# Patient Record
Sex: Female | Born: 2000 | Race: Black or African American | Hispanic: No | Marital: Single | Smoking: Former smoker
Health system: Southern US, Community
[De-identification: ages and names within clinical notes are randomized; demographics above are authoritative.]

## PROBLEM LIST (undated history)

## (undated) DIAGNOSIS — O139 Gestational [pregnancy-induced] hypertension without significant proteinuria, unspecified trimester: Secondary | ICD-10-CM

## (undated) DIAGNOSIS — N83209 Unspecified ovarian cyst, unspecified side: Secondary | ICD-10-CM

## (undated) DIAGNOSIS — T7840XA Allergy, unspecified, initial encounter: Secondary | ICD-10-CM

## (undated) DIAGNOSIS — I340 Nonrheumatic mitral (valve) insufficiency: Secondary | ICD-10-CM

## (undated) DIAGNOSIS — J45909 Unspecified asthma, uncomplicated: Secondary | ICD-10-CM

## (undated) DIAGNOSIS — E669 Obesity, unspecified: Secondary | ICD-10-CM

## (undated) DIAGNOSIS — G43909 Migraine, unspecified, not intractable, without status migrainosus: Secondary | ICD-10-CM

## (undated) HISTORY — DX: Allergy, unspecified, initial encounter: T78.40XA

## (undated) HISTORY — DX: Morbid (severe) obesity due to excess calories: E66.01

## (undated) HISTORY — PX: TYMPANOSTOMY TUBE PLACEMENT: SHX32

## (undated) HISTORY — DX: Migraine, unspecified, not intractable, without status migrainosus: G43.909

## (undated) HISTORY — DX: Obesity, unspecified: E66.9

## (undated) HISTORY — DX: Nonrheumatic mitral (valve) insufficiency: I34.0

---
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
## 2020-01-05 ENCOUNTER — Other Ambulatory Visit: Payer: Self-pay

## 2020-01-05 ENCOUNTER — Ambulatory Visit (INDEPENDENT_AMBULATORY_CARE_PROVIDER_SITE_OTHER): Payer: Medicaid Other | Admitting: Family Medicine

## 2020-01-05 ENCOUNTER — Encounter: Payer: Self-pay | Admitting: Family Medicine

## 2020-01-05 ENCOUNTER — Other Ambulatory Visit (HOSPITAL_COMMUNITY)
Admission: RE | Admit: 2020-01-05 | Discharge: 2020-01-05 | Disposition: A | Discharge status: Home / Self Care | Payer: Medicaid Other | Source: Ambulatory Visit | Attending: Family Medicine | Admitting: Family Medicine

## 2020-01-05 VITALS — BP 110/64 | HR 75 | Ht 72.0 in | Wt 260.4 lb

## 2020-01-05 DIAGNOSIS — Z113 Encounter for screening for infections with a predominantly sexual mode of transmission: Secondary | ICD-10-CM | POA: Insufficient documentation

## 2020-01-05 DIAGNOSIS — Z7689 Persons encountering health services in other specified circumstances: Secondary | ICD-10-CM

## 2020-01-05 DIAGNOSIS — K1379 Other lesions of oral mucosa: Secondary | ICD-10-CM

## 2020-01-05 DIAGNOSIS — R300 Dysuria: Secondary | ICD-10-CM | POA: Diagnosis not present

## 2020-01-05 LAB — POCT URINALYSIS DIP (MANUAL ENTRY)
Glucose, UA: NEGATIVE mg/dL
Ketones, POC UA: NEGATIVE mg/dL
Nitrite, UA: NEGATIVE
Protein Ur, POC: 100 mg/dL — AB
Spec Grav, UA: 1.025 (ref 1.010–1.025)
Urobilinogen, UA: 0.2 E.U./dL
pH, UA: 6 (ref 5.0–8.0)

## 2020-01-05 LAB — POCT UA - MICROSCOPIC ONLY: Epithelial cells, urine per micros: >20

## 2020-01-05 LAB — POCT URINE PREGNANCY: Preg Test, Ur: NEGATIVE

## 2020-01-05 NOTE — Patient Instructions (Signed)
I will follow up with you regarding the results of your testing today via telephone.  Your urine sample showed few white blood cells but given that your symptoms have resolved, I do not believe that you have a UTI at this time.

## 2020-01-05 NOTE — Progress Notes (Signed)
    SUBJECTIVE:   CHIEF COMPLAINT / HPI: desires to have STI testing  Patient states that she has not been having vaginal discharge but has had one episode of dysuria one week ago. She has also noticed bumps that appear and resolve around her lips and would like to be tested for HSV.Patient reports that she was diagnosed with chlamydia, gonorrhea and trichomoniasis, within the last two years. Has new partner, does not have concern for exposure. Has not had bumps around her lips before. She states that they begin as pimples and then she has pus that comes out. LMP 2 weeks ago and started having bleeding again yesterday. She normally has periods once per week. Last intercourse was 1 week ago, does not use condoms.   PERTINENT  PMH / PSH:  Hx of STI positive testing   OBJECTIVE:   BP 110/64   Pulse 75   Ht 6' (1.829 m)   Wt 260 lb 6.4 oz (118.1 kg)   LMP 01/05/2020   SpO2 95%   BMI 35.32 kg/m    General: female appearing stated age in no acute distress Genitalia:  Normal introitus for age, no external lesions, no vaginal discharge, mucosa pink and moist, no vaginal or cervical lesions, no vaginal atrophy, blood pooled in vaginal vault  ASSESSMENT/PLAN:   Encounter for assessment of sexually transmitted disease exposure HIV RPR  HSV 1,2  GC  Dysuria Given infrequent symptoms, do not have high suspicion for UTI but patient may have irritation to vulva or early symptoms of STI.  U/A  STI testing as listed above      Ronnald Ramp, MD [facility]

## 2020-01-07 DIAGNOSIS — Z113 Encounter for screening for infections with a predominantly sexual mode of transmission: Secondary | ICD-10-CM | POA: Insufficient documentation

## 2020-01-07 DIAGNOSIS — Z7689 Persons encountering health services in other specified circumstances: Secondary | ICD-10-CM | POA: Insufficient documentation

## 2020-01-07 LAB — HSV(HERPES SIMPLEX VRS) I + II AB-IGG
HSV 1 Glycoprotein G Ab, IgG: 52.2 index — ABNORMAL HIGH (ref 0.00–0.90)
HSV 2 IgG, Type Spec: 7.77 index — ABNORMAL HIGH (ref 0.00–0.90)

## 2020-01-07 LAB — RPR: RPR Ser Ql: NONREACTIVE

## 2020-01-07 LAB — HIV ANTIBODY (ROUTINE TESTING W REFLEX): HIV Screen 4th Generation wRfx: NONREACTIVE

## 2020-01-07 NOTE — Assessment & Plan Note (Signed)
Given infrequent symptoms, do not have high suspicion for UTI but patient may have irritation to vulva or early symptoms of STI.  U/A  STI testing as listed above

## 2020-01-07 NOTE — Assessment & Plan Note (Signed)
HIV RPR  HSV 1,2  GC

## 2020-01-08 LAB — CERVICOVAGINAL ANCILLARY ONLY
Bacterial Vaginitis (gardnerella): POSITIVE — AB
Candida Glabrata: NEGATIVE
Candida Vaginitis: NEGATIVE
Chlamydia: NEGATIVE
Comment: NEGATIVE
Comment: NEGATIVE
Comment: NEGATIVE
Comment: NEGATIVE
Comment: NEGATIVE
Comment: NORMAL
Neisseria Gonorrhea: NEGATIVE
Trichomonas: NEGATIVE

## 2020-01-09 ENCOUNTER — Other Ambulatory Visit: Payer: Self-pay | Admitting: Family Medicine

## 2020-01-09 MED ORDER — METRONIDAZOLE 500 MG PO TABS: 500.0000 mg | ORAL_TABLET | Freq: Three times a day (TID) | ORAL | 0 refills | Status: DC

## 2020-01-09 NOTE — Progress Notes (Signed)
Prescribed metronidazole for BV.

## 2020-01-15 ENCOUNTER — Telehealth: Payer: Self-pay

## 2020-01-15 NOTE — Telephone Encounter (Signed)
Patient calls nurse line requesting STD results. Patient advised of positive bacterial vaginosis and medication to pharmacy. Directions for use given. Patient advised of positive HSV type 1 and type 2. Patient reports no active lesions at this time and will call if she does. All questions answered.

## 2020-02-13 ENCOUNTER — Other Ambulatory Visit: Payer: Self-pay

## 2020-02-13 ENCOUNTER — Ambulatory Visit (INDEPENDENT_AMBULATORY_CARE_PROVIDER_SITE_OTHER): Payer: Medicaid Other | Admitting: Family Medicine

## 2020-02-13 ENCOUNTER — Encounter: Payer: Self-pay | Admitting: Family Medicine

## 2020-02-13 ENCOUNTER — Other Ambulatory Visit (HOSPITAL_COMMUNITY)
Admission: RE | Admit: 2020-02-13 | Discharge: 2020-02-13 | Disposition: A | Discharge status: Home / Self Care | Payer: Medicaid Other | Source: Ambulatory Visit | Attending: Family Medicine | Admitting: Family Medicine

## 2020-02-13 VITALS — BP 108/68 | Ht 72.0 in | Wt 267.4 lb

## 2020-02-13 DIAGNOSIS — Z113 Encounter for screening for infections with a predominantly sexual mode of transmission: Secondary | ICD-10-CM

## 2020-02-13 DIAGNOSIS — N898 Other specified noninflammatory disorders of vagina: Secondary | ICD-10-CM | POA: Diagnosis not present

## 2020-02-13 DIAGNOSIS — Z23 Encounter for immunization: Secondary | ICD-10-CM | POA: Diagnosis not present

## 2020-02-13 DIAGNOSIS — K219 Gastro-esophageal reflux disease without esophagitis: Secondary | ICD-10-CM

## 2020-02-13 DIAGNOSIS — B009 Herpesviral infection, unspecified: Secondary | ICD-10-CM

## 2020-02-13 HISTORY — DX: Herpesviral infection, unspecified: B00.9

## 2020-02-13 LAB — POCT WET PREP (WET MOUNT)
Clue Cells Wet Prep Whiff POC: NEGATIVE
Trichomonas Wet Prep HPF POC: ABSENT

## 2020-02-13 NOTE — Patient Instructions (Signed)
It was a pleasure to see you today!  Thank you for choosing [facility] for your primary care.   Our plans for today were:  For your stomach pain, use Pepcid AC. This is over the counter. You can take this 1-2 times a day or just as needed.   I will call you with the results and if anything needs to be treated.   Best Wishes,   Orpah Cobb, DO    Genital Herpes Genital herpes is a common sexually transmitted infection (STI) that is caused by a virus. The virus spreads from person to person through sexual contact. Infection can cause itching, blisters, and sores around the genitals or rectum. Symptoms may last several days and then go away This is called an outbreak. However, the virus remains in your body, so you may have more outbreaks in the future. The time between outbreaks varies and can be months or years. Genital herpes affects men and women. It is particularly concerning for pregnant women because the virus can be passed to the baby during delivery and can cause serious problems. Genital herpes is also a concern for people who have a weak disease-fighting (immune) system. What are the causes? This condition is caused by the herpes simplex virus (HSV) type 1 or type 2. The virus may spread through:  Sexual contact with an infected person, including vaginal, anal, and oral sex.  Contact with fluid from a herpes sore.  The skin. This means that you can get herpes from an infected partner even if he or she does not have a visible sore or does not know that he or she is infected. What increases the risk? You are more likely to develop this condition if:  You have sex with many partners.  You do not use latex condoms during sex. What are the signs or symptoms? Most people do not have symptoms (asymptomatic) or have mild symptoms that may be mistaken for other skin problems. Symptoms may include:  Small red bumps near the genitals, rectum, or mouth. These bumps turn  into blisters and then turn into sores.  Flu-like symptoms, including: ? Fever. ? Body aches. ? Swollen lymph nodes. ? Headache.  Painful urination.  Pain and itching in the genital area or rectal area.  Vaginal discharge.  Tingling or shooting pain in the legs and buttocks. Generally, symptoms are more severe and last longer during the first (primary) outbreak. Flu-like symptoms are also more common during the primary outbreak. How is this diagnosed? Genital herpes may be diagnosed based on:  A physical exam.  Your medical history.  Blood tests.  A test of a fluid sample (culture) from an open sore. How is this treated? There is no cure for this condition, but treatment with antiviral medicines that are taken by mouth (orally) can do the following:  Speed up healing and relieve symptoms.  Help to reduce the spread of the virus to sexual partners.  Limit the chance of future outbreaks, or make future outbreaks shorter.  Lessen symptoms of future outbreaks. Your health care provider may also recommend pain relief medicines, such as aspirin or ibuprofen. Follow these instructions at home: Sexual activity  Do not have sexual contact during active outbreaks.  Practice safe sex. Latex condoms and female condoms may help prevent the spread of the herpes virus. General instructions  Keep the affected areas dry and clean.  Take over-the-counter and prescription medicines only as told by your health care provider.  Avoid rubbing  or touching blisters and sores. If you do touch blisters or sores: ? Wash your hands thoroughly with soap and water. ? Do not touch your eyes afterward.  To help relieve pain or itching, you may take the following actions as directed by your health care provider: ? Apply a cold, wet cloth (cold compress) to affected areas 4-6 times a day. ? Apply a substance that protects your skin and reduces bleeding (astringent). ? Apply a gel that helps  relieve pain around sores (lidocaine gel). ? Take a warm, shallow bath that cleans the genital area (sitz bath).  Keep all follow-up visits as told by your health care provider. This is important. How is this prevented?  Use condoms. Although anyone can get genital herpes during sexual contact, even with the use of a condom, a condom can provide some protection.  Avoid having multiple sexual partners.  Talk with your sexual partner about any symptoms either of you may have. Also, talk with your partner about any history of STIs.  Get tested for STIs before you have sex. Ask your partner to do the same.  Do not have sexual contact if you have symptoms of genital herpes. Contact a health care provider if:  Your symptoms are not improving with medicine.  Your symptoms return.  You have new symptoms.  You have a fever.  You have abdominal pain.  You have redness, swelling, or pain in your eye.  You notice new sores on other parts of your body.  You are a woman and experience bleeding between menstrual periods.  You have had herpes and you become pregnant or plan to become pregnant. Summary  Genital herpes is a common sexually transmitted infection (STI) that is caused by the herpes simplex virus (HSV) type 1 or type 2.  These viruses are most often spread through sexual contact with an infected person.  You are more likely to develop this condition if you have sex with many partners or you have unprotected sex.  Most people do not have symptoms (asymptomatic) or have mild symptoms that may be mistaken for other skin problems. Symptoms occur as outbreaks that may happen months or years apart.  There is no cure for this condition, but treatment with oral antiviral medicines can reduce symptoms, reduce the chance of spreading the virus to a partner, prevent future outbreaks, or shorten future outbreaks. This information is not intended to replace advice given to you by your  health care provider. Make sure you discuss any questions you have with your health care provider. Document Revised: 08/23/2017 Document Reviewed: 01/17/2016 Elsevier Patient Education  2020 [employer].

## 2020-02-13 NOTE — Progress Notes (Addendum)
  Subjective    Jamie Gates is a 19 y.o. female who complains vaginal irritation with intercourse one time.  Denies abnormal vaginal bleeding, significant pelvic pain or fever. No UTI symptoms. Sexually active, uses condoms sometimes, sexually active with 2 female partner.  Last unprotected intercourse 30 days ago.  Denies history of known exposure to STD or symptoms in partner.   Patient had STD testing last month and was noted to be positive for HSV 1 and 2. She had questions and concerns.  LMP 01/22/20  No abdominal pain, nausea, or vomiting.  No fevers or chills.     Review of Systems  See HPI above for review of systems.   Objective:    BP 108/68   Ht 6' (1.829 m)   Wt 267 lb 6.4 oz (121.3 kg)   LMP  (LMP Unknown)   BMI 36.27 kg/m   Gen:  She appears well, afebrile.  Abdomen: benign, soft, nontender, no masses.  GYN:  External genitalia within normal limits.  Some thick dry white discharge all over labia minora. Vaginal mucosa pink, moist, normal rugae.  Nonfriable cervix without lesions, creamy-white/grey discharge, no bleeding noted on speculum exam.  No cervical motion tenderness.  Exam Chaperoned by: Dannette Barbara.   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  Provided education and answered all questions appropriately. Recommended to call if develops lesions for treatment.  She voiced understanding and agreement with plan.  Routine screening for STI (sexually transmitted infection) STD screening as above. Declined HIV, RPR, Hep C given recent negative results.  Vaginal irritation Wet prep notable negative. GC/Cl, trich pending.  - follow up remaining labs and treat if indicated - Self care instructions given - F/U with PCP as needed.  - Return precautions including abdominal pain, fever, chills, nausea, or vomiting given.   Gastroesophageal reflux disease Established problem. Has been using mom's Omeprazole  40mg  QD. Was interested in her own prescription. Encouraged Pepcid given less long term side effects. Can use Tums, Gaviscon, Mylanta for breakthrough pain. She is open to trying this. Follow up if inadequate symptom management.  Orders Placed This Encounter  Procedures  . POCT Wet Prep [facility])   [place], DO [facility], PGY3 02/13/2020 3:01 PM

## 2020-02-13 NOTE — Assessment & Plan Note (Signed)
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  Provided education and answered all questions appropriately. Recommended to call if develops lesions for treatment.  She voiced understanding and agreement with plan.

## 2020-02-13 NOTE — Assessment & Plan Note (Addendum)
Wet prep notable negative. GC/Cl, trich pending.  - follow up remaining labs and treat if indicated - Self care instructions given - F/U with PCP as needed.  - Return precautions including abdominal pain, fever, chills, nausea, or vomiting given.

## 2020-02-13 NOTE — Assessment & Plan Note (Signed)
STD screening as above. Declined HIV, RPR, Hep C given recent negative results.

## 2020-02-13 NOTE — Assessment & Plan Note (Signed)
Established problem. Has been using mom's Omeprazole 40mg  QD. Was interested in her own prescription. Encouraged Pepcid given less long term side effects. Can use Tums, Gaviscon, Mylanta for breakthrough pain. She is open to trying this. Follow up if inadequate symptom management.

## 2020-02-14 LAB — CERVICOVAGINAL ANCILLARY ONLY
Chlamydia: NEGATIVE
Comment: NEGATIVE
Comment: NEGATIVE
Comment: NORMAL
Neisseria Gonorrhea: NEGATIVE
Trichomonas: NEGATIVE

## 2020-02-15 ENCOUNTER — Encounter: Payer: Self-pay | Admitting: Family Medicine

## 2020-03-15 ENCOUNTER — Telehealth: Payer: Self-pay

## 2020-03-15 MED ORDER — VALACYCLOVIR HCL 1 G PO TABS: 1000.0000 mg | ORAL_TABLET | Freq: Every day | ORAL | 0 refills | Status: AC

## 2020-03-15 NOTE — Telephone Encounter (Signed)
Rx for valtrex called in to patient's pharmacy. Attempted to contact patient via telephone to discuss symptoms with no answer. Left voicemail for patient to schedule appt with [facility] if symptoms do not improve with Rx.   Ronnald Ramp, MD [facility], PGY-2 [phone]

## 2020-03-15 NOTE — Telephone Encounter (Signed)
Patient calls nurse line requesting Valtrex for current HSV outbreak. This medication is not on current medication list.   Please advise  Veronda Prude, RN

## 2020-09-17 DIAGNOSIS — Z3200 Encounter for pregnancy test, result unknown: Secondary | ICD-10-CM | POA: Diagnosis not present

## 2020-09-17 DIAGNOSIS — Z3009 Encounter for other general counseling and advice on contraception: Secondary | ICD-10-CM | POA: Diagnosis not present

## 2020-09-18 ENCOUNTER — Ambulatory Visit (INDEPENDENT_AMBULATORY_CARE_PROVIDER_SITE_OTHER): Payer: Medicaid Other | Admitting: Family Medicine

## 2020-09-18 ENCOUNTER — Encounter: Payer: Self-pay | Admitting: Family Medicine

## 2020-09-18 ENCOUNTER — Other Ambulatory Visit: Payer: Self-pay

## 2020-09-18 ENCOUNTER — Other Ambulatory Visit: Payer: Self-pay | Admitting: Family Medicine

## 2020-09-18 VITALS — BP 109/60 | HR 99 | Wt 272.8 lb

## 2020-09-18 DIAGNOSIS — Z3201 Encounter for pregnancy test, result positive: Secondary | ICD-10-CM

## 2020-09-18 DIAGNOSIS — N912 Amenorrhea, unspecified: Secondary | ICD-10-CM | POA: Diagnosis not present

## 2020-09-18 LAB — OB RESULTS CONSOLE GBS: GBS: POSITIVE

## 2020-09-18 LAB — POCT URINE PREGNANCY: Preg Test, Ur: POSITIVE — AB

## 2020-09-18 MED ORDER — COMPLETENATE 29-1 MG PO CHEW: 1.0000 | CHEWABLE_TABLET | Freq: Every day | ORAL | 6 refills | Status: DC

## 2020-09-18 NOTE — Patient Instructions (Signed)
You were seen today for a positive home pregnancy test. Here pregnancy test today was also positive.  Congratulations on your new pregnancy.  We have schedule an appointment for you for your new OB visit.  Today we will obtain lab work and you will review this with the provider at your next appointment.  I have also attached a list of medications that are safe in pregnancy.  We will forward to caring for you during this exciting time.  Dr. Neita Garnet

## 2020-09-18 NOTE — Progress Notes (Signed)
    SUBJECTIVE:   CHIEF COMPLAINT / HPI: concern for pregnancy   Patient states her LMP was 07/29/2020 Patient reports taking home urine pregnancy test on 7/5 with positive results. She denies taking any contraception at this time of unprotected intercourse She reports that if test is positive again today, this is a desired pregnancy.  Patient denies any family history of preeclampsia but does report placenta previa and her mother. Patient has had no prior pregnancies.  PERTINENT  PMH / PSH:  Obesity GERD History of STI  OBJECTIVE:   BP 109/60   Pulse 99   Wt 272 lb 12.8 oz (123.7 kg)   LMP 07/29/2020   SpO2 99%   BMI 37.00 kg/m   General: female appearing stated age in no acute distress Cardio: Normal S1 and S2, no S3 or S4. Rhythm is regular thank you bye-bye 4 minutes BiPAP Marylu Lund on, finished. No murmurs or rubs.  Bilateral radial pulses palpable Pulm: Clear to auscultation bilaterally, no crackles, wheezing, or diminished breath sounds. Normal respiratory effort, stable on RA Abdomen: Bowel sounds normal. Abdomen soft and non-tender.  Extremities: No peripheral edema. Warm/ well perfused.  Neuro: pt alert and oriented x4    ASSESSMENT/PLAN:   Positive pregnancy test Urine pregnancy test positive OB labs ordered OB urine culture Patient is scheduled for new OB visit Prescribed prenatal vitamins  Dating [place] ordered    F/u as scheduled   Ronnald Ramp, MD [facility]

## 2020-09-20 ENCOUNTER — Encounter: Payer: Self-pay | Admitting: Family Medicine

## 2020-09-20 DIAGNOSIS — Z3201 Encounter for pregnancy test, result positive: Secondary | ICD-10-CM | POA: Insufficient documentation

## 2020-09-20 LAB — HGB FRACTIONATION CASCADE
Hgb A2: 2.6 % (ref 1.8–3.2)
Hgb A: 97.4 % (ref 96.4–98.8)
Hgb F: 0 % (ref 0.0–2.0)
Hgb S: 0 %

## 2020-09-20 LAB — AB SCR+ANTIBODY ID: Antibody Screen: POSITIVE — AB

## 2020-09-20 LAB — OBSTETRIC PANEL, INCLUDING HIV
Basophils Absolute: 0 10*3/uL (ref 0.0–0.2)
Basos: 0 %
EOS (ABSOLUTE): 0.1 10*3/uL (ref 0.0–0.4)
Eos: 1 %
HIV Screen 4th Generation wRfx: NONREACTIVE
Hematocrit: 36 % (ref 34.0–46.6)
Hemoglobin: 11.7 g/dL (ref 11.1–15.9)
Hepatitis B Surface Ag: NEGATIVE
Immature Grans (Abs): 0 10*3/uL (ref 0.0–0.1)
Immature Granulocytes: 0 %
Lymphocytes Absolute: 2.4 10*3/uL (ref 0.7–3.1)
Lymphs: 28 %
MCH: 29 pg (ref 26.6–33.0)
MCHC: 32.5 g/dL (ref 31.5–35.7)
MCV: 89 fL (ref 79–97)
Monocytes Absolute: 0.4 10*3/uL (ref 0.1–0.9)
Monocytes: 5 %
Neutrophils Absolute: 5.6 10*3/uL (ref 1.4–7.0)
Neutrophils: 66 %
Platelets: 212 10*3/uL (ref 150–450)
RBC: 4.03 x10E6/uL (ref 3.77–5.28)
RDW: 13.2 % (ref 11.7–15.4)
RPR Ser Ql: NONREACTIVE
Rh Factor: NEGATIVE
Rubella Antibodies, IGG: 4.39 index (ref >0.99)
WBC: 8.6 10*3/uL (ref 3.4–10.8)

## 2020-09-20 NOTE — Assessment & Plan Note (Addendum)
Urine pregnancy test positive OB labs ordered OB urine culture Patient is scheduled for new OB visit Prescribed prenatal vitamins  Dating [place] ordered

## 2020-09-23 ENCOUNTER — Other Ambulatory Visit: Payer: Self-pay | Admitting: Family Medicine

## 2020-09-23 DIAGNOSIS — R8271 Bacteriuria: Secondary | ICD-10-CM

## 2020-09-23 DIAGNOSIS — Z8619 Personal history of other infectious and parasitic diseases: Secondary | ICD-10-CM | POA: Insufficient documentation

## 2020-09-23 LAB — URINE CULTURE, OB REFLEX

## 2020-09-23 LAB — CULTURE, OB URINE

## 2020-09-23 MED ORDER — AMOXICILLIN 500 MG PO CAPS: 500.0000 mg | ORAL_CAPSULE | Freq: Three times a day (TID) | ORAL | 0 refills | Status: AC

## 2020-09-23 MED ORDER — AMOXICILLIN 500 MG PO CAPS: 500.0000 mg | ORAL_CAPSULE | Freq: Three times a day (TID) | ORAL | 0 refills | Status: DC

## 2020-09-23 NOTE — Assessment & Plan Note (Signed)
Treat with Amox 500mg  TID x7 days  Will need TOC at follow up visit

## 2020-09-23 NOTE — Progress Notes (Signed)
Contacted patient to notify her of GBS urine culture. Informed her that we have prescribed Amox TID for 7 day course. Also informed patient of need for TOC at follow up appt in addition to positive antibody titer. Questions were answered. Patient requested RX sent to [facility] on [address] instead of [facility]. Pharmacy updated.

## 2020-09-26 ENCOUNTER — Telehealth: Payer: Self-pay

## 2020-09-26 NOTE — Telephone Encounter (Signed)
Patient calls nurse line requesting rx refill on valtrex due to current outbreak.   Please advise if medication can be sent to [facility] on [address].   Veronda Prude, RN

## 2020-09-27 ENCOUNTER — Other Ambulatory Visit: Payer: Self-pay | Admitting: Family Medicine

## 2020-09-27 MED ORDER — VALACYCLOVIR HCL 500 MG PO TABS: 500.0000 mg | ORAL_TABLET | Freq: Two times a day (BID) | ORAL | 0 refills | Status: AC

## 2020-09-27 NOTE — Telephone Encounter (Signed)
RX sent for valtrex 500mg  BID for three day course.  , MD [facility] Family Medicine, PGY-3 [phone]

## 2020-09-30 ENCOUNTER — Other Ambulatory Visit: Payer: Self-pay

## 2020-09-30 ENCOUNTER — Encounter: Payer: Self-pay | Admitting: Family Medicine

## 2020-09-30 ENCOUNTER — Ambulatory Visit (INDEPENDENT_AMBULATORY_CARE_PROVIDER_SITE_OTHER): Payer: Medicaid Other | Admitting: Family Medicine

## 2020-09-30 VITALS — BP 90/56 | HR 87 | Wt 273.1 lb

## 2020-09-30 DIAGNOSIS — O99111 Other diseases of the blood and blood-forming organs and certain disorders involving the immune mechanism complicating pregnancy, first trimester: Secondary | ICD-10-CM | POA: Diagnosis not present

## 2020-09-30 DIAGNOSIS — I517 Cardiomegaly: Secondary | ICD-10-CM | POA: Diagnosis not present

## 2020-09-30 DIAGNOSIS — F4321 Adjustment disorder with depressed mood: Secondary | ICD-10-CM

## 2020-09-30 DIAGNOSIS — Z3A09 9 weeks gestation of pregnancy: Secondary | ICD-10-CM

## 2020-09-30 DIAGNOSIS — R8271 Bacteriuria: Secondary | ICD-10-CM | POA: Diagnosis not present

## 2020-09-30 DIAGNOSIS — O98511 Other viral diseases complicating pregnancy, first trimester: Secondary | ICD-10-CM | POA: Diagnosis not present

## 2020-09-30 DIAGNOSIS — B009 Herpesviral infection, unspecified: Secondary | ICD-10-CM | POA: Diagnosis not present

## 2020-09-30 DIAGNOSIS — Z3491 Encounter for supervision of normal pregnancy, unspecified, first trimester: Secondary | ICD-10-CM

## 2020-09-30 DIAGNOSIS — Z3490 Encounter for supervision of normal pregnancy, unspecified, unspecified trimester: Secondary | ICD-10-CM | POA: Insufficient documentation

## 2020-09-30 DIAGNOSIS — Z3401 Encounter for supervision of normal first pregnancy, first trimester: Secondary | ICD-10-CM

## 2020-09-30 DIAGNOSIS — O99330 Smoking (tobacco) complicating pregnancy, unspecified trimester: Secondary | ICD-10-CM

## 2020-09-30 DIAGNOSIS — R760 Raised antibody titer: Secondary | ICD-10-CM | POA: Diagnosis not present

## 2020-09-30 DIAGNOSIS — O99331 Smoking (tobacco) complicating pregnancy, first trimester: Secondary | ICD-10-CM | POA: Diagnosis present

## 2020-09-30 HISTORY — DX: Adjustment disorder with depressed mood: F43.21

## 2020-09-30 HISTORY — DX: Raised antibody titer: R76.0

## 2020-09-30 NOTE — Assessment & Plan Note (Signed)
Patient had incidental finding on chest x-ray as a 20 year old.  She was referred to [facility] pediatric cardiology who did EKG and echo which were both normal.  She declines any symptoms today.  In absence of symptoms work-up likely to not be useful.  Will discuss with obstetric preceptors to assess for further work-up.

## 2020-09-30 NOTE — Assessment & Plan Note (Signed)
Has 1 dose left of amoxicillin. Will not need GBS swab in third trimester. Clindamycin resistant.

## 2020-09-30 NOTE — Progress Notes (Signed)
Patient Name: Jamie Gates Date of Birth: 21-Feb-2001 [facility] Initial Prenatal Visit  Jamie Gates is a 20 y.o. year old No obstetric history on file. at Unknown who presents for her initial prenatal visit. Pregnancy is planned She reports breast tenderness, nausea, and positive home pregnancy test. She is taking a prenatal vitamin.  She denies pelvic pain or vaginal bleeding.   Pregnancy Dating: The patient is dated by LMP.  LMP: 07/29/2020 Period is certain:  Yes.  Periods were regular:  Yes.  LMP was a typical period:  Yes.  Using hormonal contraception in 3 months prior to conception: No  Lab Review: Blood type: B Rh Status: - Antibody screen: weakly positive, unable to type. Lab recommends repeat antibody screen in 4 weeks.  HIV: Negative RPR: Negative Hemoglobin electrophoresis reviewed: Yes WNL Results of OB urine culture are: Positive for GBS, resistant to clindamycin. Rubella: Immune Hep C Ab: Negative Varicella status is Not known- will check [facility]. Patient has not had chickenpox.  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  Summary of prior pregnancies: G1P0 Cesarean delivery: No  Gestational Diabetes:  No Hypertension in pregnancy: No History of preterm birth: No History of LGA/SGA infant:  No History of shoulder dystocia: No Indications for referral were reviewed, and the patient has no obstetric indications for referral to High [facility] at this time.   Social History: Partner's name: Jamie Gates  Tobacco use: No Alcohol use:  No Other substance use:  No  Current  Medications:  Prenatal vitamins, amoxicillin for GBS bacteriuria  Reviewed and appropriate in pregnancy.   Genetic and Infection Screen: Flow Sheet Updated Yes  Prenatal Exam: Gen: Well nourished, well developed.  No distress.  Vitals noted. HEENT: Normocephalic, atraumatic.  Neck supple without cervical lymphadenopathy, thyromegaly or thyroid nodules.  Fair dentition. CV: RRR no murmur, gallops or rubs Lungs: CTA B.  Normal respiratory effort without wheezes or rales. Abd: soft, NTND. +BS.  Uterus not appreciated above pelvis. Ext: No clubbing, cyanosis or edema. Psych: Normal grooming and dress.  Not depressed or anxious appearing.  Normal thought content and process without flight of ideas or looseness of associations  Fetal heart tones: not indicated as <10 weeks of gestation  Assessment/Plan:  Jamie Gates is a 20 y.o. No obstetric history on file. at Unknown who presents to initiate prenatal care. She is doing well.  Current pregnancy issues include the following.  Routine prenatal care: As dating is reliable, a dating ultrasound has not been ordered. Dating tab updated. Pre-pregnancy weight updated. Expected weight gain this pregnancy is 11-20 pounds  Prenatal labs reviewed, notable for antibody screen positive, GBS bacteriuria. Indications for referral to HR[facility] were reviewed and the patient does not meet criteria for referral.  Medication list reviewed and updated.  Recommended patient see a dentist for regular care.  Bleeding and pain precautions reviewed. Importance of prenatal vitamins reviewed.  Genetic screening offered. Patient opted for: first trimester screen with nuchal translucency at 11-13 weeks. The patient does have an indication for aspirin therapy beginning at 12-16 weeks. Aspirin was  recommended today.  The patient  will not be age 74 or over at time of delivery. Referral to genetic counseling was not offered today.  The patient has the following risk  factors for preexisting diabetes: BMI > 25 and high risk ethnicity (Latino, [place] American, Native American, [place] Islander, Asian Naval architect) . An early 1 hour glucose tolerance test was ordered. Pregnancy Medical Home and PHQ-9 forms completed, problems noted: Yes  2. Pregnancy issues include the following which were addressed today:  GBS bacteriuria: treated with amoxicillin, patient completed treatment course. Clindamycin resistant.  Antibody screen positive: repeat abs screen in 4 weeks at next visit. History of cardiomegaly: Noted incidentally on CXR. Previously saw ped cardiology at [facility], normal echo. No structural or other cause for cardiomegaly at that time. In absence of symptoms, likely no cause for concern. However will precept with prenatal faculty; could consider referral to Dr. Servando Salina who cares for cardiac diseases of pregnancy. HSV: h/o HSV, will be on ppx at 36w. Do not discuss in front of partner. Elevated PHQ-9: patient is very excited about pregnancy, but reports being very concerned about her living situation as she does not have a job and lives with her family. PHQ-9 elevated to 16, denies SI, no history of SI attempt. Because symptoms of worry, low mood, problems sleeping, fatigue (likely pregnancy), feeling down about herself have started in the last week since she found out she was pregnant, most likely this is adjustment disorder at this time. Patient is very interested in therapy. Will refer her to Dr. Shawnee Knapp as she may be good candidate for this, will give therapy resources outside of [facility] if not.   Follow up 4 weeks for next prenatal visit.

## 2020-09-30 NOTE — Assessment & Plan Note (Signed)
Antibody screen and routine obstetric lab shows a weak positive.  Lab recommends repeating antibody screen in 4 weeks at next visit on September 1.

## 2020-09-30 NOTE — Assessment & Plan Note (Signed)
Currently on valtrex, will need ppx at 36 weeks.

## 2020-09-30 NOTE — Patient Instructions (Addendum)
It was a pleasure to see you today! Congratulations on your pregnancy. You need to take prenatal vitamins each day. Continue to stay away from cigarettes! Also, avoid raw meats and cheeses (ok if pasteurized) as well as cat litter. You can have one serving of tuna/swordfish/or shark per week, you can have deli meat like ham or [place] if you heat it in microwave until steaming.  Go to the [facility] at [facility] if: You have pain in your lower abdomen or pelvic area You have vaginal bleeding.  2. You should receive a phone call to schedule the genetic screening with ultrasound with Maternal Fetal Medicine.  3. I recommend you start aspirin at your next visit (between 12-16 weeks). Keep taking the daily prenatal vitamins  4. Look into classes at [email or web address]. Consider if you want to breast feed, bottle feed, or both.  5. I will refer you for therapy, you should hear from our psychologist, Dr. Shawnee Knapp, within the next few days.  Follow up in 1 month  Be Well,  Dr. Leary Roca  Safe Medications in Pregnancy   Acne: Benzoyl Peroxide Salicylic Acid  Backache/Headache: Tylenol: 2 regular strength every 4 hours OR              2 Extra strength every 6 hours  Colds/Coughs/Allergies: Benadryl (alcohol free) 25 mg every 6 hours as needed Breath right strips Claritin Cepacol throat lozenges Chloraseptic throat spray Cold-Eeze- up to three times per day Cough drops, alcohol free Flonase (by prescription only) Guaifenesin Mucinex Robitussin DM (plain only, alcohol free) Saline nasal spray/drops Sudafed (pseudoephedrine) & Actifed ** use only after [redacted] weeks gestation and if you do not have high blood pressure Tylenol Vicks Vaporub Zinc lozenges Zyrtec   Constipation: Colace Ducolax suppositories Fleet enema Glycerin suppositories Metamucil Milk of magnesia Miralax Senokot Smooth move tea  Diarrhea: Kaopectate Imodium A-D  *NO pepto  Bismol  Hemorrhoids: Anusol Anusol HC Preparation H Tucks  Indigestion: Tums Maalox Mylanta Zantac  Pepcid  Insomnia: Benadryl (alcohol free) 25mg  every 6 hours as needed Tylenol PM Unisom, no Gelcaps  Leg Cramps: Tums MagGel  Nausea/Vomiting:  Bonine Dramamine Emetrol Ginger extract Sea bands Meclizine  Nausea medication to take during pregnancy:  Unisom (doxylamine succinate 25 mg tablets) Take one tablet daily at bedtime. If symptoms are not adequately controlled, the dose can be increased to a maximum recommended dose of two tablets daily (1/2 tablet in the morning, 1/2 tablet mid-afternoon and one at bedtime). Vitamin B6 100mg  tablets. Take one tablet twice a day (up to 200 mg per day).  Skin Rashes: Aveeno products Benadryl cream or 25mg  every 6 hours as needed Calamine Lotion 1% cortisone cream  Yeast infection: Gyne-lotrimin 7 Monistat 7   **If taking multiple medications, please check labels to avoid duplicating the same active ingredients **take medication as directed on the label ** Do not exceed 4000 mg of tylenol in 24 hours **Do not take medications that contain aspirin or ibuprofen

## 2020-09-30 NOTE — Assessment & Plan Note (Signed)
patient is very excited about pregnancy, but reports being very concerned about her living situation as she does not have a job and lives with her family. PHQ-9 elevated to 16, denies SI, no history of SI attempt. Because symptoms of worry, low mood, problems sleeping, fatigue (likely pregnancy), feeling down about herself have started in the last week since she found out she was pregnant, most likely this is adjustment disorder at this time. Patient is very interested in therapy. Will refer her to Dr. Shawnee Knapp as she may be good candidate for this, will give therapy resources outside of [facility] if not.

## 2020-10-01 ENCOUNTER — Telehealth: Payer: Self-pay | Admitting: Psychology

## 2020-10-01 NOTE — Telephone Encounter (Signed)
-----   Message from Shirlean Mylar, MD sent at 09/30/2020  6:33 PM EDT ----- Dr. Shawnee Knapp, would this be a good patient for you? She strikes me as adjustment disorder.   Dr. Miquel Dunn- no cardiac symptoms, previously cleared by cardiology for incidental finding of cardiomegaly as child. Should further work up be done? Thanks

## 2020-10-01 NOTE — Telephone Encounter (Signed)
Called and LVM regarding setting up [facility] appt

## 2020-10-03 NOTE — Telephone Encounter (Signed)
Called and LVM (second attempt).

## 2020-10-07 ENCOUNTER — Telehealth: Payer: Self-pay | Admitting: Family Medicine

## 2020-10-07 ENCOUNTER — Telehealth: Payer: Self-pay

## 2020-10-07 ENCOUNTER — Other Ambulatory Visit: Payer: Self-pay | Admitting: Family Medicine

## 2020-10-07 ENCOUNTER — Other Ambulatory Visit: Payer: Self-pay | Admitting: *Deleted

## 2020-10-07 DIAGNOSIS — Z3491 Encounter for supervision of normal pregnancy, unspecified, first trimester: Secondary | ICD-10-CM

## 2020-10-07 DIAGNOSIS — R760 Raised antibody titer: Secondary | ICD-10-CM

## 2020-10-07 DIAGNOSIS — I517 Cardiomegaly: Secondary | ICD-10-CM

## 2020-10-07 NOTE — Telephone Encounter (Signed)
Patients needs prior authorization for procedure before it can be scheduled   PCP notified.  Glennie Hawk, CMA

## 2020-10-07 NOTE — Telephone Encounter (Signed)
Contacted patient via telephone in order to schedule lab visit to have repeat antibody screen.  Left voicemail explaining need for repeat lab work as initial antibody screen had an abnormal result.  Recommended that the patient call the office to schedule a lab appointment as the lab for this repeat antibody screen is already ordered. If patient calls, please schedule for lab appointment this week.   Ronnald Ramp, MD [facility] Family Medicine, PGY-3 [phone]

## 2020-10-29 ENCOUNTER — Encounter: Payer: Self-pay | Admitting: *Deleted

## 2020-10-29 ENCOUNTER — Ambulatory Visit: Payer: Medicaid Other | Attending: Family Medicine

## 2020-10-29 ENCOUNTER — Other Ambulatory Visit: Payer: Self-pay | Admitting: *Deleted

## 2020-10-29 ENCOUNTER — Ambulatory Visit: Payer: Medicaid Other | Admitting: *Deleted

## 2020-10-29 ENCOUNTER — Other Ambulatory Visit: Payer: Self-pay

## 2020-10-29 ENCOUNTER — Ambulatory Visit: Payer: Medicaid Other

## 2020-10-29 VITALS — BP 107/64 | HR 99 | Wt 274.0 lb

## 2020-10-29 DIAGNOSIS — Z3491 Encounter for supervision of normal pregnancy, unspecified, first trimester: Secondary | ICD-10-CM

## 2020-10-29 DIAGNOSIS — Z3481 Encounter for supervision of other normal pregnancy, first trimester: Secondary | ICD-10-CM | POA: Insufficient documentation

## 2020-10-29 DIAGNOSIS — O99331 Smoking (tobacco) complicating pregnancy, first trimester: Secondary | ICD-10-CM

## 2020-10-29 DIAGNOSIS — Z3682 Encounter for antenatal screening for nuchal translucency: Secondary | ICD-10-CM | POA: Diagnosis not present

## 2020-10-29 NOTE — Progress Notes (Unsigned)
t

## 2020-10-30 NOTE — Progress Notes (Deleted)
  Patient Name: Jamie Gates Date of Birth: 30-Jun-2000 [facility] Prenatal Visit  Phil Corti is a 20 y.o. G1P0 at [redacted]w[redacted]d here for routine follow up.  She is dated by LMP.   She reports {symptoms; pregnancy related:14538}.  She denies vaginal bleeding.   See flow sheet for details.  There were no vitals filed for this visit. General: female appearing stated age in no acute distress Cardio: Normal S1 and S2, no S3 or S4. Rhythm is regular***. No murmurs or rubs.  Bilateral radial pulses palpable Pulm: Clear to auscultation bilaterally, no crackles, wheezing, or diminished breath sounds. Normal respiratory effort, stable on *** Abdomen: Bowel sounds normal. Abdomen soft and non-tender. *** Extremities: No peripheral edema. Warm/ well perfused. ***   A/P: Pregnancy at [redacted]w[redacted]d.  Doing well.    Routine Prenatal Care:  Dating reviewed, dating tab is correct Fetal heart tones {appropriate:23337} Influenza vaccine not administered as not influenza season.  Recommend at next visit in 4 weeks.  COVID vaccination was discussed and ***.  The patient has the following indication for screening preexisting diabetes: BMI > 25 and high risk ethnicity (Latino, [place] American, Native American, [place] Islander, Asian Naval architect) . Anatomy ultrasound ordered to be scheduled at 18-20 weeks. Patient is interested in genetic screening. Pending maternity 21 results  Pregnancy education including expected  weight gain in pregnancy OTC medication use*** continued use of prenatal vitamin smoking cessation if applicable nutrition in pregnancy.   Bleeding and pain precautions reviewed.  2. Pregnancy issues include the following and were addressed as appropriate today:  +Antibody screen: will repeat antibody screen today  Patient is to start ASA 81mg  daily  Patient is to continue PNV daily  Anatomy scan ordered for patient to have at 18 weeks***  Hx of cardiomegaly, order for echo  placed, scheduled ***  Problem list  and pregnancy box updated: {yes/no:20286::"Yes"}.   Follow up 4 weeks around 11/27/20

## 2020-10-31 ENCOUNTER — Telehealth: Payer: Self-pay | Admitting: Family Medicine

## 2020-10-31 ENCOUNTER — Encounter: Payer: Medicaid Other | Admitting: Family Medicine

## 2020-10-31 NOTE — Telephone Encounter (Signed)
Contacted patient in order to schedule for echocardiogram as well as anatomy ultrasound.  Patient had appointment scheduled for today 10/31/2020 but did not show for this appointment.  Please have patient reschedule for her OB follow-up as soon as possible.  Please notify patient of anatomy ultrasound appointment on 12/17/2020 at 8:15 [facility].  Please notify patient of appointment for echocardiogram for history of cardiomegaly in the [facility] echo lab on 11/20/2020 at 12:45 PM.  Ronnald Ramp, MD [facility] Family Medicine, PGY-3 [phone]

## 2020-11-03 LAB — MATERNIT21 PLUS CORE+SCA
Fetal Fraction: 8
Monosomy X (Turner Syndrome): NOT DETECTED
Result (T21): NEGATIVE
Trisomy 13 (Patau syndrome): NEGATIVE
Trisomy 18 (Edwards syndrome): NEGATIVE
Trisomy 21 (Down syndrome): NEGATIVE
XXX (Triple X Syndrome): NOT DETECTED
XXY (Klinefelter Syndrome): NOT DETECTED
XYY (Jacobs Syndrome): NOT DETECTED

## 2020-11-05 ENCOUNTER — Telehealth: Payer: Self-pay | Admitting: Obstetrics and Gynecology

## 2020-11-05 NOTE — Telephone Encounter (Signed)
The patient was informed of the results of her recent MaterniT21 testing which yielded NEGATIVE results.  The patient's specimen showed DNA consistent with two copies of chromosomes 21, 18 and 13.  The sensitivity for trisomy 21, trisomy 18 and trisomy 13 using this testing are reported as 99.1%, 99.9% and 91.7% respectively.  Thus, while the results of this testing are highly accurate, they are not considered diagnostic at this time.  Should more definitive information be desired, the patient may still consider amniocentesis.   As requested to know by the patient, sex chromosome analysis was included for this sample.  Results are consistent with a female fetus. This is predicted with >99% accuracy. This testing also screens for sex chromosome conditions with greater than 96% accuracy and was negative for those conditions.   A maternal serum AFP only should be considered if screening for neural tube defects is desired.  We may be reached at [phone] with any questions or concerns.  Jamie Hanover F. Zyron Deeley, MS, CGC  

## 2020-11-11 ENCOUNTER — Ambulatory Visit (INDEPENDENT_AMBULATORY_CARE_PROVIDER_SITE_OTHER): Payer: Medicaid Other

## 2020-11-11 ENCOUNTER — Other Ambulatory Visit: Payer: Self-pay

## 2020-11-11 DIAGNOSIS — Z111 Encounter for screening for respiratory tuberculosis: Secondary | ICD-10-CM

## 2020-11-11 NOTE — Progress Notes (Signed)
Patient is here for a PPD placement.  PPD placed in left forearm @ 0920 am.  Patient will return 11/13/2020 to have PPD read. Veronda Prude, RN

## 2020-11-13 ENCOUNTER — Other Ambulatory Visit: Payer: Self-pay

## 2020-11-13 ENCOUNTER — Ambulatory Visit (INDEPENDENT_AMBULATORY_CARE_PROVIDER_SITE_OTHER): Payer: Medicaid Other

## 2020-11-13 DIAGNOSIS — Z111 Encounter for screening for respiratory tuberculosis: Secondary | ICD-10-CM

## 2020-11-13 LAB — TB SKIN TEST
Induration: 0 mm
TB Skin Test: NEGATIVE

## 2020-11-13 NOTE — Progress Notes (Signed)
Patient is here for a PPD read.  It was placed on 11/11/2020 in the left forearm @ 0920 am.    PPD RESULTS:  Result: negative Induration: 0 mm  Letter created and given to patient for documentation purposes. Veronda Prude, RN

## 2020-11-20 ENCOUNTER — Ambulatory Visit (HOSPITAL_COMMUNITY)
Admission: RE | Admit: 2020-11-20 | Discharge: 2020-11-20 | Disposition: A | Discharge status: Home / Self Care | Payer: Medicaid Other | Source: Ambulatory Visit | Attending: Family Medicine | Admitting: Family Medicine

## 2020-11-20 ENCOUNTER — Other Ambulatory Visit: Payer: Self-pay

## 2020-11-20 ENCOUNTER — Ambulatory Visit (INDEPENDENT_AMBULATORY_CARE_PROVIDER_SITE_OTHER): Payer: Medicaid Other | Admitting: Family Medicine

## 2020-11-20 VITALS — BP 100/58 | HR 69 | Wt 274.0 lb

## 2020-11-20 DIAGNOSIS — Z3A16 16 weeks gestation of pregnancy: Secondary | ICD-10-CM | POA: Insufficient documentation

## 2020-11-20 DIAGNOSIS — I517 Cardiomegaly: Secondary | ICD-10-CM

## 2020-11-20 DIAGNOSIS — Z3491 Encounter for supervision of normal pregnancy, unspecified, first trimester: Secondary | ICD-10-CM

## 2020-11-20 DIAGNOSIS — I5030 Unspecified diastolic (congestive) heart failure: Secondary | ICD-10-CM

## 2020-11-20 DIAGNOSIS — Z3401 Encounter for supervision of normal first pregnancy, first trimester: Secondary | ICD-10-CM

## 2020-11-20 DIAGNOSIS — O99412 Diseases of the circulatory system complicating pregnancy, second trimester: Secondary | ICD-10-CM | POA: Insufficient documentation

## 2020-11-20 DIAGNOSIS — I519 Heart disease, unspecified: Secondary | ICD-10-CM | POA: Diagnosis not present

## 2020-11-20 DIAGNOSIS — Z3492 Encounter for supervision of normal pregnancy, unspecified, second trimester: Secondary | ICD-10-CM

## 2020-11-20 LAB — ECHOCARDIOGRAM COMPLETE
Area-P 1/2: 3.77 cm2
S' Lateral: 5 cm

## 2020-11-20 MED ORDER — NATACHEW 28-1 MG PO CHEW: CHEWABLE_TABLET | ORAL | 3 refills | Status: DC

## 2020-11-20 NOTE — Patient Instructions (Addendum)
Thank you for coming to see me today. It was a pleasure.    Please follow-up with in 4 weeks  If you have any questions or concerns, please do not hesitate to call the office at [phone].  Best,   Dana Allan, MD    Second Trimester of Pregnancy The second trimester of pregnancy is from week 13 through week 27. This is also called months 4 through 6 of pregnancy. This is often the time when you feel your best. During the second trimester: Morning sickness is less or has stopped. You may have more energy. You may feel hungry more often. At this time, your unborn baby (fetus) is growing very fast. At the end of the sixth month, the unborn baby may be up to 12 inches long and weigh about 1 pounds. You will likely start to feel the baby move between 16 and 20 weeks of pregnancy. Body changes during your second trimester Your body continues to go through many changes during this time. The changes vary and generally return to normal after the baby is born. Physical changes You will gain more weight. You may start to get stretch marks on your hips, belly (abdomen), and breasts. Your breasts will grow and may hurt. Dark spots or blotches may develop on your face. A dark line from your belly button to the pubic area (linea nigra) may appear. You may have changes in your hair. Health changes You may have headaches. You may have heartburn. You may have trouble pooping (constipation). You may have hemorrhoids or swollen, bulging veins (varicose veins). Your gums may bleed. You may pee (urinate) more often. You may have back pain. Follow these instructions at home: Medicines Take over-the-counter and prescription medicines only as told by your doctor. Some medicines are not safe during pregnancy. Take a prenatal vitamin that contains at least 600 micrograms (mcg) of folic acid. Eating and drinking Eat healthy meals that include: Fresh fruits and vegetables. Whole grains. Good  sources of protein, such as meat, eggs, or tofu. Low-fat dairy products. Avoid raw meat and unpasteurized juice, milk, and cheese. You may need to take these actions to prevent or treat trouble pooping: Drink enough fluids to keep your pee (urine) pale yellow. Eat foods that are high in fiber. These include beans, whole grains, and fresh fruits and vegetables. Limit foods that are high in fat and sugar. These include fried or sweet foods. Activity Exercise only as told by your doctor. Most people can do their usual exercise during pregnancy. Try to exercise for 30 minutes at least 5 days a week. Stop exercising if you have pain or cramps in your belly or lower back. Do not exercise if it is too hot or too humid, or if you are in a place of great height (high altitude). Avoid heavy lifting. If you choose to, you may have sex unless your doctor tells you not to. Relieving pain and discomfort Wear a good support bra if your breasts are sore. Take warm water baths (sitz baths) to soothe pain or discomfort caused by hemorrhoids. Use hemorrhoid cream if your doctor approves. Rest with your legs raised (elevated) if you have leg cramps or low back pain. If you develop bulging veins in your legs: Wear support hose as told by your doctor. Raise your feet for 15 minutes, 3-4 times a day. Limit salt in your food. Safety Wear your seat belt at all times when you are in a car. Talk with your doctor  if someone is hurting you or yelling at you a lot. Lifestyle Do not use hot tubs, steam rooms, or saunas. Do not douche. Do not use tampons or scented sanitary pads. Avoid cat litter boxes and soil used by cats. These carry germs that can harm your baby and can cause a loss of your baby by miscarriage or stillbirth. Do not use herbal medicines, illegal drugs, or medicines that are not approved by your doctor. Do not drink alcohol. Do not smoke or use any products that contain nicotine or tobacco. If you  need help quitting, ask your doctor. General instructions Keep all follow-up visits. This is important. Ask your doctor about local prenatal classes. Ask your doctor about the right foods to eat or for help finding a counselor. Where to find more information American Pregnancy Association: americanpregnancy.org [employer]ians [employer]necologists: www.acog.org Office on [employer] Health: MightyReward.co.nz Contact a doctor if: You have a headache that does not go away when you take medicine. You have changes in how you see, or you see spots in front of your eyes. You have mild cramps, pressure, or pain in your lower belly. You continue to feel like you may vomit (nauseous), you vomit, or you have watery poop (diarrhea). You have bad-smelling fluid coming from your vagina. You have pain when you pee or your pee smells bad. You have very bad swelling of your face, hands, ankles, feet, or legs. You have a fever. Get help right away if: You are leaking fluid from your vagina. You have spotting or bleeding from your vagina. You have very bad belly cramping or pain. You have trouble breathing. You have chest pain. You faint. You have not felt your baby move for the time period told by your doctor. You have new or increased pain, swelling, or redness in an arm or leg. Summary The second trimester of pregnancy is from week 13 through week 27 (months 4 through 6). Eat healthy meals. Exercise as told by your doctor. Most people can do their usual exercise during pregnancy. Do not use herbal medicines, illegal drugs, or medicines that are not approved by your doctor. Do not drink alcohol. Call your doctor if you get sick or if you notice anything unusual about your pregnancy. This information is not intended to replace advice given to you by your health care provider. Make sure you discuss any questions you have with your health care provider. Document Revised: 07/26/2019  Document Reviewed: 06/01/2019 Elsevier Patient Education  2022 [employer].  For any pregnancy-related emergencies after 5:30 pm please go to the Maternity Admissions Unit in the [facility] at [facility]. You will use hospital Entrance C.

## 2020-11-20 NOTE — Progress Notes (Signed)
Jamie Gates is a 20 y.o. G1P0 at [redacted]w[redacted]d here for routine follow up. She is dated byLMP.  She reports nausea. See flow sheet for details.  A/P: Pregnancy at [redacted]w[redacted]d.  Doing well.   Dating reviewed, dating tab is correct Fetal heart tones Appropriate POCT u/s visualized heart and fetal movement.  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  Influenza vaccine not administered as patient declined, will continue to discuss.   The patient has the following indication for screening preexisting diabetes: BMI > 25 and high risk ethnicity (Latino, [place] American, Native American, [place] Islander, Asian Naval architect) . Anatomy ultrasound ordered to be scheduled at 18-20 weeks. Patient is interested in genetic screening. As she is past 13 weeks and 6 days, a Quad screen  was previously completed.  Pregnancy education including expected weight gain in pregnancy, OTC medication use, continued use of prenatal vitamin, smoking cessation if applicable, and nutrition in pregnancy.   Bleeding and pain precautions reviewed. 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 Continue ASA 81 mg daily Continue Prenatal vitamins Will need prophylactic HSV treatment at 36 weeks, do not discuss in front of partner

## 2020-11-21 DIAGNOSIS — Z3491 Encounter for supervision of normal pregnancy, unspecified, first trimester: Secondary | ICD-10-CM | POA: Insufficient documentation

## 2020-11-21 LAB — GLUCOSE TOLERANCE, 1 HOUR: Glucose, 1Hr PP: 66 mg/dL (ref 65–199)

## 2020-11-22 LAB — AB SCR+ANTIBODY ID: Antibody Screen: POSITIVE — AB

## 2020-11-22 LAB — ANTIBODY SCREEN

## 2020-11-25 ENCOUNTER — Other Ambulatory Visit: Payer: Self-pay | Admitting: Family Medicine

## 2020-11-25 DIAGNOSIS — R931 Abnormal findings on diagnostic imaging of heart and coronary circulation: Secondary | ICD-10-CM

## 2020-11-26 ENCOUNTER — Telehealth: Payer: Self-pay

## 2020-11-27 ENCOUNTER — Other Ambulatory Visit: Payer: Self-pay | Admitting: Family Medicine

## 2020-11-27 MED ORDER — PRENATAL VITAMIN PLUS LOW IRON 27-1 MG PO TABS: 1.0000 mg | ORAL_TABLET | Freq: Every day | ORAL | 2 refills | Status: DC

## 2020-12-02 ENCOUNTER — Other Ambulatory Visit: Payer: Self-pay

## 2020-12-02 ENCOUNTER — Inpatient Hospital Stay (HOSPITAL_COMMUNITY)
Admission: AD | Admit: 2020-12-02 | Discharge: 2020-12-02 | Disposition: A | Discharge status: Home / Self Care | Payer: Medicaid Other | Attending: Obstetrics & Gynecology | Admitting: Obstetrics & Gynecology

## 2020-12-02 ENCOUNTER — Encounter (HOSPITAL_COMMUNITY): Payer: Self-pay | Admitting: Obstetrics & Gynecology

## 2020-12-02 DIAGNOSIS — O99612 Diseases of the digestive system complicating pregnancy, second trimester: Secondary | ICD-10-CM | POA: Diagnosis not present

## 2020-12-02 DIAGNOSIS — Z3A18 18 weeks gestation of pregnancy: Secondary | ICD-10-CM

## 2020-12-02 DIAGNOSIS — O99332 Smoking (tobacco) complicating pregnancy, second trimester: Secondary | ICD-10-CM | POA: Insufficient documentation

## 2020-12-02 DIAGNOSIS — Z7982 Long term (current) use of aspirin: Secondary | ICD-10-CM | POA: Diagnosis not present

## 2020-12-02 DIAGNOSIS — O219 Vomiting of pregnancy, unspecified: Secondary | ICD-10-CM | POA: Diagnosis not present

## 2020-12-02 DIAGNOSIS — K219 Gastro-esophageal reflux disease without esophagitis: Secondary | ICD-10-CM | POA: Insufficient documentation

## 2020-12-02 DIAGNOSIS — F17210 Nicotine dependence, cigarettes, uncomplicated: Secondary | ICD-10-CM | POA: Diagnosis not present

## 2020-12-02 DIAGNOSIS — O210 Mild hyperemesis gravidarum: Secondary | ICD-10-CM | POA: Insufficient documentation

## 2020-12-02 LAB — URINALYSIS, ROUTINE W REFLEX MICROSCOPIC
Bilirubin Urine: NEGATIVE
Glucose, UA: NEGATIVE mg/dL
Hgb urine dipstick: NEGATIVE
Ketones, ur: NEGATIVE mg/dL
Leukocytes,Ua: NEGATIVE
Nitrite: NEGATIVE
Protein, ur: NEGATIVE mg/dL
Specific Gravity, Urine: 1.006 (ref 1.005–1.030)
pH: 6 (ref 5.0–8.0)

## 2020-12-02 MED ORDER — PROMETHAZINE HCL 12.5 MG PO TABS: 12.5000 mg | ORAL_TABLET | Freq: Four times a day (QID) | ORAL | 1 refills | Status: DC | PRN

## 2020-12-02 MED ORDER — PROMETHAZINE HCL 25 MG PO TABS
25.0000 mg | ORAL_TABLET | Freq: Once | ORAL | Status: AC
  Administered 2020-12-02: 25 mg via ORAL
  Filled 2020-12-02: qty 1

## 2020-12-02 MED ORDER — FAMOTIDINE 20 MG PO TABS: 20.0000 mg | ORAL_TABLET | Freq: Every day | ORAL | 1 refills | Status: DC

## 2020-12-02 MED ORDER — FAMOTIDINE 20 MG PO TABS
20.0000 mg | ORAL_TABLET | Freq: Once | ORAL | Status: AC
  Administered 2020-12-02: 20 mg via ORAL
  Filled 2020-12-02: qty 1

## 2020-12-02 NOTE — MAU Provider Note (Signed)
History   [id number]  Arrival date and time: 12/02/20 1815   Chief Complaint  Patient presents with   Emesis   HPI Jamie Gates is a 20 y.o. at [redacted]w[redacted]d who presents to MAU due to vomiting and throat soreness. Patient reports that she started having problems with vomiting since entering her 2nd trimester of pregnancy. It does not occur every day - some days she has no issues. She thinks she is symptomatic about every other day at this point. She also reports a hx of GERD and has noted more reflux with this as well. Still able to eat and drink well. Symptoms are worse if she goes a long time without eating. She does not have any anti-nausea medications and therefore came to MAU for further evaluation and treatment. She denies any abdominal pain, dysuria, or vaginal discharge. No fever or other respiratory symptoms.  Vaginal bleeding: No LOF: No Fetal Movement: Not yet, possibly some flutters  Contractions: No  B/Negative/-- (07/20 1007)  OB History     Gravida  1   Para      Term      Preterm      AB      Living         SAB      IAB      Ectopic      Multiple      Live Births              Past Medical History:  Diagnosis Date   Allergy    Cardiomegaly 12/18/2012   still working with cardiology-esp now with pregnancy   Obesity    Ovarian cyst     Past Surgical History:  Procedure Laterality Date   TYMPANOSTOMY TUBE PLACEMENT      Family History  Problem Relation Age of Onset   Diabetes Father    Hyperlipidemia Father    Hypertension Father     Social History   Socioeconomic History   Marital status: Single    Spouse name: Not on file   Number of children: Not on file   Years of education: Not on file   Highest education level: Not on file  Occupational History   Not on file  Tobacco Use   Smoking status: Every Day    Packs/day: 0.00    Types: Cigars, Cigarettes   Smokeless tobacco: Never  Vaping Use   Vaping Use: Never used   Substance and Sexual Activity   Alcohol use: No   Drug use: No   Sexual activity: Yes    Birth control/protection: Condom  Other Topics Concern   Not on file  Social History Narrative   Patient is living with her dad at this time in school still getting passing grades   Social Determinants of Health   Financial Resource Strain: Not on file  Food Insecurity: Not on file  Transportation Needs: Not on file  Physical Activity: Not on file  Stress: Not on file  Social Connections: Not on file  Intimate Partner Violence: Not on file    No Known Allergies  No current facility-administered medications on file prior to encounter.   Current Outpatient Medications on File Prior to Encounter  Medication Sig Dispense Refill   aspirin EC 81 MG tablet Take 81 mg by mouth daily. Swallow whole.     Prenatal Vit-Fe Fumarate-FA (PRENATAL VITAMIN PLUS LOW IRON) 27-1 MG TABS Take 1 mg by mouth daily. 90 tablet 2    ROS Pertinent positives  and negative per HPI, all others reviewed and negative  Physical Exam   BP 113/71 (BP Location: Left Arm)   Pulse 92   Temp 98.1 F (36.7 C)   Resp 18   Ht 6' (1.829 m)   Wt 127.5 kg   LMP 07/29/2020 (Exact Date)   BMI 38.11 kg/m   Physical Exam Constitutional:      General: She is not in acute distress.    Appearance: Normal appearance.  HENT:     Head: Normocephalic and atraumatic.     Nose: Nose normal. No congestion.     Mouth/Throat:     Mouth: Mucous membranes are moist.     Pharynx: Oropharynx is clear. No oropharyngeal exudate or posterior oropharyngeal erythema.  Eyes:     Extraocular Movements: Extraocular movements intact.     Conjunctiva/sclera: Conjunctivae normal.  Cardiovascular:     Rate and Rhythm: Normal rate.  Pulmonary:     Effort: Pulmonary effort is normal.  Abdominal:     General: There is no distension.     Palpations: Abdomen is soft.     Tenderness: There is no abdominal tenderness. There is no guarding or  rebound.  Musculoskeletal:        General: Normal range of motion.     Right lower leg: No edema.     Left lower leg: No edema.  Skin:    General: Skin is warm and dry.  Neurological:     General: No focal deficit present.     Mental Status: She is alert and oriented to person, place, and time.  Psychiatric:        Mood and Affect: Mood normal.        Behavior: Behavior normal.   FHT 146 bpm  Labs Results for orders placed or performed during the hospital encounter of 12/02/20 (from the past 24 hour(s))  Urinalysis, Routine w reflex microscopic Urine, Clean Catch     Status: None   Collection Time: 12/02/20  6:56 PM  Result Value Ref Range   Color, Urine YELLOW YELLOW   APPearance CLEAR CLEAR   Specific Gravity, Urine 1.006 1.005 - 1.030   pH 6.0 5.0 - 8.0   Glucose, UA NEGATIVE NEGATIVE mg/dL   Hgb urine dipstick NEGATIVE NEGATIVE   Bilirubin Urine NEGATIVE NEGATIVE   Ketones, ur NEGATIVE NEGATIVE mg/dL   Protein, ur NEGATIVE NEGATIVE mg/dL   Nitrite NEGATIVE NEGATIVE   Leukocytes,Ua NEGATIVE NEGATIVE    Imaging No results found.  MAU Course  Procedures Lab Orders         Urinalysis, Routine w reflex microscopic Urine, Clean Catch     Meds ordered this encounter  Medications   promethazine (PHENERGAN) tablet 25 mg   famotidine (PEPCID) tablet 20 mg   DISCONTD: famotidine (PEPCID) 20 MG tablet    Sig: Take 1 tablet (20 mg total) by mouth daily.    Dispense:  30 tablet    Refill:  1   DISCONTD: promethazine (PHENERGAN) 12.5 MG tablet    Sig: Take 1 tablet (12.5 mg total) by mouth every 6 (six) hours as needed for nausea or vomiting.    Dispense:  30 tablet    Refill:  1   famotidine (PEPCID) 20 MG tablet    Sig: Take 1 tablet (20 mg total) by mouth daily.    Dispense:  30 tablet    Refill:  1   promethazine (PHENERGAN) 12.5 MG tablet    Sig: Take 1 tablet (12.5  mg total) by mouth every 6 (six) hours as needed for nausea or vomiting.    Dispense:  30 tablet     Refill:  1   Imaging Orders  No imaging studies ordered today    MDM: Nausea/vomiting in the 2nd trimester  Complicated by GERD Increased throat soreness due to symptoms  Treated with Pepcid and Phenergan in the MAU with improvement Did have one episode of emesis; improved with ingestion of crackers  UA normal; no other concerning signs/symptoms such as abdominal pain, fever, GU symptoms   Assessment and Plan   1. Nausea/vomiting in pregnancy - Stable for discharge with further outpatient follow up - Prescribed Pepcid daily and Phenergan PRN for her symptoms  - Counseled on eating meals regularly to help prevent symptoms - Reviewed foods that commonly trigger GERD symptoms - Return precautions reviewed should symptoms worsen or fail to improve   Evalina Field, MD OB Fellow, Faculty Practice [facility] 12/03/2020 8:26 AM

## 2020-12-02 NOTE — MAU Note (Signed)
Pt reports she has had N/V x 1 week. Has gotten worse today. Stated her throat is burning from vomiting so much.  Has no  nausea medications to take.

## 2020-12-09 ENCOUNTER — Ambulatory Visit: Payer: Medicaid Other

## 2020-12-09 ENCOUNTER — Other Ambulatory Visit: Payer: Medicaid Other

## 2020-12-17 ENCOUNTER — Ambulatory Visit: Payer: Medicaid Other | Admitting: *Deleted

## 2020-12-17 ENCOUNTER — Encounter: Payer: Self-pay | Admitting: *Deleted

## 2020-12-17 ENCOUNTER — Other Ambulatory Visit: Payer: Self-pay | Admitting: Obstetrics

## 2020-12-17 ENCOUNTER — Other Ambulatory Visit: Payer: Self-pay | Admitting: *Deleted

## 2020-12-17 ENCOUNTER — Other Ambulatory Visit: Payer: Self-pay

## 2020-12-17 ENCOUNTER — Ambulatory Visit (HOSPITAL_BASED_OUTPATIENT_CLINIC_OR_DEPARTMENT_OTHER): Payer: Medicaid Other | Admitting: Obstetrics and Gynecology

## 2020-12-17 ENCOUNTER — Ambulatory Visit: Payer: Medicaid Other | Attending: Obstetrics

## 2020-12-17 VITALS — BP 102/53 | HR 65

## 2020-12-17 DIAGNOSIS — N83209 Unspecified ovarian cyst, unspecified side: Secondary | ICD-10-CM | POA: Insufficient documentation

## 2020-12-17 DIAGNOSIS — N83201 Unspecified ovarian cyst, right side: Secondary | ICD-10-CM | POA: Diagnosis not present

## 2020-12-17 DIAGNOSIS — O99412 Diseases of the circulatory system complicating pregnancy, second trimester: Secondary | ICD-10-CM | POA: Insufficient documentation

## 2020-12-17 DIAGNOSIS — Z362 Encounter for other antenatal screening follow-up: Secondary | ICD-10-CM

## 2020-12-17 DIAGNOSIS — O3482 Maternal care for other abnormalities of pelvic organs, second trimester: Secondary | ICD-10-CM

## 2020-12-17 DIAGNOSIS — O99331 Smoking (tobacco) complicating pregnancy, first trimester: Secondary | ICD-10-CM | POA: Diagnosis not present

## 2020-12-17 DIAGNOSIS — Z3A20 20 weeks gestation of pregnancy: Secondary | ICD-10-CM | POA: Insufficient documentation

## 2020-12-17 DIAGNOSIS — O99212 Obesity complicating pregnancy, second trimester: Secondary | ICD-10-CM | POA: Insufficient documentation

## 2020-12-17 DIAGNOSIS — O99332 Smoking (tobacco) complicating pregnancy, second trimester: Secondary | ICD-10-CM | POA: Diagnosis not present

## 2020-12-17 DIAGNOSIS — Z6836 Body mass index (BMI) 36.0-36.9, adult: Secondary | ICD-10-CM

## 2020-12-18 NOTE — Progress Notes (Signed)
Maternal-Fetal Medicine   Name: Jamie Gates DOB: 07-09-2000 MRN: [id number] Referring Provider: Pearlean Brownie, MD  I had the pleasure of seeing Ms. Pe [facility] at [facility]. She is G1 P0 at 20w 1d gestation and is here for fetal anatomy scan and consultation.  Past medical history is significant for an abnormal echocardiography finding of decreased left ventricular function. Patient had echocardiography on 11/20/20 that showed a low left ventricular ejection fraction was 40% to 45%. The left ventricle was moderately dilated and demonstrated global hypokinesis. LV diastolic parameters were normal. Rest of the cardiac anatomy appeared normal.  Patient had echocardiography because of her symptoms of shortness of breath and "unable to stand for 10 minutes." She does not have chest pain or palpitations.  ROS: No headache, occasional spotting before eyes, no abdominal or joint pains. No recurrent urinary infections or vaginal bleeding. No nausea or vomiting or diarrhea.  PMH: No history of hypertension or diabetes or any other chronic medical conditions. She does not have sickle-cell trait. No history of COVID-19 infection. PSH: Tympanostomy tube placement. Medications: Prenatal vitamins, aspirin 81 mg daily. Allergies: NKDA. Social: Denies tobacco or drug or alcohol use. Her partner is [place] American, and he is in good health. Family: No history of venous thromboembolism. Father has diabetes and pacemaker.  Prenatal course: Early screening ruled out gestational diabetes. On cell-free fetal DNA screening, the risks of fetal aneuploidies are not increased. Routine screening showed anti-Lewis antibodies (not associated with fetal hemolysis). Blood pressure: 102/53, pulse 65/min. Patient does not have shortness of breath.  Ultrasound We performed fetal anatomical survey. Amniotic fluid is normal and good fetal activity is seen. No markers of fetal aneuploidies or  structural defects are seen. Fetal biometry is consistent with her previously-established dates.   A large right ovarian simple cyst measuring 10.1 x 7.7 x x7.4 cm is seen.  Because of the appearance of cervical shortening on transabdominal scan, we performed transvaginal ultrasound. The cervix measures 3.8 cm, which is normal. Posterior fossa and cerebellum appear normal.  Our concerns include: Left ventricular dilation and dysfunction About 1% to 4% of pregnant women have some preexisting cardiac disease and some are unaware of them. Our patient will be seeing a cardiologist next month. No clear history or past evaluation exists to determine the left ventricular dysfunction. She does not have a history of cardiomyopathy or severe viral infections. This is her first pregnancy.  Risk stratification can only be based on low LVEF and lesion-specific assessment cannot be made. Based on Modified World Health Organization [facility]) Classification of Cardiovascular risk, LVEF between 30% to 45% belongs to Class III where women have a 19% to 27% of having a major cardiac event.   Cardiac events include heart failure, stroke, arrhythmias, and death.  I counseled the patient on the above and encouraged her to meet with a cardiologist. We will counsel her again based on cardiologist's recommendations. I discussed the physiological changes in pregnancy and labor. We will be seeing her in this pregnancy and the patient is likely to have serial fetal growth assessments.   I reassured her that vaginal delivery can be attempted in the absence of severe heart failure.  Right Ovarian cyst Patient does not have mild or severe abdominal pain. I explained the finding of ovarian cyst (patient is already aware of the finding). Ultrasound performed in March 2019 showed a right ovarian cyst measuring 7 x 8 x 9.5 cm. Ovarian cysts can undergo torsion especially in  the postpartum period and can cause severe abdominal pain. If  the patient undergoes cesarean delivery for obstetric indication, cystectomy should be performed. About 5% of the cysts are malignant.  Recommendations -An appointment was made for her to return in 4 weeks for completion of fetal anatomy scan. -Cardiology consultation on 01/10/21.  Thank you for consultation.  If you have any questions or concerns, please contact me the [facility].  Consultation including face-to-face (more than 50%) counseling 45 minutes.

## 2020-12-19 ENCOUNTER — Other Ambulatory Visit: Payer: Self-pay | Admitting: Family Medicine

## 2020-12-19 DIAGNOSIS — I429 Cardiomyopathy, unspecified: Secondary | ICD-10-CM

## 2020-12-19 MED ORDER — METOPROLOL SUCCINATE ER 25 MG PO TB24: 12.5000 mg | ORAL_TABLET | Freq: Every day | ORAL | 3 refills | Status: DC

## 2020-12-19 NOTE — Progress Notes (Signed)
Metoprolol rx

## 2020-12-19 NOTE — Telephone Encounter (Signed)
RX addressed by PCP.  .Jamie Gates R Bucky Grigg, CMA  

## 2021-01-07 ENCOUNTER — Ambulatory Visit (INDEPENDENT_AMBULATORY_CARE_PROVIDER_SITE_OTHER): Payer: Medicaid Other | Admitting: Family Medicine

## 2021-01-07 ENCOUNTER — Encounter: Payer: Self-pay | Admitting: Family Medicine

## 2021-01-07 ENCOUNTER — Other Ambulatory Visit: Payer: Self-pay

## 2021-01-07 VITALS — BP 106/68 | HR 70 | Wt 286.4 lb

## 2021-01-07 DIAGNOSIS — O26899 Other specified pregnancy related conditions, unspecified trimester: Secondary | ICD-10-CM | POA: Diagnosis not present

## 2021-01-07 DIAGNOSIS — O0990 Supervision of high risk pregnancy, unspecified, unspecified trimester: Secondary | ICD-10-CM | POA: Insufficient documentation

## 2021-01-07 DIAGNOSIS — B009 Herpesviral infection, unspecified: Secondary | ICD-10-CM

## 2021-01-07 DIAGNOSIS — Z23 Encounter for immunization: Secondary | ICD-10-CM | POA: Diagnosis not present

## 2021-01-07 DIAGNOSIS — O99212 Obesity complicating pregnancy, second trimester: Secondary | ICD-10-CM

## 2021-01-07 DIAGNOSIS — R760 Raised antibody titer: Secondary | ICD-10-CM

## 2021-01-07 DIAGNOSIS — R8271 Bacteriuria: Secondary | ICD-10-CM

## 2021-01-07 DIAGNOSIS — Z6791 Unspecified blood type, Rh negative: Secondary | ICD-10-CM

## 2021-01-07 DIAGNOSIS — I517 Cardiomegaly: Secondary | ICD-10-CM | POA: Diagnosis not present

## 2021-01-07 DIAGNOSIS — O99891 Other specified diseases and conditions complicating pregnancy: Secondary | ICD-10-CM | POA: Diagnosis not present

## 2021-01-07 NOTE — Progress Notes (Signed)
Subjective:   Jamie Gates is a 20 y.o. G1P0000 at [redacted]w[redacted]d by LMP, midtrimester ultrasound being seen today for her first obstetrical visit.  Her obstetrical history is significant for obesity and cardiomyopathy . Patient does intend to breast feed. Pregnancy history fully reviewed.  Patient reports no complaints.  Transfer from [facility] for high risk pregnancy.   HISTORY: OB History  Gravida Para Term Preterm AB Living  1 0 0 0 0 0  SAB IAB Ectopic Multiple Live Births  0 0 0 0 0    # Outcome Date GA Lbr Len/2nd Weight Sex Delivery Anes PTL Lv  1 Current              Last pap smear: Lab Results  Component Value Date   DIAGPAP Molecular only (A) 12/07/2018    Past Medical History:  Diagnosis Date   Allergy    Cardiomegaly 12/18/2012   still working with cardiology-esp now with pregnancy   Obesity    Ovarian cyst    Past Surgical History:  Procedure Laterality Date   TYMPANOSTOMY TUBE PLACEMENT     Family History  Problem Relation Age of Onset   Diabetes Father    Hyperlipidemia Father    Hypertension Father    Social History   Tobacco Use   Smoking status: Every Day    Packs/day: 0.00    Types: Cigars, Cigarettes   Smokeless tobacco: Never  Vaping Use   Vaping Use: Never used  Substance Use Topics   Alcohol use: No   Drug use: No   No Known Allergies Current Outpatient Medications on File Prior to Visit  Medication Sig Dispense Refill   aspirin EC 81 MG tablet Take 81 mg by mouth daily. Swallow whole.     famotidine (PEPCID) 20 MG tablet Take 1 tablet (20 mg total) by mouth daily. 30 tablet 1   Prenatal Vit-Fe Fumarate-FA (PRENATAL VITAMIN PLUS LOW IRON) 27-1 MG TABS Take 1 mg by mouth daily. 90 tablet 2   metoprolol succinate (TOPROL-XL) 25 MG 24 hr tablet Take 0.5 tablets (12.5 mg total) by mouth daily. (Patient not taking: Reported on 01/07/2021) 45 tablet 3   No current facility-administered medications on file prior to visit.     Exam    Vitals:   01/07/21 1017  BP: 106/68  Pulse: 70  Weight: 286 lb 6.4 oz (129.9 kg)   Fetal Heart Rate (bpm): 140  System: General: well-developed, well-nourished female in no acute distress   Skin: normal coloration and turgor, no rashes   Neurologic: oriented, normal, negative, normal mood   Extremities: normal strength, tone, and muscle mass, ROM of all joints is normal   HEENT PERRLA, extraocular movement intact and sclera clear, anicteric   Neck supple and no masses   Respiratory:  no respiratory distress      Assessment:   Pregnancy: G1P0000 Patient Active Problem List   Diagnosis Date Noted   Supervision of high risk pregnancy, antepartum 01/07/2021   First trimester pregnancy 11/21/2020   Adjustment disorder with depressed mood 09/30/2020   Abnormal antibody titer 09/30/2020   Supervision of normal pregnancy 09/30/2020   GBS bacteriuria 09/23/2020   Positive pregnancy test 09/20/2020   Vaginal irritation 02/13/2020   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 Supervision of high risk pregnancy, antepartum BP and FHR normal Initial labs reviewed Continue prenatal vitamins. Genetic Screening discussed, NIPS: normal Materni 21. Ultrasound discussed; fetal anatomic survey: normal, following w MFM. Problem list reviewed and updated. The nature of [facility] with multiple MDs and other Advanced Practice Providers was explained to patient; also emphasized that residents, students are part of our team.   2. GBS bacteriuria Reports taking abx after new OB TOC today - Culture, OB Urine  3. Abnormal antibody  titer Lewis antibodies, not clinically significant  4. Cardiomegaly TTE on 11/20/20 shows mildly reduced EF of 40-45%, global hypokinesis, normal biatrial sizes and PA pressures Unclear etiology Has appt w Cardio-OB on 01/10/2021 Previously sent rx for metoprolol to patient, not yet started, encouraged her to do so Already started on prenatal ASA  5. Obesity affecting pregnancy in second trimester   6. HSV infection Does not want discussed in front of partner, unable to explore further today   Routine obstetric precautions reviewed. Return in 4 weeks (on 02/04/2021) for [facility], ob visit, needs MD.

## 2021-01-07 NOTE — Patient Instructions (Signed)
Second Trimester of Pregnancy °The second trimester of pregnancy is from week 13 through week 27. This is months 4 through 6 of pregnancy. The second trimester is often a time when you feel your best. Your body has adjusted to being pregnant, and you begin to feel better physically. °During the second trimester: °Morning sickness has lessened or stopped completely. °You may have more energy. °You may have an increase in appetite. °The second trimester is also a time when the unborn baby (fetus) is growing rapidly. At the end of the sixth month, the fetus may be up to 12 inches long and weigh about 1½ pounds. You will likely begin to feel the baby move (quickening) between 16 and 20 weeks of pregnancy. °Body changes during your second trimester °Your body continues to go through many changes during your second trimester. The changes vary and generally return to normal after the baby is born. °Physical changes °Your weight will continue to increase. You will notice your lower abdomen bulging out. °You may begin to get stretch marks on your hips, abdomen, and breasts. °Your breasts will continue to grow and to become tender. °Dark spots or blotches (chloasma or mask of pregnancy) may develop on your face. °A dark line from your belly button to the pubic area (linea nigra) may appear. °You may have changes in your hair. These can include thickening of your hair, rapid growth, and changes in texture. Some people also have hair loss during or after pregnancy, or hair that feels dry or thin. °Health changes °You may develop headaches. °You may have heartburn. °You may develop constipation. °You may develop hemorrhoids or swollen, bulging veins (varicose veins). °Your gums may bleed and may be sensitive to brushing and flossing. °You may urinate more often because the fetus is pressing on your bladder. °You may have back pain. This is caused by: °Weight gain. °Pregnancy hormones that are relaxing the joints in your  pelvis. °A shift in weight and the muscles that support your balance. °Follow these instructions at home: °Medicines °Follow your health care provider's instructions regarding medicine use. Specific medicines may be either safe or unsafe to take during pregnancy. Do not take any medicines unless approved by your health care provider. °Take a prenatal vitamin that contains at least 600 micrograms (mcg) of folic acid. °Eating and drinking °Eat a healthy diet that includes fresh fruits and vegetables, whole grains, good sources of protein such as meat, eggs, or tofu, and low-fat dairy products. °Avoid raw meat and unpasteurized juice, milk, and cheese. These carry germs that can harm you and your baby. °You may need to take these actions to prevent or treat constipation: °Drink enough fluid to keep your urine pale yellow. °Eat foods that are high in fiber, such as beans, whole grains, and fresh fruits and vegetables. °Limit foods that are high in fat and processed sugars, such as fried or sweet foods. °Activity °Exercise only as directed by your health care provider. Most people can continue their usual exercise routine during pregnancy. Try to exercise for 30 minutes at least 5 days a week. Stop exercising if you develop contractions in your uterus. °Stop exercising if you develop pain or cramping in the lower abdomen or lower back. °Avoid exercising if it is very hot or humid or if you are at a high altitude. °Avoid heavy lifting. °If you choose to, you may have sex unless your health care provider tells you not to. °Relieving pain and discomfort °Wear a supportive bra   to prevent discomfort from breast tenderness. °Take warm sitz baths to soothe any pain or discomfort caused by hemorrhoids. Use hemorrhoid cream if your health care provider approves. °Rest with your legs raised (elevated) if you have leg cramps or low back pain. °If you develop varicose veins: °Wear support hose as told by your health care  provider. °Elevate your feet for 15 minutes, 3-4 times a day. °Limit salt in your diet. °Safety °Wear your seat belt at all times when driving or riding in a car. °Talk with your health care provider if someone is verbally or physically abusive to you. °Lifestyle °Do not use hot tubs, steam rooms, or saunas. °Do not douche. Do not use tampons or scented sanitary pads. °Avoid cat litter boxes and soil used by cats. These carry germs that can cause birth defects in the baby and possibly loss of the fetus by miscarriage or stillbirth. °Do not use herbal remedies, alcohol, illegal drugs, or medicines that are not approved by your health care provider. Chemicals in these products can harm your baby. °Do not use any products that contain nicotine or tobacco, such as cigarettes, e-cigarettes, and chewing tobacco. If you need help quitting, ask your health care provider. °General instructions °During a routine prenatal visit, your health care provider will do a physical exam and other tests. He or she will also discuss your overall health. Keep all follow-up visits. This is important. °Ask your health care provider for a referral to a local prenatal education class. °Ask for help if you have counseling or nutritional needs during pregnancy. Your health care provider can offer advice or refer you to specialists for help with various needs. °Where to find more information °American Pregnancy Association: americanpregnancy.org °American College of Obstetricians [employer] Gynecologists: acog.org/en/Womens%20Health/Pregnancy °Office on Women's Health: womenshealth.gov/pregnancy °Contact a health care provider if you have: °A headache that does not go away when you take medicine. °Vision changes or you see spots in front of your eyes. °Mild pelvic cramps, pelvic pressure, or nagging pain in the abdominal area. °Persistent nausea, vomiting, or diarrhea. °A bad-smelling vaginal discharge or foul-smelling urine. °Pain when you  urinate. °Sudden or extreme swelling of your face, hands, ankles, feet, or legs. °A fever. °Get help right away if you: °Have fluid leaking from your vagina. °Have spotting or bleeding from your vagina. °Have severe abdominal cramping or pain. °Have difficulty breathing. °Have chest pain. °Have fainting spells. °Have not felt your baby move for the time period told by your health care provider. °Have new or increased pain, swelling, or redness in an arm or leg. °Summary °The second trimester of pregnancy is from week 13 through week 27 (months 4 through 6). °Do not use herbal remedies, alcohol, illegal drugs, or medicines that are not approved by your health care provider. Chemicals in these products can harm your baby. °Exercise only as directed by your health care provider. Most people can continue their usual exercise routine during pregnancy. °Keep all follow-up visits. This is important. °This information is not intended to replace advice given to you by your health care provider. Make sure you discuss any questions you have with your health care provider. °Document Revised: 07/26/2019 Document Reviewed: 06/01/2019 °Elsevier Patient Education © 2022 [employer] ° °Contraception Choices °Contraception, also called birth control, refers to methods or devices that prevent pregnancy. °Hormonal methods °Contraceptive implant °A contraceptive implant is a thin, plastic tube that contains a hormone that prevents pregnancy. It is different from an intrauterine device (IUD). It   is inserted into the upper part of the arm by a health care provider. Implants can be effective for up to 3 years. °Progestin-only injections °Progestin-only injections are injections of progestin, a synthetic form of the hormone progesterone. They are given every 3 months by a health care provider. °Birth control pills °Birth control pills are pills that contain hormones that prevent pregnancy. They must be taken once a day, preferably at the  same time each day. A prescription is needed to use this method of contraception. °Birth control patch °The birth control patch contains hormones that prevent pregnancy. It is placed on the skin and must be changed once a week for three weeks and removed on the fourth week. A prescription is needed to use this method of contraception. °Vaginal ring °A vaginal ring contains hormones that prevent pregnancy. It is placed in the vagina for three weeks and removed on the fourth week. After that, the process is repeated with a new ring. A prescription is needed to use this method of contraception. °Emergency contraceptive °Emergency contraceptives prevent pregnancy after unprotected sex. They come in pill form and can be taken up to 5 days after sex. They work best the sooner they are taken after having sex. Most emergency contraceptives are available without a prescription. This method should not be used as your only form of birth control. °Barrier methods °Female condom °A female condom is a thin sheath that is worn over the penis during sex. Condoms keep sperm from going inside a woman's body. They can be used with a sperm-killing substance (spermicide) to increase their effectiveness. They should be thrown away after one use. °Female condom °A female condom is a soft, loose-fitting sheath that is put into the vagina before sex. The condom keeps sperm from going inside a woman's body. They should be thrown away after one use. °Diaphragm °A diaphragm is a soft, dome-shaped barrier. It is inserted into the vagina before sex, along with a spermicide. The diaphragm blocks sperm from entering the uterus, and the spermicide kills sperm. A diaphragm should be left in the vagina for 6-8 hours after sex and removed within 24 hours. °A diaphragm is prescribed and fitted by a health care provider. A diaphragm should be replaced every 1-2 years, after giving birth, after gaining more than 15 lb (6.8 kg), and after pelvic  surgery. °Cervical cap °A cervical cap is a round, soft latex or plastic cup that fits over the cervix. It is inserted into the vagina before sex, along with spermicide. It blocks sperm from entering the uterus. The cap should be left in place for 6-8 hours after sex and removed within 48 hours. A cervical cap must be prescribed and fitted by a health care provider. It should be replaced every 2 years. °Sponge °A sponge is a soft, circular piece of polyurethane foam with spermicide in it. The sponge helps block sperm from entering the uterus, and the spermicide kills sperm. To use it, you make it wet and then insert it into the vagina. It should be inserted before sex, left in for at least 6 hours after sex, and removed and thrown away within 30 hours. °Spermicides °Spermicides are chemicals that kill or block sperm from entering the cervix and uterus. They can come as a cream, jelly, suppository, foam, or tablet. A spermicide should be inserted into the vagina with an applicator at least 10-15 minutes before sex to allow time for it to work. The process must be repeated every time   you have sex. Spermicides do not require a prescription. °Intrauterine contraception °Intrauterine device (IUD) °An IUD is a T-shaped device that is put in a woman's uterus. There are two types: °Hormone IUD.This type contains progestin, a synthetic form of the hormone progesterone. This type can stay in place for 3-5 years. °Copper IUD.This type is wrapped in copper wire. It can stay in place for 10 years. °Permanent methods of contraception °Female tubal ligation °In this method, a woman's fallopian tubes are sealed, tied, or blocked during surgery to prevent eggs from traveling to the uterus. °Hysteroscopic sterilization °In this method, a small, flexible insert is placed into each fallopian tube. The inserts cause scar tissue to form in the fallopian tubes and block them, so sperm cannot reach an egg. The procedure takes about 3  months to be effective. Another form of birth control must be used during those 3 months. °Female sterilization °This is a procedure to tie off the tubes that carry sperm (vasectomy). After the procedure, the man can still ejaculate fluid (semen). Another form of birth control must be used for 3 months after the procedure. °Natural planning methods °Natural family planning °In this method, a couple does not have sex on days when the woman could become pregnant. °Calendar method °In this method, the woman keeps track of the length of each menstrual cycle, identifies the days when pregnancy can happen, and does not have sex on those days. °Ovulation method °In this method, a couple avoids sex during ovulation. °Symptothermal method °This method involves not having sex during ovulation. The woman typically checks for ovulation by watching changes in her temperature and in the consistency of cervical mucus. °Post-ovulation method °In this method, a couple waits to have sex until after ovulation. °Where to find more information °Centers for Disease Control and Prevention: www.cdc.gov °Summary °Contraception, also called birth control, refers to methods or devices that prevent pregnancy. °Hormonal methods of contraception include implants, injections, pills, patches, vaginal rings, and emergency contraceptives. °Barrier methods of contraception can include female condoms, female condoms, diaphragms, cervical caps, sponges, and spermicides. °There are two types of IUDs (intrauterine devices). An IUD can be put in a woman's uterus to prevent pregnancy for 3-5 years. °Permanent sterilization can be done through a procedure for males and females. Natural family planning methods involve nothaving sex on days when the woman could become pregnant. °This information is not intended to replace advice given to you by your health care provider. Make sure you discuss any questions you have with your health care provider. °Document  Revised: 07/24/2019 Document Reviewed: 07/24/2019 °Elsevier Patient Education © 2022 [employer] ° °

## 2021-01-09 DIAGNOSIS — O99891 Other specified diseases and conditions complicating pregnancy: Secondary | ICD-10-CM | POA: Insufficient documentation

## 2021-01-09 DIAGNOSIS — R8271 Bacteriuria: Secondary | ICD-10-CM | POA: Insufficient documentation

## 2021-01-09 LAB — CULTURE, OB URINE

## 2021-01-09 LAB — URINE CULTURE, OB REFLEX

## 2021-01-09 MED ORDER — AMOXICILLIN-POT CLAVULANATE 875-125 MG PO TABS: 1.0000 | ORAL_TABLET | Freq: Two times a day (BID) | ORAL | 0 refills | Status: AC

## 2021-01-09 NOTE — Addendum Note (Signed)
Addended by: Merian Capron on: 01/09/2021 07:52 PM   Modules accepted: Orders

## 2021-01-10 ENCOUNTER — Encounter: Payer: Self-pay | Admitting: Cardiology

## 2021-01-10 ENCOUNTER — Telehealth: Payer: Self-pay

## 2021-01-10 ENCOUNTER — Ambulatory Visit (INDEPENDENT_AMBULATORY_CARE_PROVIDER_SITE_OTHER): Payer: Medicaid Other | Admitting: Cardiology

## 2021-01-10 ENCOUNTER — Other Ambulatory Visit: Payer: Self-pay

## 2021-01-10 VITALS — BP 110/76 | HR 73 | Ht 72.0 in | Wt 287.0 lb

## 2021-01-10 DIAGNOSIS — O99210 Obesity complicating pregnancy, unspecified trimester: Secondary | ICD-10-CM | POA: Diagnosis not present

## 2021-01-10 DIAGNOSIS — R0989 Other specified symptoms and signs involving the circulatory and respiratory systems: Secondary | ICD-10-CM

## 2021-01-10 DIAGNOSIS — I420 Dilated cardiomyopathy: Secondary | ICD-10-CM | POA: Diagnosis not present

## 2021-01-10 NOTE — Progress Notes (Signed)
Cardio-Obstetrics Clinic  New Evaluation  Date:  01/10/2021   ID:  Katleen Carraway, DOB 02/08/2001, MRN [id number]  PCP:  Ronnald Ramp, MD   [facility] Providers Cardiologist:  Thomasene Ripple, DO  Electrophysiologist:  None       Referring MD: Latrelle Dodrill, MD   Chief Complaint: " I am doing fine but have intermittent shortness of breath"  History of Present Illness:    Jamie Gates is a 20 y.o. female [G1P0000] who is being seen today for the evaluation of depressed ejection fraction/Dilated cardiomyopathy at the request of Pollie Meyer Estevan Ryder, MD.   She has been experiencing shortness of breath and due to that an echo was ordered. Her echo did show evidence of depressed ejection fraction with 40 to 45%.  At this discussed with her OB/GYN primary and recommended to start off Toprol-XL 12.5 mg daily.  Fortunately patient has not started this medication.  She did tell me that it was sent to the pharmacy by her OB/GYN but she has not picked it up yet.  She has not had any shortness of breath that is worsened.  No other complaints at this time.  Prior CV Studies Reviewed: The following studies were reviewed today:  TTE 11/20/2020 IMPRESSIONS   1. Left ventricular ejection fraction, by estimation, is 40 to 45%. The left ventricle has mildly decreased function. The left ventricle  demonstrates global hypokinesis. The left ventricular internal cavity size was moderately dilated. Left ventricular  diastolic parameters were normal.   2. Right ventricular systolic function is normal. The right ventricular size is normal. There is normal pulmonary artery systolic pressure.   3. The mitral valve is normal in structure. Mild mitral valve regurgitation. No evidence of mitral stenosis.   4. The aortic valve is tricuspid. Aortic valve regurgitation is not visualized. No aortic stenosis is present.   5. The inferior vena cava is normal in size with greater than 50%   respiratory variability, suggesting right atrial pressure of 3 mmHg.   FINDINGS   Left Ventricle: Left ventricular ejection fraction, by estimation, is 40  to 45%. The left ventricle has mildly decreased function. The left  ventricle demonstrates global hypokinesis. The left ventricular internal  cavity size was moderately dilated.  There is no left ventricular hypertrophy. Left ventricular diastolic  parameters were normal. Indeterminate filling pressures.   Right Ventricle: The right ventricular size is normal. No increase in  right ventricular wall thickness. Right ventricular systolic function is  normal. There is normal pulmonary artery systolic pressure. The tricuspid  regurgitant velocity is 1.63 m/s, and   with an assumed right atrial pressure of 3 mmHg, the estimated right  ventricular systolic pressure is 13.6 mmHg.   Left Atrium: Left atrial size was normal in size.   Right Atrium: Right atrial size was normal in size.   Pericardium: There is no evidence of pericardial effusion.   Mitral Valve: The mitral valve is normal in structure. Mild mitral valve  regurgitation. No evidence of mitral valve stenosis.   Tricuspid Valve: The tricuspid valve is normal in structure. Tricuspid  valve regurgitation is trivial. No evidence of tricuspid stenosis.   Aortic Valve: The aortic valve is tricuspid. Aortic valve regurgitation is  not visualized. No aortic stenosis is present.   Pulmonic Valve: The pulmonic valve was normal in structure. Pulmonic valve  regurgitation is not visualized. No evidence of pulmonic stenosis.   Aorta: The aortic root is normal in size and structure.   Venous:  The inferior vena cava is normal in size with greater than 50%  respiratory variability, suggesting right atrial pressure of 3 mmHg.   IAS/Shunts: No atrial level shunt detected by color flow Doppler.      Past Medical History:  Diagnosis Date   Allergy    Cardiomegaly 12/18/2012    still working with cardiology-esp now with pregnancy   Obesity    Ovarian cyst     Past Surgical History:  Procedure Laterality Date   TYMPANOSTOMY TUBE PLACEMENT        OB History     Gravida  1   Para  0   Term  0   Preterm  0   AB  0   Living  0      SAB  0   IAB  0   Ectopic  0   Multiple  0   Live Births  0               Current Medications: Current Meds  Medication Sig   amoxicillin-clavulanate (AUGMENTIN) 875-125 MG tablet Take 1 tablet by mouth 2 (two) times daily for 5 days.   aspirin EC 81 MG tablet Take 81 mg by mouth daily. Swallow whole.   metoprolol succinate (TOPROL-XL) 25 MG 24 hr tablet Take 0.5 tablets (12.5 mg total) by mouth daily.   Prenatal Vit-Fe Fumarate-FA (PRENATAL VITAMIN PLUS LOW IRON) 27-1 MG TABS Take 1 mg by mouth daily.     Allergies:   Patient has no known allergies.   Social History   Socioeconomic History   Marital status: Single    Spouse name: Not on file   Number of children: Not on file   Years of education: Not on file   Highest education level: Not on file  Occupational History   Not on file  Tobacco Use   Smoking status: Former    Packs/day: 0.00    Types: Cigars, Cigarettes   Smokeless tobacco: Never  Vaping Use   Vaping Use: Never used  Substance and Sexual Activity   Alcohol use: No   Drug use: No   Sexual activity: Yes    Birth control/protection: Condom  Other Topics Concern   Not on file  Social History Narrative   Patient is living with her dad at this time in school still getting passing grades   Social Determinants of Health   Financial Resource Strain: Low Risk    Difficulty of Paying Living Expenses: Not hard at all  Food Insecurity: No Food Insecurity   Worried About Programme researcher, broadcasting/film/video in the Last Year: Never true   Ran Out of Food in the Last Year: Never true  Transportation Needs: Unknown   Lack of Transportation (Medical): No   Lack of Transportation (Non-Medical): Not on  file  Physical Activity: Not on file  Stress: Not on file  Social Connections: Not on file      Family History  Problem Relation Age of Onset   Raynaud syndrome Mother    Diabetes Father    Hyperlipidemia Father    Hypertension Father       ROS:   Please see the history of present illness.      Review of Systems  Constitution: Negative for decreased appetite, fever and weight gain.  HENT: Negative for congestion, ear discharge, hoarse voice and sore throat.   Eyes: Negative for discharge, redness, vision loss in right eye and visual halos.  Cardiovascular: Reports shortness of breath on  exertion.  Negative for chest pain,  leg swelling, orthopnea and palpitations.  Respiratory: Negative for cough, hemoptysis, shortness of breath and snoring.   Endocrine: Negative for heat intolerance and polyphagia.  Hematologic/Lymphatic: Negative for bleeding problem. Does not bruise/bleed easily.  Skin: Negative for flushing, nail changes, rash and suspicious lesions.  Musculoskeletal: Negative for arthritis, joint pain, muscle cramps, myalgias, neck pain and stiffness.  Gastrointestinal: Negative for abdominal pain, bowel incontinence, diarrhea and excessive appetite.  Genitourinary: Negative for decreased libido, genital sores and incomplete emptying.  Neurological: Negative for brief paralysis, focal weakness, headaches and loss of balance.  Psychiatric/Behavioral: Negative for altered mental status, depression and suicidal ideas.  Allergic/Immunologic: Negative for HIV exposure and persistent infections.     Labs/EKG Reviewed:    EKG:   EKG is was ordered today.  The ekg ordered today demonstrates sinus rhythm, heart rate 75 bpm  Recent Labs: 09/18/2020: Hemoglobin 11.7; Platelets 212   Recent Lipid Panel No results found for: CHOL, TRIG, HDL, CHOLHDL, LDLCALC, LDLDIRECT  Physical Exam:    VS:  BP 110/76 (BP Location: Right Arm)   Pulse 73   Ht 6' (1.829 m)   Wt 287 lb  (130.2 kg)   LMP 07/29/2020 (Exact Date)   SpO2 100%   BMI 38.92 kg/m     Wt Readings from Last 3 Encounters:  01/10/21 287 lb (130.2 kg)  01/07/21 286 lb 6.4 oz (129.9 kg)  12/02/20 281 lb (127.5 kg)     GEN:  Well nourished, well developed in no acute distress HEENT: Normal NECK: No JVD; No carotid bruits LYMPHATICS: No lymphadenopathy CARDIAC: RRR, no murmurs, rubs, gallops RESPIRATORY:  Clear to auscultation without rales, wheezing or rhonchi  ABDOMEN: Soft, non-tender, non-distended MUSCULOSKELETAL:  No edema; No deformity  SKIN: Warm and dry NEUROLOGIC:  Alert and oriented x 3 PSYCHIATRIC:  Normal affect    Risk Assessment/Risk Calculators:     CARPREG II Risk Prediction Index Score:  1.  The patient's risk for a primary cardiac event is 5%.   Modified World Health Organization [facility]) Classification of Maternal CV Risk   Class I         ASSESSMENT & PLAN:    Depressed ejection fraction Heart failure with reduced ejection fraction Obesity affecting pregnancy  She was found to have heart failure with reduced ejection fraction EF 40 to 45%.  Etiology unknown.  Given his young patient suspect that this etiology may be nonischemic but will limited to any evaluation to understand her cardiomyopathy.  Treatment is paramount here for this patient.  She has not started her beta-blocker.  I have asked the patient to please pick up her prescription for Toprol-XL 12.5 mg daily she is in agreement to do so.  I explained to the patient what this is important.  Thankfully she is euvolemic and there is no need for any diuretics at this time.  Today's visit included educating her about the diagnosis and my suspicion for nonischemic cardiomyopathy.  Hopefully postpartum we can consider treatment repeat echocardiogram as well as probably cardiac MRI.  I have educated patient about warning signs understand if she gets worsening shortness of breath to notify my office.  I encouraged  the patient to keep her weight gain less than 20 pounds during her pregnancy.  Follow-up in 12 weeks or sooner if needed.  Patient Instructions  Medication Instructions:  Your physician has recommended you make the following change in your medication:  START: Toprol-XL 12.5 mg once daily (  please pick up your prescription that was already sent in)  *If you need a refill on your cardiac medications before your next appointment, please call your pharmacy*   Lab Work: None If you have labs (blood work) drawn today and your tests are completely normal, you will receive your results only by: MyChart Message (if you have MyChart) OR A paper copy in the mail If you have any lab test that is abnormal or we need to change your treatment, we will call you to review the results.   Testing/Procedures: None   Follow-Up: At [facility], you and your health needs are our priority.  As part of our continuing mission to provide you with exceptional heart care, we have created designated Provider Care Teams.  These Care Teams include your primary Cardiologist (physician) and Advanced Practice Providers (APPs -  Physician Assistants and Nurse Practitioners) who all work together to provide you with the care you need, when you need it.  We recommend signing up for the patient portal called "MyChart".  Sign up information is provided on this After Visit Summary.  MyChart is used to connect with patients for Virtual Visits (Telemedicine).  Patients are able to view lab/test results, encounter notes, upcoming appointments, etc.  Non-urgent messages can be sent to your provider as well.   To learn more about what you can do with MyChart, go to [email or web address].    Your next appointment:   12 week(s)  The format for your next appointment:   In Person  Provider:   Thomasene Ripple  [facility] [address] [place], [place] [address]   Other Instructions     Dispo:  No follow-ups on  file.   Medication Adjustments/Labs and Tests Ordered: Current medicines are reviewed at length with the patient today.  Concerns regarding medicines are outlined above.  Tests Ordered: Orders Placed This Encounter  Procedures   EKG 12-Lead   Medication Changes: No orders of the defined types were placed in this encounter.

## 2021-01-10 NOTE — Patient Instructions (Signed)
Medication Instructions:  Your physician has recommended you make the following change in your medication:  START: Toprol-XL 12.5 mg once daily (please pick up your prescription that was already sent in)  *If you need a refill on your cardiac medications before your next appointment, please call your pharmacy*   Lab Work: None If you have labs (blood work) drawn today and your tests are completely normal, you will receive your results only by: MyChart Message (if you have MyChart) OR A paper copy in the mail If you have any lab test that is abnormal or we need to change your treatment, we will call you to review the results.   Testing/Procedures: None   Follow-Up: At [facility], you and your health needs are our priority.  As part of our continuing mission to provide you with exceptional heart care, we have created designated Provider Care Teams.  These Care Teams include your primary Cardiologist (physician) and Advanced Practice Providers (APPs -  Physician Assistants and Nurse Practitioners) who all work together to provide you with the care you need, when you need it.  We recommend signing up for the patient portal called "MyChart".  Sign up information is provided on this After Visit Summary.  MyChart is used to connect with patients for Virtual Visits (Telemedicine).  Patients are able to view lab/test results, encounter notes, upcoming appointments, etc.  Non-urgent messages can be sent to your provider as well.   To learn more about what you can do with MyChart, go to [email or web address].    Your next appointment:   12 week(s)  The format for your next appointment:   In Person  Provider:   Thomasene Ripple  [facility] [address] [place], [place] [address]   Other Instructions

## 2021-01-10 NOTE — Telephone Encounter (Signed)
Called pt in regards to her appointment today. No answer. Left a message for her to return the call.

## 2021-01-15 ENCOUNTER — Other Ambulatory Visit: Payer: Self-pay

## 2021-01-15 ENCOUNTER — Other Ambulatory Visit: Payer: Self-pay | Admitting: *Deleted

## 2021-01-15 ENCOUNTER — Ambulatory Visit: Payer: Medicaid Other | Admitting: *Deleted

## 2021-01-15 ENCOUNTER — Encounter: Payer: Self-pay | Admitting: *Deleted

## 2021-01-15 ENCOUNTER — Ambulatory Visit: Payer: Medicaid Other | Attending: Obstetrics and Gynecology

## 2021-01-15 VITALS — BP 112/56 | HR 69

## 2021-01-15 DIAGNOSIS — O99412 Diseases of the circulatory system complicating pregnancy, second trimester: Secondary | ICD-10-CM

## 2021-01-15 DIAGNOSIS — Z6836 Body mass index (BMI) 36.0-36.9, adult: Secondary | ICD-10-CM

## 2021-01-15 DIAGNOSIS — Z3A24 24 weeks gestation of pregnancy: Secondary | ICD-10-CM

## 2021-01-15 DIAGNOSIS — A6009 Herpesviral infection of other urogenital tract: Secondary | ICD-10-CM

## 2021-01-15 DIAGNOSIS — N83291 Other ovarian cyst, right side: Secondary | ICD-10-CM | POA: Diagnosis not present

## 2021-01-15 DIAGNOSIS — Z362 Encounter for other antenatal screening follow-up: Secondary | ICD-10-CM | POA: Insufficient documentation

## 2021-01-15 DIAGNOSIS — O3482 Maternal care for other abnormalities of pelvic organs, second trimester: Secondary | ICD-10-CM | POA: Insufficient documentation

## 2021-01-15 DIAGNOSIS — O0990 Supervision of high risk pregnancy, unspecified, unspecified trimester: Secondary | ICD-10-CM | POA: Insufficient documentation

## 2021-01-15 DIAGNOSIS — I519 Heart disease, unspecified: Secondary | ICD-10-CM

## 2021-01-15 DIAGNOSIS — O99212 Obesity complicating pregnancy, second trimester: Secondary | ICD-10-CM | POA: Insufficient documentation

## 2021-01-15 MED ORDER — VALACYCLOVIR HCL 500 MG PO TABS
500.0000 mg | ORAL_TABLET | Freq: Two times a day (BID) | ORAL | 4 refills | Status: DC
Start: 2021-01-15 — End: 2021-03-15

## 2021-01-15 MED ORDER — VALACYCLOVIR HCL 1 G PO TABS: 1000.0000 mg | ORAL_TABLET | Freq: Two times a day (BID) | ORAL | 0 refills | Status: DC

## 2021-02-05 ENCOUNTER — Other Ambulatory Visit: Payer: Self-pay

## 2021-02-05 ENCOUNTER — Ambulatory Visit (INDEPENDENT_AMBULATORY_CARE_PROVIDER_SITE_OTHER): Payer: Medicaid Other | Admitting: Obstetrics and Gynecology

## 2021-02-05 VITALS — BP 113/74 | HR 79 | Wt 296.5 lb

## 2021-02-05 DIAGNOSIS — I428 Other cardiomyopathies: Secondary | ICD-10-CM | POA: Insufficient documentation

## 2021-02-05 DIAGNOSIS — Z3A27 27 weeks gestation of pregnancy: Secondary | ICD-10-CM | POA: Diagnosis not present

## 2021-02-05 DIAGNOSIS — R8271 Bacteriuria: Secondary | ICD-10-CM

## 2021-02-05 DIAGNOSIS — O99212 Obesity complicating pregnancy, second trimester: Secondary | ICD-10-CM

## 2021-02-05 DIAGNOSIS — Z6791 Unspecified blood type, Rh negative: Secondary | ICD-10-CM | POA: Diagnosis not present

## 2021-02-05 DIAGNOSIS — O26899 Other specified pregnancy related conditions, unspecified trimester: Secondary | ICD-10-CM | POA: Diagnosis not present

## 2021-02-05 DIAGNOSIS — I429 Cardiomyopathy, unspecified: Secondary | ICD-10-CM

## 2021-02-05 DIAGNOSIS — Z23 Encounter for immunization: Secondary | ICD-10-CM

## 2021-02-05 DIAGNOSIS — O0990 Supervision of high risk pregnancy, unspecified, unspecified trimester: Secondary | ICD-10-CM

## 2021-02-05 HISTORY — DX: Other cardiomyopathies: I42.8

## 2021-02-05 MED ORDER — RHO D IMMUNE GLOBULIN 1500 UNIT/2ML IJ SOSY
300.0000 ug | PREFILLED_SYRINGE | Freq: Once | INTRAMUSCULAR | Status: AC
  Administered 2021-02-05: 300 ug via INTRAMUSCULAR

## 2021-02-05 NOTE — Progress Notes (Signed)
   PRENATAL VISIT NOTE  Subjective:  Jamie Gates is a 20 y.o. G1P0000 at [redacted]w[redacted]d being seen today for ongoing prenatal care.  She is currently monitored for the following issues for this high-risk pregnancy and has Obesity affecting pregnancy; Cardiomegaly; Gastroesophageal reflux disease; Vasovagal syncope; Simple ovarian cyst; Dysuria; Gonorrhea; Chlamydia; Routine screening for STI (sexually transmitted infection); Vaginal irritation; HSV infection; Positive pregnancy test; GBS bacteriuria; Adjustment disorder with depressed mood; Abnormal antibody titer; Supervision of normal pregnancy; First trimester pregnancy; Supervision of high risk pregnancy, antepartum; Rh negative state in antepartum period; Asymptomatic bacteriuria during pregnancy in second trimester; [redacted] weeks gestation of pregnancy; and Cardiomyopathy (HCC) on their problem list.  Patient doing well with no acute concerns today. She reports no complaints.  Contractions: Not present. Vag. Bleeding: None.  Movement: Present. Denies leaking of fluid.   Pt recently seen by Dr. Servando Salina with cardiology. Pt is compliant with toprol at this time.  No further recommendations seen other than testing after delivery  The following portions of the patient's history were reviewed and updated as appropriate: allergies, current medications, past family history, past medical history, past social history, past surgical history and problem list. Problem list updated.  Objective:   Vitals:   02/05/21 0931  BP: 113/74  Pulse: 79  Weight: 296 lb 8 oz (134.5 kg)    Fetal Status: Fetal Heart Rate (bpm): 127 Fundal Height: 27 cm Movement: Present     General:  Alert, oriented and cooperative. Patient is in no acute distress.  Skin: Skin is warm and dry. No rash noted.   Cardiovascular: Normal heart rate noted  Respiratory: Normal respiratory effort, no problems with respiration noted  Abdomen: Soft, gravid, appropriate for gestational age.   Pain/Pressure: Absent     Pelvic: Cervical exam deferred        Extremities: Normal range of motion.  Edema: None  Mental Status:  Normal mood and affect. Normal behavior. Normal judgment and thought content.   Assessment and Plan:  Pregnancy: G1P0000 at [redacted]w[redacted]d  1. [redacted] weeks gestation of pregnancy Also will give rhogam today - Tdap vaccine greater than or equal to 7yo IM  2. Obesity affecting pregnancy in second trimester   3. GBS bacteriuria Noted prior to pregnancy, mew guidelines suggest treatment 4. Supervision of high risk pregnancy, antepartum Continue routine care  5. Rh negative state in antepartum period Rhogam given today - rho (d) immune globulin (RHIG/RHOPHYLAC) injection 300 mcg  6. Cardiomyopathy, unspecified type (HCC) Followed by [facility] cardiology EF 40-45%, pt compliant with toprol  Preterm labor symptoms and general obstetric precautions including but not limited to vaginal bleeding, contractions, leaking of fluid and fetal movement were reviewed in detail with the patient.  Please refer to After Visit Summary for other counseling recommendations.   Return in about 2 weeks (around 02/19/2021) for [facility], in person, 2 hr GTT, 3rd trim labs.   Mariel Aloe, MD Faculty Attending Center for [facility]

## 2021-02-12 ENCOUNTER — Other Ambulatory Visit: Payer: Self-pay | Admitting: *Deleted

## 2021-02-12 ENCOUNTER — Other Ambulatory Visit: Payer: Self-pay | Admitting: Obstetrics

## 2021-02-12 ENCOUNTER — Ambulatory Visit: Payer: Medicaid Other | Admitting: *Deleted

## 2021-02-12 ENCOUNTER — Ambulatory Visit (HOSPITAL_BASED_OUTPATIENT_CLINIC_OR_DEPARTMENT_OTHER): Payer: Medicaid Other | Admitting: Obstetrics and Gynecology

## 2021-02-12 ENCOUNTER — Encounter: Payer: Self-pay | Admitting: *Deleted

## 2021-02-12 ENCOUNTER — Other Ambulatory Visit: Payer: Self-pay

## 2021-02-12 ENCOUNTER — Ambulatory Visit: Payer: Medicaid Other | Attending: Obstetrics

## 2021-02-12 VITALS — BP 104/64 | HR 64

## 2021-02-12 DIAGNOSIS — O99412 Diseases of the circulatory system complicating pregnancy, second trimester: Secondary | ICD-10-CM

## 2021-02-12 DIAGNOSIS — O365930 Maternal care for other known or suspected poor fetal growth, third trimester, not applicable or unspecified: Secondary | ICD-10-CM | POA: Diagnosis not present

## 2021-02-12 DIAGNOSIS — I519 Heart disease, unspecified: Secondary | ICD-10-CM | POA: Diagnosis not present

## 2021-02-12 DIAGNOSIS — O0990 Supervision of high risk pregnancy, unspecified, unspecified trimester: Secondary | ICD-10-CM

## 2021-02-12 DIAGNOSIS — E669 Obesity, unspecified: Secondary | ICD-10-CM

## 2021-02-12 DIAGNOSIS — I255 Ischemic cardiomyopathy: Secondary | ICD-10-CM | POA: Diagnosis not present

## 2021-02-12 DIAGNOSIS — Z3A28 28 weeks gestation of pregnancy: Secondary | ICD-10-CM

## 2021-02-12 DIAGNOSIS — O99213 Obesity complicating pregnancy, third trimester: Secondary | ICD-10-CM | POA: Diagnosis not present

## 2021-02-12 DIAGNOSIS — O99333 Smoking (tobacco) complicating pregnancy, third trimester: Secondary | ICD-10-CM

## 2021-02-12 DIAGNOSIS — O99413 Diseases of the circulatory system complicating pregnancy, third trimester: Secondary | ICD-10-CM | POA: Insufficient documentation

## 2021-02-12 DIAGNOSIS — Z3689 Encounter for other specified antenatal screening: Secondary | ICD-10-CM

## 2021-02-12 NOTE — Procedures (Signed)
Jamie Gates Feb 03, 2001 [redacted]w[redacted]d  Fetus A Non-Stress Test Interpretation for 02/12/21  Indication:  IUGR  Fetal Heart Rate A Mode: External Baseline Rate (A): 130 bpm Variability: Moderate Accelerations: 15 x 15 Decelerations: None  Uterine Activity Mode: Toco Contraction Frequency (min): none Resting Tone Palpated: Relaxed  Interpretation (Fetal Testing) Nonstress Test Interpretation: Reactive Overall Impression: Reassuring for gestational age Comments: tracing reviewed by Dr. Judeth Cornfield

## 2021-02-12 NOTE — Progress Notes (Signed)
Maternal-Fetal Medicine   Name: Jamie Gates DOB: Aug 08, 2000 MRN: [id number] Referring Provider: Mariel Aloe, MD  Jamie Gates P0 at 28w 2d gestation returned for fetal growth assessment.  On 01/10/21, the patient was seen by her cardiologist, Dr. Servando Salina.  From her note I see that she suspects nonischemic cardiomyopathy as a cause of decreased left ventricular ejection fraction.  Patient does not have symptoms of shortness of breath now.  Her blood pressure today at her office is 104/64 mmHg.  She will be undergoing screening for gestational diabetes next week.  Ultrasound On today's ultrasound, the estimated fetal weight is at the 7th percentile.  Interval weight gain is 363 g (suboptimal weight gain).  Amniotic fluid is normal and good fetal activity seen.  Umbilical artery showed intermittent absent end-diastolic flow (mostly increased S/D ratio). NST is reactive for this gestational age.  Early onset fetal growth restriction - Placental insufficiency is the most-likely cause. Other possible causes of fetal growth restriction include fetal chromosomal anomalies or genetic syndromes or fetal infections. In women who develop preeclampsia later in pregnancy, growth restriction may manifest earlier. -Amniocentesis will be able to confirm chromosomal anomalies or some (not all) genetic syndromes. I discussed the procedure and its possible complications including preterm delivery (1 in 500 procedures). Patient opted not to have amniocentesis. -Antenatal corticosteroids help in fetal lung maturity especially if administered about a week before delivery. We will recommend betamethasone if umbilical artery Doppler studies show persistent absent EDF. -I encouraged the patient to get screened for gestational diabetes this week so that she may get betamethasone if indicated. -I discussed ultrasound protocol for monitoring fetal growth restriction. -Timing of delivery will be decided on interval  growth and antenatal testing.  -Antenatal testing has limitations in predicting fetal compromise.  Recommendations -Appointments were made for weekly NST and UA Doppler studies. -Fetal growth in 3 weeks. -BPP from [redacted] weeks gestation. -Patient will make an appointment for GDM screening.    Thank you for consultation.  If you have any questions or concerns, please contact me the [facility].  Consultation including face-to-face (more than 50%) counseling 30 minutes.

## 2021-02-17 ENCOUNTER — Other Ambulatory Visit: Payer: Self-pay | Admitting: *Deleted

## 2021-02-17 DIAGNOSIS — O0990 Supervision of high risk pregnancy, unspecified, unspecified trimester: Secondary | ICD-10-CM

## 2021-02-20 ENCOUNTER — Ambulatory Visit (INDEPENDENT_AMBULATORY_CARE_PROVIDER_SITE_OTHER): Payer: Medicaid Other | Admitting: Obstetrics and Gynecology

## 2021-02-20 ENCOUNTER — Other Ambulatory Visit: Payer: Medicaid Other

## 2021-02-20 ENCOUNTER — Other Ambulatory Visit: Payer: Self-pay

## 2021-02-20 VITALS — BP 114/63 | HR 66 | Wt 294.9 lb

## 2021-02-20 DIAGNOSIS — O0990 Supervision of high risk pregnancy, unspecified, unspecified trimester: Secondary | ICD-10-CM

## 2021-02-20 DIAGNOSIS — O365990 Maternal care for other known or suspected poor fetal growth, unspecified trimester, not applicable or unspecified: Secondary | ICD-10-CM

## 2021-02-20 DIAGNOSIS — R760 Raised antibody titer: Secondary | ICD-10-CM

## 2021-02-20 DIAGNOSIS — Z3401 Encounter for supervision of normal first pregnancy, first trimester: Secondary | ICD-10-CM

## 2021-02-20 DIAGNOSIS — R8271 Bacteriuria: Secondary | ICD-10-CM

## 2021-02-20 DIAGNOSIS — I429 Cardiomyopathy, unspecified: Secondary | ICD-10-CM

## 2021-02-20 DIAGNOSIS — Z3A29 29 weeks gestation of pregnancy: Secondary | ICD-10-CM

## 2021-02-20 DIAGNOSIS — O26899 Other specified pregnancy related conditions, unspecified trimester: Secondary | ICD-10-CM

## 2021-02-20 DIAGNOSIS — Z6791 Unspecified blood type, Rh negative: Secondary | ICD-10-CM

## 2021-02-20 NOTE — Progress Notes (Addendum)
° °  PRENATAL VISIT NOTE  Subjective:  Jamie Gates is a 20 y.o. G1P0000 at [redacted]w[redacted]d being seen today for ongoing prenatal care.  She is currently monitored for the following issues for this high-risk pregnancy and has Obesity affecting pregnancy; Cardiomegaly; Gastroesophageal reflux disease; Vasovagal syncope; Simple ovarian cyst; Dysuria; Gonorrhea; Chlamydia; Routine screening for STI (sexually transmitted infection); Vaginal irritation; HSV infection; Positive pregnancy test; GBS bacteriuria; Adjustment disorder with depressed mood; Abnormal antibody titer; Supervision of normal pregnancy; First trimester pregnancy; Supervision of high risk pregnancy, antepartum; Rh negative state in antepartum period; Asymptomatic bacteriuria during pregnancy in second trimester; [redacted] weeks gestation of pregnancy; Cardiomyopathy (HCC); [redacted] weeks gestation of pregnancy; and Pregnancy affected by fetal growth restriction on their problem list.  Patient doing well with no acute concerns today. She reports no complaints.  Contractions: Not present. Vag. Bleeding: None.  Movement: Present. Denies leaking of fluid.   Pt notes compliance with her cards meds.  The following portions of the patient's history were reviewed and updated as appropriate: allergies, current medications, past family history, past medical history, past social history, past surgical history and problem list. Problem list updated.  Objective:   Vitals:   02/20/21 0817  BP: 114/63  Pulse: 66  Weight: 294 lb 14.4 oz (133.8 kg)    Fetal Status: Fetal Heart Rate (bpm): 150 Fundal Height: 27 cm Movement: Present     General:  Alert, oriented and cooperative. Patient is in no acute distress.  Skin: Skin is warm and dry. No rash noted.   Cardiovascular: Normal heart rate noted  Respiratory: Normal respiratory effort, no problems with respiration noted  Abdomen: Soft, gravid, appropriate for gestational age.  Pain/Pressure: Absent     Pelvic:  Cervical exam deferred        Extremities: Normal range of motion.  Edema: None  Mental Status:  Normal mood and affect. Normal behavior. Normal judgment and thought content.   Assessment and Plan:  Pregnancy: G1P0000 at [redacted]w[redacted]d  1. Encounter for supervision of normal first pregnancy in first trimester error  2. [redacted] weeks gestation of pregnancy   3. Cardiomyopathy, unspecified type (HCC) Review of chart showed EF 40-45%.  Pt notes she is compliant with toprol  4. GBS bacteriuria Treat in labor  5. Abnormal antibody titer Anti lewis noted  6. Supervision of high risk pregnancy, antepartum Continue routine care 2 hour GTT today  7. Rh negative state in antepartum period Pt previously received rhogam this preg, 12/7  8. Pregnancy affected by fetal growth restriction EFW at 7%, pt monitored by MFM, watching for AEDF, may need steroids in the future.  Preterm labor symptoms and general obstetric precautions including but not limited to vaginal bleeding, contractions, leaking of fluid and fetal movement were reviewed in detail with the patient.  Please refer to After Visit Summary for other counseling recommendations.   Return in about 2 weeks (around 03/06/2021).   Mariel Aloe, MD Faculty Attending Center for [facility]

## 2021-02-21 ENCOUNTER — Ambulatory Visit: Payer: Medicaid Other | Admitting: *Deleted

## 2021-02-21 ENCOUNTER — Ambulatory Visit (HOSPITAL_BASED_OUTPATIENT_CLINIC_OR_DEPARTMENT_OTHER): Payer: Medicaid Other | Admitting: *Deleted

## 2021-02-21 ENCOUNTER — Other Ambulatory Visit: Payer: Self-pay | Admitting: *Deleted

## 2021-02-21 ENCOUNTER — Ambulatory Visit: Payer: Medicaid Other | Attending: Obstetrics and Gynecology

## 2021-02-21 ENCOUNTER — Encounter: Payer: Self-pay | Admitting: *Deleted

## 2021-02-21 VITALS — BP 119/58 | HR 70

## 2021-02-21 DIAGNOSIS — O0990 Supervision of high risk pregnancy, unspecified, unspecified trimester: Secondary | ICD-10-CM | POA: Diagnosis not present

## 2021-02-21 DIAGNOSIS — E669 Obesity, unspecified: Secondary | ICD-10-CM

## 2021-02-21 DIAGNOSIS — Z3A29 29 weeks gestation of pregnancy: Secondary | ICD-10-CM | POA: Insufficient documentation

## 2021-02-21 DIAGNOSIS — O365930 Maternal care for other known or suspected poor fetal growth, third trimester, not applicable or unspecified: Secondary | ICD-10-CM

## 2021-02-21 DIAGNOSIS — Z3689 Encounter for other specified antenatal screening: Secondary | ICD-10-CM | POA: Diagnosis not present

## 2021-02-21 DIAGNOSIS — O99213 Obesity complicating pregnancy, third trimester: Secondary | ICD-10-CM | POA: Insufficient documentation

## 2021-02-21 DIAGNOSIS — O99413 Diseases of the circulatory system complicating pregnancy, third trimester: Secondary | ICD-10-CM | POA: Diagnosis not present

## 2021-02-21 DIAGNOSIS — O365990 Maternal care for other known or suspected poor fetal growth, unspecified trimester, not applicable or unspecified: Secondary | ICD-10-CM

## 2021-02-21 LAB — CBC
Hematocrit: 35.5 % (ref 34.0–46.6)
Hemoglobin: 12.2 g/dL (ref 11.1–15.9)
MCH: 30.4 pg (ref 26.6–33.0)
MCHC: 34.4 g/dL (ref 31.5–35.7)
MCV: 89 fL (ref 79–97)
Platelets: 203 10*3/uL (ref 150–450)
RBC: 4.01 x10E6/uL (ref 3.77–5.28)
RDW: 12.4 % (ref 11.7–15.4)
WBC: 11.2 10*3/uL — ABNORMAL HIGH (ref 3.4–10.8)

## 2021-02-21 LAB — GLUCOSE TOLERANCE, 2 HOURS W/ 1HR
Glucose, 1 hour: 102 mg/dL (ref 70–179)
Glucose, 2 hour: 81 mg/dL (ref 70–152)
Glucose, Fasting: 77 mg/dL (ref 70–91)

## 2021-02-21 LAB — ANTIBODY SCREEN

## 2021-02-21 LAB — HIV ANTIBODY (ROUTINE TESTING W REFLEX): HIV Screen 4th Generation wRfx: NONREACTIVE

## 2021-02-21 LAB — AB SCR+ANTIBODY ID: Antibody Screen: POSITIVE — AB

## 2021-02-21 LAB — RPR: RPR Ser Ql: NONREACTIVE

## 2021-02-21 NOTE — Procedures (Signed)
Jamie Gates [birth year] [redacted]w[redacted]d  Fetus A Non-Stress Test Interpretation for 02/21/21  Indication: IUGR  Fetal Heart Rate A Mode: External Baseline Rate (A): 140 bpm Variability: Moderate Accelerations: 10 x 10 Decelerations: Variable Multiple birth?: No  Uterine Activity Mode: Palpation, Toco Contraction Frequency (min): none Resting Tone Palpated: Relaxed  Interpretation (Fetal Testing) Nonstress Test Interpretation: Reactive Overall Impression: Reassuring for gestational age Comments: Dr. Judeth Cornfield reviewed tracing

## 2021-02-27 ENCOUNTER — Other Ambulatory Visit: Payer: Self-pay

## 2021-02-27 ENCOUNTER — Other Ambulatory Visit: Payer: Self-pay | Admitting: Obstetrics and Gynecology

## 2021-02-27 ENCOUNTER — Ambulatory Visit: Payer: Medicaid Other | Attending: Obstetrics and Gynecology

## 2021-02-27 ENCOUNTER — Ambulatory Visit: Payer: Medicaid Other | Admitting: *Deleted

## 2021-02-27 ENCOUNTER — Encounter: Payer: Self-pay | Admitting: *Deleted

## 2021-02-27 VITALS — BP 115/69 | HR 61

## 2021-02-27 DIAGNOSIS — Z3A30 30 weeks gestation of pregnancy: Secondary | ICD-10-CM | POA: Diagnosis not present

## 2021-02-27 DIAGNOSIS — O99413 Diseases of the circulatory system complicating pregnancy, third trimester: Secondary | ICD-10-CM | POA: Diagnosis not present

## 2021-02-27 DIAGNOSIS — O365930 Maternal care for other known or suspected poor fetal growth, third trimester, not applicable or unspecified: Secondary | ICD-10-CM | POA: Diagnosis not present

## 2021-02-27 DIAGNOSIS — Z3689 Encounter for other specified antenatal screening: Secondary | ICD-10-CM

## 2021-02-27 DIAGNOSIS — O99213 Obesity complicating pregnancy, third trimester: Secondary | ICD-10-CM

## 2021-02-27 DIAGNOSIS — O0990 Supervision of high risk pregnancy, unspecified, unspecified trimester: Secondary | ICD-10-CM

## 2021-02-27 MED ORDER — BETAMETHASONE SOD PHOS & ACET 6 (3-3) MG/ML IJ SUSP
12.0000 mg | Freq: Once | INTRAMUSCULAR | Status: AC
Start: 2021-02-27 — End: 2021-02-27
  Administered 2021-02-27: 09:00:00 12 mg via INTRAMUSCULAR

## 2021-02-27 NOTE — Procedures (Signed)
Jamie Gates 15-Apr-2000 [redacted]w[redacted]d  Fetus A Non-Stress Test Interpretation for 02/27/21  Indication: IUGR  Fetal Heart Rate A Mode: External Baseline Rate (A): 140 bpm Variability: Moderate Accelerations: 10 x 10 Decelerations: None Multiple birth?: No  Uterine Activity Mode: Palpation, Toco Contraction Frequency (min): none Resting Tone Palpated: Relaxed  Interpretation (Fetal Testing) Nonstress Test Interpretation: Reactive Overall Impression: Reassuring for gestational age Comments: Dr. Judeth Cornfield reviewed tracing

## 2021-02-28 ENCOUNTER — Ambulatory Visit: Payer: Medicaid Other | Attending: Obstetrics and Gynecology | Admitting: *Deleted

## 2021-02-28 DIAGNOSIS — Z3A30 30 weeks gestation of pregnancy: Secondary | ICD-10-CM | POA: Insufficient documentation

## 2021-02-28 DIAGNOSIS — O365930 Maternal care for other known or suspected poor fetal growth, third trimester, not applicable or unspecified: Secondary | ICD-10-CM | POA: Diagnosis not present

## 2021-02-28 DIAGNOSIS — O99413 Diseases of the circulatory system complicating pregnancy, third trimester: Secondary | ICD-10-CM | POA: Insufficient documentation

## 2021-02-28 MED ORDER — BETAMETHASONE SOD PHOS & ACET 6 (3-3) MG/ML IJ SUSP
12.0000 mg | Freq: Once | INTRAMUSCULAR | Status: AC
  Administered 2021-02-28: 09:00:00 12 mg via INTRAMUSCULAR

## 2021-03-02 NOTE — L&D Delivery Note (Signed)
Jamie Gates is a 21 y.o. female G1P0000 with IUP at [redacted]w[redacted]d admitted for IOL for IUFD.  She progressed with cytotec and FB augmentation to complete and pushed 10 minutes to deliver.  Cord clamping delayed by several minutes then clamped by CNM and cut by FOB.    Delivery Note At 2:06 PM a non-viable female was delivered via Vaginal, Spontaneous (Presentation: LOA).  APGAR: 0, 0; weight pending.   Placenta status:  spontaneous, intact.  Cord: 3 vessels  with the following complications: very tight nuchal cord x1, short cord, both hands presenting with occiput  Anesthesia: Epidural Episiotomy: None Lacerations:  n/a Suture Repair:  n/a Est. Blood Loss (mL):    Mom to postpartum.  Baby to [place].  Rolm Bookbinder CNM 03/15/2021, 2:54 PM

## 2021-03-04 ENCOUNTER — Other Ambulatory Visit: Payer: Self-pay

## 2021-03-04 ENCOUNTER — Ambulatory Visit: Payer: Medicaid Other | Attending: Obstetrics and Gynecology | Admitting: *Deleted

## 2021-03-04 ENCOUNTER — Ambulatory Visit: Payer: Medicaid Other | Admitting: *Deleted

## 2021-03-04 VITALS — BP 112/61 | HR 68

## 2021-03-04 DIAGNOSIS — O365930 Maternal care for other known or suspected poor fetal growth, third trimester, not applicable or unspecified: Secondary | ICD-10-CM

## 2021-03-04 DIAGNOSIS — O0990 Supervision of high risk pregnancy, unspecified, unspecified trimester: Secondary | ICD-10-CM

## 2021-03-04 DIAGNOSIS — Z3689 Encounter for other specified antenatal screening: Secondary | ICD-10-CM | POA: Diagnosis not present

## 2021-03-04 DIAGNOSIS — O99213 Obesity complicating pregnancy, third trimester: Secondary | ICD-10-CM | POA: Diagnosis not present

## 2021-03-04 DIAGNOSIS — Z3A31 31 weeks gestation of pregnancy: Secondary | ICD-10-CM | POA: Insufficient documentation

## 2021-03-04 NOTE — Procedures (Signed)
Jamie Gates December 17, 2000 [redacted]w[redacted]d  Fetus A Non-Stress Test Interpretation for 03/04/21  Indication: IUGR  Fetal Heart Rate A Mode: External Baseline Rate (A): 145 bpm Variability: Moderate Accelerations: 10 x 10 Decelerations: Variable  Uterine Activity Mode: Palpation, Toco Contraction Frequency (min): None Resting Tone Palpated: Relaxed Resting Time: Adequate  Interpretation (Fetal Testing) Nonstress Test Interpretation: Reactive Overall Impression: Reassuring for gestational age Comments: Dr. Parke Poisson reviewed tracing.

## 2021-03-05 ENCOUNTER — Other Ambulatory Visit: Payer: Self-pay

## 2021-03-05 DIAGNOSIS — R0989 Other specified symptoms and signs involving the circulatory and respiratory systems: Secondary | ICD-10-CM

## 2021-03-06 ENCOUNTER — Ambulatory Visit: Payer: Medicaid Other | Admitting: *Deleted

## 2021-03-06 ENCOUNTER — Ambulatory Visit (HOSPITAL_BASED_OUTPATIENT_CLINIC_OR_DEPARTMENT_OTHER): Payer: Medicaid Other

## 2021-03-06 ENCOUNTER — Ambulatory Visit: Payer: Medicaid Other | Attending: Obstetrics and Gynecology

## 2021-03-06 ENCOUNTER — Encounter: Payer: Self-pay | Admitting: *Deleted

## 2021-03-06 ENCOUNTER — Other Ambulatory Visit: Payer: Self-pay

## 2021-03-06 VITALS — BP 107/61 | HR 93

## 2021-03-06 DIAGNOSIS — R0989 Other specified symptoms and signs involving the circulatory and respiratory systems: Secondary | ICD-10-CM

## 2021-03-06 DIAGNOSIS — O99413 Diseases of the circulatory system complicating pregnancy, third trimester: Secondary | ICD-10-CM | POA: Diagnosis not present

## 2021-03-06 DIAGNOSIS — Z3A31 31 weeks gestation of pregnancy: Secondary | ICD-10-CM

## 2021-03-06 DIAGNOSIS — O0990 Supervision of high risk pregnancy, unspecified, unspecified trimester: Secondary | ICD-10-CM | POA: Diagnosis present

## 2021-03-06 DIAGNOSIS — O365930 Maternal care for other known or suspected poor fetal growth, third trimester, not applicable or unspecified: Secondary | ICD-10-CM | POA: Insufficient documentation

## 2021-03-06 DIAGNOSIS — Z3689 Encounter for other specified antenatal screening: Secondary | ICD-10-CM

## 2021-03-06 DIAGNOSIS — O99213 Obesity complicating pregnancy, third trimester: Secondary | ICD-10-CM

## 2021-03-06 LAB — ECHOCARDIOGRAM LIMITED
Area-P 1/2: 3.81 cm2
MV M vel: 5.25 m/s
MV Peak grad: 110.3 mmHg
Radius: 0.7 cm
S' Lateral: 4.45 cm

## 2021-03-06 NOTE — Procedures (Signed)
Jamie Gates 05/21/2000 [redacted]w[redacted]d  Fetus A Non-Stress Test Interpretation for 03/06/21  Indication: IUGR  Fetal Heart Rate A Mode: External Baseline Rate (A): 145 bpm Variability: Moderate Accelerations: 10 x 10 Decelerations: None  Uterine Activity Mode: Toco Contraction Frequency (min): none Resting Tone Palpated: Relaxed  Interpretation (Fetal Testing) Nonstress Test Interpretation: Reactive Overall Impression: Reassuring for gestational age Comments: tracing reviewed by Dr. Judeth Cornfield

## 2021-03-10 ENCOUNTER — Encounter: Payer: Self-pay | Admitting: Obstetrics and Gynecology

## 2021-03-10 ENCOUNTER — Other Ambulatory Visit: Payer: Self-pay

## 2021-03-10 ENCOUNTER — Ambulatory Visit (INDEPENDENT_AMBULATORY_CARE_PROVIDER_SITE_OTHER): Payer: Medicaid Other | Admitting: Obstetrics and Gynecology

## 2021-03-10 VITALS — BP 110/64 | HR 99 | Wt 299.9 lb

## 2021-03-10 DIAGNOSIS — Z6841 Body Mass Index (BMI) 40.0 and over, adult: Secondary | ICD-10-CM

## 2021-03-10 DIAGNOSIS — Z6791 Unspecified blood type, Rh negative: Secondary | ICD-10-CM

## 2021-03-10 DIAGNOSIS — R760 Raised antibody titer: Secondary | ICD-10-CM | POA: Diagnosis not present

## 2021-03-10 DIAGNOSIS — O365930 Maternal care for other known or suspected poor fetal growth, third trimester, not applicable or unspecified: Secondary | ICD-10-CM | POA: Diagnosis not present

## 2021-03-10 DIAGNOSIS — O0990 Supervision of high risk pregnancy, unspecified, unspecified trimester: Secondary | ICD-10-CM

## 2021-03-10 DIAGNOSIS — Z3A32 32 weeks gestation of pregnancy: Secondary | ICD-10-CM | POA: Diagnosis not present

## 2021-03-10 DIAGNOSIS — B009 Herpesviral infection, unspecified: Secondary | ICD-10-CM

## 2021-03-10 DIAGNOSIS — I429 Cardiomyopathy, unspecified: Secondary | ICD-10-CM

## 2021-03-10 DIAGNOSIS — O99213 Obesity complicating pregnancy, third trimester: Secondary | ICD-10-CM | POA: Diagnosis not present

## 2021-03-10 DIAGNOSIS — O26899 Other specified pregnancy related conditions, unspecified trimester: Secondary | ICD-10-CM | POA: Diagnosis not present

## 2021-03-10 DIAGNOSIS — R8271 Bacteriuria: Secondary | ICD-10-CM | POA: Diagnosis not present

## 2021-03-10 MED ORDER — FAMOTIDINE 40 MG PO TABS: 40.0000 mg | ORAL_TABLET | Freq: Every day | ORAL | 0 refills | Status: DC

## 2021-03-10 NOTE — Progress Notes (Signed)
PRENATAL VISIT NOTE  Subjective:  Jamie Gates is a 21 y.o. G1P0000 at [redacted]w[redacted]d being seen today for ongoing prenatal care.  She is currently monitored for the following issues for this high-risk pregnancy and has Obesity affecting pregnancy; Cardiomegaly; Gastroesophageal reflux disease; Vasovagal syncope; Simple ovarian cyst; Dysuria; HSV infection; GBS bacteriuria; Adjustment disorder with depressed mood; Abnormal antibody titer; Supervision of normal pregnancy; Supervision of high risk pregnancy, antepartum; Rh negative state in antepartum period; Asymptomatic bacteriuria during pregnancy in second trimester; Cardiomyopathy ([place]); and Poor fetal growth affecting management of mother in third trimester on their problem list.  Patient reports no complaints.  Contractions: Not present. Vag. Bleeding: None.  Movement: Present. Denies leaking of fluid.   The following portions of the patient's history were reviewed and updated as appropriate: allergies, current medications, past family history, past medical history, past social history, past surgical history and problem list.   Objective:   Vitals:   03/10/21 1121  BP: 110/64  Pulse: 99  Weight: 299 lb 14.4 oz (136 kg)    Fetal Status: Fetal Heart Rate (bpm): 140   Movement: Present     General:  Alert, oriented and cooperative. Patient is in no acute distress.  Skin: Skin is warm and dry. No rash noted.   Cardiovascular: Normal heart rate noted  Respiratory: Normal respiratory effort, no problems with respiration noted  Abdomen: Soft, gravid, appropriate for gestational age.  Pain/Pressure: Absent     Pelvic: Cervical exam deferred        Extremities: Normal range of motion.  Edema: None  Mental Status: Normal mood and affect. Normal behavior. Normal judgment and thought content.   Assessment and Plan:  Pregnancy: G1P0000 at [redacted]w[redacted]d 1. Cardiomyopathy, unspecified type [facility]) Followed by Dr. Harriet Masson, last visit November 2022 with plan  for 50m RTC. Patient confirms on toprol-xl 25mg  qday. Pt had recent follow up echo on 1/5 that shows CM resolved: IMPRESSIONS     1. Left ventricular ejection fraction, by estimation, is 50 to 55%. Left  ventricular ejection fraction by 3D volume is 51 %. The left ventricle has  low normal function. The left ventricle has no regional wall motion  abnormalities. The left ventricular  internal cavity size was mildly dilated. Left ventricular diastolic  parameters were normal. The average left ventricular global longitudinal  strain is 19.3 %. The global longitudinal strain is abnormal.   2. Right ventricular systolic function is normal. The right ventricular  size is normal. There is normal pulmonary artery systolic pressure. The  estimated right ventricular systolic pressure is 0000000 mmHg.   3. The pericardial effusion is posterior to the left ventricle.   4. The mitral valve is abnormal. Moderate to severe mitral valve  regurgitation.   5. The aortic valve is tricuspid. Aortic valve regurgitation is not  visualized.   6. The inferior vena cava is normal in size with greater than 50%  respiratory variability, suggesting right atrial pressure of 3 mmHg.   7. Evidence of atrial level shunting detected by color flow Doppler.  There is a small patent foramen ovale with predominantly left to right  shunting across the atrial septum.   Will forward note from today to Dr. Harriet Masson, but pt should be fine, from a cardiac standpoint, for her delivery  2. [redacted] weeks gestation of pregnancy  3. Poor fetal growth affecting management of mother in third trimester, single or unspecified fetus 1/5 growth u/s with persistent FGR and AEDF: 1341gm, efw 1.7%, AC <  1%, bpp 10/10, afi [id number], cephalic. MFM recommends delivery at 33-34 weeks, which has not been arranged. Pt has repeat u/s on 1/13. Will plan to bring patient inpatient after her 0715 mfm u/s for rescue steroids (bmz on 12/29 and 12/30) and plan for delivery  on Monday at 33wks. I told her that will decide over the weekend on delivery route, but even if cephalic, c-section may be best given risk of fetal intolerance of labor (see below)  4. GBS bacteriuria Tx in labor  5. HSV infection Per chart, outbreak at 28wks and pt doesn't want this discussed in front of partner. Partner present today. I will touch base with mfm and see if given recent infection if that alone is an indication to offer/recommend a primary c-section  6. Obesity affecting pregnancy in third trimester  7. BMI 40.0-44.9, adult ([place])  8. Rh negative state in antepartum period S/p rhogam on 12/7  9. Abnormal antibody titer +anti-lewis antibodies (no issue w/ pregnancy). F/u hospital t&s. May need to crossmatch blood if still +antibodies on admit  10. Supervision of high risk pregnancy, antepartum  11. Right ovarian cyst 10x6cm on 03/06/21; simple appearing. Can assess if for c-section, and/or rpt u/s 6-12 weeks pp   Preterm labor symptoms and general obstetric precautions including but not limited to vaginal bleeding, contractions, leaking of fluid and fetal movement were reviewed in detail with the patient. Please refer to After Visit Summary for other counseling recommendations.   No follow-ups on file.  Future Appointments  Date Time Provider [place]  03/14/2021  7:15 AM [facility] NURSE [facility]  03/14/2021  7:30 AM [facility] US3 [facility]  03/14/2021  8:45 AM [facility] NST [facility]  03/21/2021  8:30 AM [facility] NURSE [facility]  03/21/2021  8:45 AM [facility] US6 [facility]  03/21/2021  9:45 AM [facility] NST [facility]  03/24/2021  1:35 PM Anyanwu, Sallyanne Havers, MD [facility]  04/08/2021 10:40 AM Tobb, Godfrey Pick, DO [facility]    Aletha Halim, MD

## 2021-03-11 ENCOUNTER — Other Ambulatory Visit: Payer: Self-pay | Admitting: Obstetrics and Gynecology

## 2021-03-11 ENCOUNTER — Telehealth: Payer: Self-pay | Admitting: Obstetrics and Gynecology

## 2021-03-11 DIAGNOSIS — O365990 Maternal care for other known or suspected poor fetal growth, unspecified trimester, not applicable or unspecified: Secondary | ICD-10-CM | POA: Insufficient documentation

## 2021-03-11 DIAGNOSIS — I340 Nonrheumatic mitral (valve) insufficiency: Secondary | ICD-10-CM

## 2021-03-11 DIAGNOSIS — O99419 Diseases of the circulatory system complicating pregnancy, unspecified trimester: Secondary | ICD-10-CM

## 2021-03-11 DIAGNOSIS — Z8759 Personal history of other complications of pregnancy, childbirth and the puerperium: Secondary | ICD-10-CM | POA: Insufficient documentation

## 2021-03-11 MED ORDER — CEFAZOLIN SODIUM-DEXTROSE 2-4 GM/100ML-% IV SOLN: 2.0000 g | INTRAVENOUS | Status: DC

## 2021-03-11 MED ORDER — LACTATED RINGERS IV SOLN
INTRAVENOUS | Status: DC
Start: 2021-03-11 — End: 2021-03-11

## 2021-03-11 MED ORDER — SOD CITRATE-CITRIC ACID 500-334 MG/5ML PO SOLN: 30.0000 mL | ORAL | Status: DC

## 2021-03-11 NOTE — Telephone Encounter (Addendum)
OB Telephone Note  I d/w mfm (Dr. Judeth Cornfield) yesterday and he  recommends c-section if she had a primary hsv outbreak at 24weeks; they agree with delivery next week and rescue steroids beforehand. I called the patient she states that that was the 1st time she had ever had an outbreak so I told her I would consider that a primary outbreak so she would need delivery via c-section. I told him she has her next u/s on Friday morning and I would have her come down after that u/s for rescue steroids #1 and then on Saturday in the MAU and then have her come in on Monday for delivery and Dr. Judeth Cornfield thought that was fine.  I also heard from Dr. Servando Salina and plan is keep on toprol given new moderate to severe MR on 1/5 echo; plan is to keep on toprol until she potentially weans her off at an outpatient PP visit. In the interim, she recommends lasix 20mg  qod if she has any SOB pre delivery and she recommends a short course of diuretics after delivery.   I told the patient to come downstairs Friday morning after her mfm u/s to get steroid shot #1 and then we'll set up for her to come to MAU Saturday morning for steroid shot #2  Request sent to OR for Monday c-section.  Neonatologist called and they put the patient's information on the board to be aware of the baby.   Pt is amenable to plan  Friday MD Attending Center for [facility] (Faculty Practice) 03/11/2021 Time: [phone]

## 2021-03-12 ENCOUNTER — Encounter: Payer: Self-pay | Admitting: Obstetrics and Gynecology

## 2021-03-14 ENCOUNTER — Other Ambulatory Visit: Payer: Self-pay

## 2021-03-14 ENCOUNTER — Ambulatory Visit: Payer: Medicaid Other | Admitting: *Deleted

## 2021-03-14 ENCOUNTER — Ambulatory Visit: Payer: Medicaid Other

## 2021-03-14 ENCOUNTER — Encounter (HOSPITAL_COMMUNITY): Payer: Self-pay | Admitting: Family Medicine

## 2021-03-14 ENCOUNTER — Ambulatory Visit (HOSPITAL_BASED_OUTPATIENT_CLINIC_OR_DEPARTMENT_OTHER): Payer: Medicaid Other

## 2021-03-14 ENCOUNTER — Inpatient Hospital Stay (HOSPITAL_COMMUNITY)
Admission: AD | Admit: 2021-03-14 | Discharge: 2021-03-15 | DRG: 807 | Disposition: A | Discharge status: Home / Self Care | Payer: Medicaid Other | Attending: Obstetrics and Gynecology | Admitting: Obstetrics and Gynecology

## 2021-03-14 ENCOUNTER — Other Ambulatory Visit: Payer: Self-pay | Admitting: Obstetrics and Gynecology

## 2021-03-14 VITALS — BP 124/64 | HR 79

## 2021-03-14 DIAGNOSIS — E669 Obesity, unspecified: Secondary | ICD-10-CM | POA: Diagnosis not present

## 2021-03-14 DIAGNOSIS — Z87891 Personal history of nicotine dependence: Secondary | ICD-10-CM

## 2021-03-14 DIAGNOSIS — O99824 Streptococcus B carrier state complicating childbirth: Secondary | ICD-10-CM | POA: Diagnosis present

## 2021-03-14 DIAGNOSIS — O99213 Obesity complicating pregnancy, third trimester: Secondary | ICD-10-CM | POA: Diagnosis not present

## 2021-03-14 DIAGNOSIS — Z6791 Unspecified blood type, Rh negative: Secondary | ICD-10-CM

## 2021-03-14 DIAGNOSIS — O365990 Maternal care for other known or suspected poor fetal growth, unspecified trimester, not applicable or unspecified: Secondary | ICD-10-CM | POA: Insufficient documentation

## 2021-03-14 DIAGNOSIS — Z3A32 32 weeks gestation of pregnancy: Secondary | ICD-10-CM

## 2021-03-14 DIAGNOSIS — O26893 Other specified pregnancy related conditions, third trimester: Secondary | ICD-10-CM | POA: Diagnosis not present

## 2021-03-14 DIAGNOSIS — O364XX1 Maternal care for intrauterine death, fetus 1: Secondary | ICD-10-CM | POA: Diagnosis not present

## 2021-03-14 DIAGNOSIS — Z20822 Contact with and (suspected) exposure to covid-19: Secondary | ICD-10-CM | POA: Diagnosis not present

## 2021-03-14 DIAGNOSIS — O0990 Supervision of high risk pregnancy, unspecified, unspecified trimester: Secondary | ICD-10-CM

## 2021-03-14 DIAGNOSIS — O365930 Maternal care for other known or suspected poor fetal growth, third trimester, not applicable or unspecified: Secondary | ICD-10-CM

## 2021-03-14 DIAGNOSIS — O99820 Streptococcus B carrier state complicating pregnancy: Secondary | ICD-10-CM | POA: Diagnosis not present

## 2021-03-14 DIAGNOSIS — O364XX0 Maternal care for intrauterine death, not applicable or unspecified: Secondary | ICD-10-CM | POA: Diagnosis not present

## 2021-03-14 LAB — PROTEIN / CREATININE RATIO, URINE
Creatinine, Urine: 81.8 mg/dL
Protein Creatinine Ratio: 0.11 mg/mg{Cre} (ref 0.00–0.15)
Total Protein, Urine: 9 mg/dL

## 2021-03-14 LAB — COMPREHENSIVE METABOLIC PANEL
ALT: 13 U/L (ref 0–44)
AST: 15 U/L (ref 15–41)
Albumin: 2.8 g/dL — ABNORMAL LOW (ref 3.5–5.0)
Alkaline Phosphatase: 180 U/L — ABNORMAL HIGH (ref 38–126)
Anion gap: 8 (ref 5–15)
BUN: 10 mg/dL (ref 6–20)
CO2: 22 mmol/L (ref 22–32)
Calcium: 8.8 mg/dL — ABNORMAL LOW (ref 8.9–10.3)
Chloride: 106 mmol/L (ref 98–111)
Creatinine, Ser: 0.65 mg/dL (ref 0.44–1.00)
GFR, Estimated: >60 mL/min (ref >60)
Glucose, Bld: 80 mg/dL (ref 70–99)
Potassium: 3.8 mmol/L (ref 3.5–5.1)
Sodium: 136 mmol/L (ref 135–145)
Total Bilirubin: 0.3 mg/dL (ref 0.3–1.2)
Total Protein: 6.8 g/dL (ref 6.5–8.1)

## 2021-03-14 LAB — CBC
HCT: 38.1 % (ref 36.0–46.0)
Hemoglobin: 13.1 g/dL (ref 12.0–15.0)
MCH: 30.8 pg (ref 26.0–34.0)
MCHC: 34.4 g/dL (ref 30.0–36.0)
MCV: 89.4 fL (ref 80.0–100.0)
Platelets: 200 10*3/uL (ref 150–400)
RBC: 4.26 MIL/uL (ref 3.87–5.11)
RDW: 13.2 % (ref 11.5–15.5)
WBC: 9.9 10*3/uL (ref 4.0–10.5)
nRBC: 0 % (ref 0.0–0.2)

## 2021-03-14 LAB — RESP PANEL BY RT-PCR (FLU A&B, COVID) ARPGX2
Influenza A by PCR: NEGATIVE
Influenza B by PCR: NEGATIVE
SARS Coronavirus 2 by RT PCR: NEGATIVE

## 2021-03-14 MED ORDER — ONDANSETRON HCL 4 MG/2ML IJ SOLN: 4.0000 mg | Freq: Four times a day (QID) | INTRAMUSCULAR | Status: DC | PRN

## 2021-03-14 MED ORDER — SOD CITRATE-CITRIC ACID 500-334 MG/5ML PO SOLN: 30.0000 mL | ORAL | Status: DC | PRN

## 2021-03-14 MED ORDER — ACETAMINOPHEN 325 MG PO TABS: 650.0000 mg | ORAL_TABLET | ORAL | Status: DC | PRN

## 2021-03-14 MED ORDER — OXYCODONE-ACETAMINOPHEN 5-325 MG PO TABS: 1.0000 | ORAL_TABLET | ORAL | Status: DC | PRN

## 2021-03-14 MED ORDER — MISOPROSTOL 200 MCG PO TABS
200.0000 ug | ORAL_TABLET | ORAL | Status: DC | PRN
  Administered 2021-03-14 (×2): 200 ug via BUCCAL
  Filled 2021-03-14: qty 1

## 2021-03-14 MED ORDER — LIDOCAINE HCL (PF) 1 % IJ SOLN: 30.0000 mL | INTRAMUSCULAR | Status: DC | PRN

## 2021-03-14 MED ORDER — FENTANYL CITRATE (PF) 100 MCG/2ML IJ SOLN: 50.0000 ug | INTRAMUSCULAR | Status: DC | PRN

## 2021-03-14 MED ORDER — OXYCODONE-ACETAMINOPHEN 5-325 MG PO TABS: 2.0000 | ORAL_TABLET | ORAL | Status: DC | PRN

## 2021-03-14 MED ORDER — LACTATED RINGERS IV SOLN: INTRAVENOUS | Status: DC

## 2021-03-14 MED ORDER — MISOPROSTOL 50MCG HALF TABLET
50.0000 ug | ORAL_TABLET | ORAL | Status: AC | PRN
  Administered 2021-03-14: 50 ug via BUCCAL
  Filled 2021-03-14: qty 1

## 2021-03-14 MED ORDER — OXYTOCIN BOLUS FROM INFUSION
333.0000 mL | Freq: Once | INTRAVENOUS | Status: AC
  Administered 2021-03-15: 333 mL via INTRAVENOUS

## 2021-03-14 MED ORDER — LACTATED RINGERS IV SOLN: 500.0000 mL | INTRAVENOUS | Status: DC | PRN

## 2021-03-14 MED ORDER — OXYTOCIN-SODIUM CHLORIDE 30-0.9 UT/500ML-% IV SOLN
2.5000 [IU]/h | INTRAVENOUS | Status: DC
  Administered 2021-03-15: 2.5 [IU]/h via INTRAVENOUS
  Filled 2021-03-14: qty 500

## 2021-03-14 MED ORDER — HYDROXYZINE HCL 50 MG PO TABS
50.0000 mg | ORAL_TABLET | Freq: Four times a day (QID) | ORAL | Status: DC | PRN
  Administered 2021-03-14 – 2021-03-15 (×4): 50 mg via ORAL
  Filled 2021-03-14 (×4): qty 1

## 2021-03-14 MED ORDER — MISOPROSTOL 200 MCG PO TABS
200.0000 ug | ORAL_TABLET | ORAL | Status: DC | PRN
  Filled 2021-03-14: qty 1

## 2021-03-14 NOTE — H&P (Signed)
Jamie Gates is a 21 y.o. female at [redacted]w[redacted]d presenting for IOL for IUFD. Has been followed by MFM with weekly NST/BPP for severe FGR and has a history of cardiomyopathy (diagnosed as a child, asymptomatic) with an EF of 40-45% and had been cleared by cardio. No history of GDM, blood pressures normal in pregnancy. Is complaining of black spots in her visual field today but denies headache, epigastric or LUQ pain. Has been having mild BH for the past two days.  Last NST was 10/10 on 03/06/21, had abnormal Dopplers with mostly absent (some intermittent) end-diastolic flow with plans to deliver at 33-34wks. Went to her NST today and FHR was absent. Sent directly to L&D for admission and IOL.  Has positive antibody titers for Anti-Lewis a OB History     Gravida  1   Para  0   Term  0   Preterm  0   AB  0   Living  0      SAB  0   IAB  0   Ectopic  0   Multiple  0   Live Births  0          Past Medical History:  Diagnosis Date   Allergy    Cardiomegaly 12/18/2012   still working with cardiology-esp now with pregnancy   Chlamydia 09/23/2018   Treated 09/23/2018   Gonorrhea 09/23/2018   Treated 09/23/2018   Obesity    Ovarian cyst    Past Surgical History:  Procedure Laterality Date   TYMPANOSTOMY TUBE PLACEMENT     Family History: family history includes Diabetes in her father; Hyperlipidemia in her father; Hypertension in her father; Raynaud syndrome in her mother. Social History:  reports that she quit smoking about 6 months ago. Her smoking use included cigars and cigarettes. She has never used smokeless tobacco. She reports that she does not drink alcohol and does not use drugs.    Maternal Diabetes: No Genetic Screening: Normal Maternal Ultrasounds/Referrals: Normal Fetal Ultrasounds or other Referrals:  Referred to Materal Fetal Medicine  Maternal Substance Abuse:  No Significant Maternal Medications:  None Significant Maternal Lab Results:  Group B Strep  positive, Rh negative, and Other: HSV+ Other Comments:   Anti-Lewis A  Review of Systems Maternal Medical History:  Fetal activity: Perceived fetal activity is none.   Prenatal complications: IUGR.   Prenatal Complications - Diabetes: none.  Dilation: 1 Effacement (%): Thick Exam by:: Travian Kerner CNM Blood pressure 134/80, pulse 83, temperature 98.6 F (37 C), temperature source Oral, resp. rate 15, last menstrual period 07/29/2020. Maternal Exam:  Abdomen: Patient reports no abdominal tenderness. Fetal presentation: vertex Introitus: Normal vulva. Normal vagina.  Pelvis: adequate for delivery.   Cervix: Cervix evaluated by digital exam.    Physical Exam Vitals and nursing note reviewed.  Constitutional:      General: She is not in acute distress.    Appearance: Normal appearance. She is not ill-appearing.  HENT:     Head: Normocephalic and atraumatic.     Mouth/Throat:     Mouth: Mucous membranes are moist.  Eyes:     Pupils: Pupils are equal, round, and reactive to light.  Cardiovascular:     Rate and Rhythm: Normal rate and regular rhythm.     Pulses: Normal pulses.  Pulmonary:     Effort: Pulmonary effort is normal.  Abdominal:     Palpations: Abdomen is soft.     Tenderness: There is no abdominal tenderness.  Genitourinary:  General: Normal vulva.     Comments: Dilation: 1 Effacement (%): Thick Cervical Position: Posterior Presentation: Vertex (by ultrasound) Exam by:: Mandy Fitzwater CNM  Musculoskeletal:        General: Normal range of motion.  Skin:    General: Skin is warm.     Capillary Refill: Capillary refill takes less than 2 seconds.  Neurological:     Mental Status: She is alert and oriented to person, place, and time.  Psychiatric:        Mood and Affect: Mood normal.        Behavior: Behavior normal.        Thought Content: Thought content normal.        Judgment: Judgment normal.    Prenatal labs: ABO, Rh: --/--/PENDING (01/13 1015) (B  negative) Antibody: PENDING (01/13 1015) (Anti-Lewis A) Rubella: 4.39 (07/20 1007) RPR: Non Reactive (12/22 0836)  HBsAg: Negative (07/20 1007)  HIV: Non Reactive (12/22 0836)  GBS: Positive/-- (07/20 0000)   Assessment/Plan: G1P0000 at [redacted]w[redacted]d  IOL for IUFD - gave condolences and copious reassurance, ordered anti-anxiety meds and pt can have epidural whenever desired - offered Anora and autopsy, pt & FOB initially declined, will revisit with Anora - will begin IOL with 200mg  buccal cytotec - TORCH labs and KB ordered/pending - BP normal but elevated, given visual disturbances will order CMP and P:Cr  , CNM, MSN, IBCLC Certified Nurse Midwife, [facility]

## 2021-03-14 NOTE — Progress Notes (Signed)
Labor progress note   S: Reports she is doing well, having some cramping. She has her boyfriend and mother at bedside for support. Trying to get a nap in.   O: Blood pressure (!) 110/51, pulse 76, temperature 98.8 F (37.1 C), temperature source Oral, resp. rate 16, last menstrual period 07/29/2020.   Dilation: 1.5 Effacement (%): 60 Cervical Position: [place]: -2 Presentation: Vertex Exam by:: Boone Master, RN   A/P:  Labor: Additional buccal cytotec (switched to ) given around 2045. She has already received 200 x2. May consider FB placement, however will wait until next check to re-discuss per patient preference.   She would like to hold her infant after delivery.   Allayne Stack, DO

## 2021-03-14 NOTE — Progress Notes (Signed)
Chaplain Tery Sanfilippo accompanied Chaplain Jon Gills to OB/GYN following a fetal demise. The baby's father Jamie Gates) was in the room and chaplains offered condolences. Patient was lying in bed and indicated that she doing ok. Father responded as well that he was ok. Before leaving, we asked if anything was needed. Patient thanked chaplains for coming to check on her.  This note was prepared by Resident Jon Gills. Please call [phone] if there are questions or page if additional support is needed.      03/14/21 1700  Clinical Encounter Type  Visited With Patient and family together  Visit Type Initial  Referral From Nurse  Spiritual Encounters  Spiritual Needs Emotional;Grief support

## 2021-03-15 ENCOUNTER — Inpatient Hospital Stay (HOSPITAL_COMMUNITY): Payer: Medicaid Other | Admitting: Anesthesiology

## 2021-03-15 ENCOUNTER — Encounter (HOSPITAL_COMMUNITY): Payer: Self-pay | Admitting: Family Medicine

## 2021-03-15 ENCOUNTER — Encounter (HOSPITAL_COMMUNITY)
Admission: AD | Disposition: A | Discharge status: Home / Self Care | Payer: Self-pay | Source: Home / Self Care | Attending: Obstetrics and Gynecology

## 2021-03-15 DIAGNOSIS — I340 Nonrheumatic mitral (valve) insufficiency: Secondary | ICD-10-CM | POA: Diagnosis not present

## 2021-03-15 DIAGNOSIS — O43813 Placental infarction, third trimester: Secondary | ICD-10-CM | POA: Diagnosis not present

## 2021-03-15 DIAGNOSIS — O364XX1 Maternal care for intrauterine death, fetus 1: Secondary | ICD-10-CM | POA: Diagnosis not present

## 2021-03-15 DIAGNOSIS — Z3A32 32 weeks gestation of pregnancy: Secondary | ICD-10-CM | POA: Diagnosis not present

## 2021-03-15 DIAGNOSIS — O9942 Diseases of the circulatory system complicating childbirth: Secondary | ICD-10-CM | POA: Diagnosis not present

## 2021-03-15 DIAGNOSIS — O99820 Streptococcus B carrier state complicating pregnancy: Secondary | ICD-10-CM | POA: Diagnosis not present

## 2021-03-15 DIAGNOSIS — O365930 Maternal care for other known or suspected poor fetal growth, third trimester, not applicable or unspecified: Secondary | ICD-10-CM | POA: Diagnosis not present

## 2021-03-15 LAB — KLEIHAUER-BETKE STAIN
# Vials RhIg: 1
Fetal Cells %: 0 %
Quantitation Fetal Hemoglobin: 0 mL

## 2021-03-15 LAB — RPR: RPR Ser Ql: NONREACTIVE

## 2021-03-15 LAB — HSV 1 ANTIBODY, IGG: HSV 1 Glycoprotein G Ab, IgG: 44.7 index — ABNORMAL HIGH (ref 0.00–0.90)

## 2021-03-15 LAB — CMV ANTIBODY, IGG (EIA): CMV Ab - IgG: 5.1 U/mL — ABNORMAL HIGH (ref 0.00–0.59)

## 2021-03-15 LAB — RUBELLA SCREEN: Rubella: 4.48 index (ref >0.99)

## 2021-03-15 LAB — TOXOPLASMA GONDII ANTIBODY, IGG: Toxoplasma IgG Ratio: <3 IU/mL (ref 0.0–7.1)

## 2021-03-15 SURGERY — Surgical Case
Anesthesia: Epidural

## 2021-03-15 MED ORDER — FENTANYL-BUPIVACAINE-NACL 0.5-0.125-0.9 MG/250ML-% EP SOLN
12.0000 mL/h | EPIDURAL | Status: DC | PRN
  Administered 2021-03-15: 12 mL/h via EPIDURAL

## 2021-03-15 MED ORDER — LACTATED RINGERS IV SOLN
500.0000 mL | Freq: Once | INTRAVENOUS | Status: AC
  Administered 2021-03-15: 500 mL via INTRAVENOUS

## 2021-03-15 MED ORDER — OXYTOCIN-SODIUM CHLORIDE 30-0.9 UT/500ML-% IV SOLN: 1.0000 m[IU]/min | INTRAVENOUS | Status: DC

## 2021-03-15 MED ORDER — EPHEDRINE 5 MG/ML INJ: 10.0000 mg | INTRAVENOUS | Status: DC | PRN

## 2021-03-15 MED ORDER — IBUPROFEN 600 MG PO TABS
600.0000 mg | ORAL_TABLET | Freq: Four times a day (QID) | ORAL | Status: DC
  Administered 2021-03-15: 600 mg via ORAL
  Filled 2021-03-15: qty 1

## 2021-03-15 MED ORDER — HYDROXYZINE PAMOATE 50 MG PO CAPS
50.0000 mg | ORAL_CAPSULE | Freq: Three times a day (TID) | ORAL | 0 refills | Status: DC | PRN
Start: 2021-03-15 — End: 2021-12-10

## 2021-03-15 MED ORDER — PHENYLEPHRINE 40 MCG/ML (10ML) SYRINGE FOR IV PUSH (FOR BLOOD PRESSURE SUPPORT): 80.0000 ug | PREFILLED_SYRINGE | INTRAVENOUS | Status: DC | PRN

## 2021-03-15 MED ORDER — MISOPROSTOL 50MCG HALF TABLET
50.0000 ug | ORAL_TABLET | ORAL | Status: DC
  Administered 2021-03-15 (×2): 50 ug via BUCCAL
  Filled 2021-03-15 (×2): qty 1

## 2021-03-15 MED ORDER — DIPHENHYDRAMINE HCL 50 MG/ML IJ SOLN: 12.5000 mg | INTRAMUSCULAR | Status: DC | PRN

## 2021-03-15 MED ORDER — NIFEDIPINE ER OSMOTIC RELEASE 30 MG PO TB24: 30.0000 mg | ORAL_TABLET | Freq: Every day | ORAL | 11 refills | Status: DC

## 2021-03-15 MED ORDER — IBUPROFEN 800 MG PO TABS: 800.0000 mg | ORAL_TABLET | Freq: Three times a day (TID) | ORAL | 0 refills | Status: DC

## 2021-03-15 MED ORDER — PHENYLEPHRINE 40 MCG/ML (10ML) SYRINGE FOR IV PUSH (FOR BLOOD PRESSURE SUPPORT)
PREFILLED_SYRINGE | INTRAVENOUS | Status: AC
  Filled 2021-03-15: qty 10

## 2021-03-15 MED ORDER — TERBUTALINE SULFATE 1 MG/ML IJ SOLN: 0.2500 mg | Freq: Once | INTRAMUSCULAR | Status: DC | PRN

## 2021-03-15 MED ORDER — LORAZEPAM 0.5 MG PO TABS
1.0000 mg | ORAL_TABLET | Freq: Once | ORAL | Status: AC
  Administered 2021-03-15: 1 mg via ORAL
  Filled 2021-03-15: qty 2

## 2021-03-15 MED ORDER — MISOPROSTOL 50MCG HALF TABLET
ORAL_TABLET | ORAL | Status: AC
  Administered 2021-03-15: 50 ug via BUCCAL
  Filled 2021-03-15: qty 1

## 2021-03-15 MED ORDER — FENTANYL-BUPIVACAINE-NACL 0.5-0.125-0.9 MG/250ML-% EP SOLN
EPIDURAL | Status: AC
  Filled 2021-03-15: qty 250

## 2021-03-15 MED ORDER — LIDOCAINE HCL (PF) 1 % IJ SOLN
INTRAMUSCULAR | Status: DC | PRN
  Administered 2021-03-15: 10 mL via EPIDURAL

## 2021-03-15 NOTE — Progress Notes (Signed)
Labor Progress Note Jamie Gates is a 21 y.o. G1P0000 at [redacted]w[redacted]d presented for IOL for IUFD  S:  Patient comfortable with epidural  O:  BP 123/68    Pulse 71    Temp 98.6 F (37 C)    Resp 16    LMP 07/29/2020 (Exact Date)  EFM: n/a  CVE: 1.5/60/0   A&P: 21 y.o. G1P0000 [redacted]w[redacted]d IOL IUFD #Labor: Discussed with patient placement of foley balloon for cervical ripening. Risks and benefits reviewed. Patient agreeable to plan of care. Foley balloon inserted without difficulty and inflated with 60cc sterile water. Patient tolerated procedure well.   Continue cytotec.  Review of labs showed  Positive HSV-1 and HSV-2 titers: known to patient and also positive in 2021 CMV IgG positive- unknown hx of illness  #Pain: epidural  Wende Mott, CNM 9:39 AM

## 2021-03-15 NOTE — Anesthesia Procedure Notes (Signed)
Epidural Patient location during procedure: OB Start time: 03/15/2021 6:16 AM End time: 03/15/2021 6:20 AM  Staffing Anesthesiologist: Leilani Able, MD Performed: anesthesiologist   Preanesthetic Checklist Completed: patient identified, IV checked, site marked, risks and benefits discussed, surgical consent, monitors and equipment checked, pre-op evaluation and timeout performed  Epidural Patient position: sitting Prep: DuraPrep and site prepped and draped Patient monitoring: continuous pulse ox and blood pressure Approach: midline Location: L3-L4 Injection technique: LOR air  Needle:  Needle type: Tuohy  Needle gauge: 17 G Needle length: 9 cm and 9 Needle insertion depth: 7 cm Catheter type: closed end flexible Catheter size: 19 Gauge Catheter at skin depth: 12 cm Test dose: negative and Other  Assessment Events: blood not aspirated, injection not painful, no injection resistance, no paresthesia and negative IV test  Additional Notes Reason for block:procedure for pain

## 2021-03-15 NOTE — Anesthesia Preprocedure Evaluation (Signed)
Anesthesia Evaluation  Patient identified by MRN, date of birth, ID band Patient awake    Reviewed: Allergy & Precautions, H&P , Patient's Chart, lab work & pertinent test results  Airway Mallampati: III  TM Distance: >3 FB Neck ROM: full    Dental no notable dental hx.    Pulmonary former smoker,    Pulmonary exam normal        Cardiovascular Normal cardiovascular exam+ Valvular Problems/Murmurs MR      Neuro/Psych negative neurological ROS  negative psych ROS   GI/Hepatic negative GI ROS, Neg liver ROS,   Endo/Other  Morbid obesity  Renal/GU negative Renal ROS  negative genitourinary   Musculoskeletal negative musculoskeletal ROS (+)   Abdominal (+) + obese,   Peds  Hematology negative hematology ROS (+)   Anesthesia Other Findings   Reproductive/Obstetrics (+) Pregnancy                             Anesthesia Physical Anesthesia Plan  ASA: 3  Anesthesia Plan: Epidural   Post-op Pain Management:    Induction:   PONV Risk Score and Plan:   Airway Management Planned:   Additional Equipment:   Intra-op Plan:   Post-operative Plan:   Informed Consent: I have reviewed the patients History and Physical, chart, labs and discussed the procedure including the risks, benefits and alternatives for the proposed anesthesia with the patient or authorized representative who has indicated his/her understanding and acceptance.       Plan Discussed with:   Anesthesia Plan Comments:         Anesthesia Quick Evaluation

## 2021-03-15 NOTE — Discharge Summary (Signed)
Postpartum Discharge Summary ° °Patient Name: Jamie Gates °DOB: 11/24/2000 °MRN: [id number] ° °Date of admission: 03/14/2021 °Delivery date:03/15/2021  °Delivering provider: NEILL, CAROLINE M  °Date of discharge: 03/15/2021 ° °Admitting diagnosis: Indication for care in labor or delivery [O75.9] °Intrauterine pregnancy: [redacted]w[redacted]d     °Secondary diagnosis:  Principal Problem: °  Indication for care in labor or delivery ° °Additional problems: n/a    °Discharge diagnosis: Preterm Pregnancy Delivered and IUFD                                               °Post partum procedures: n/a °Augmentation: Cytotec and IP Foley °Complications: Stillborn ° °Hospital course: Induction of Labor With Vaginal Delivery   °21 y.o. yo G1P0000 at [redacted]w[redacted]d was admitted to the hospital 03/14/2021 for induction of labor.  Indication for induction:  IUFD .  Patient had an uncomplicated labor course as follows: °Membrane Rupture Time/Date: 1:58 PM ,03/15/2021   °Delivery Method:Vaginal, Spontaneous  °Episiotomy: None  °Lacerations:  None  °Details of delivery can be found in separate delivery note.  Patient had a routine postpartum course. Patient is discharged home 03/15/21. ° °Newborn Data: °Birth date:03/15/2021  °Birth time:2:06 PM  °Gender:Female  °Living status:Fetal Demise  °Apgars:0 ,0  °Weight:1335 g  ° °Magnesium Sulfate received: No °BMZ received: No °Rhophylac:No °MMR:N/A °T-DaP: n/a °Flu: Yes °Transfusion:No ° °Physical exam  °Vitals:  ° 03/15/21 1530 03/15/21 1705 03/15/21 1800 03/15/21 1900  °BP: (!) 138/50 (!) 118/59 (!) 145/68 133/80  °Pulse: 66 74 (!) 110   °Resp: 18 18 16 16  °Temp:  99.9 °F (37.7 °C) 100 °F (37.8 °C) 98.7 °F (37.1 °C)  °TempSrc:  Oral Oral Oral  ° °General: alert, cooperative, and no distress °Lochia: appropriate °Uterine Fundus: firm °Incision: N/A °DVT Evaluation: No evidence of DVT seen on physical exam. °Labs: °Lab Results  °Component Value Date  ° WBC 9.9 03/14/2021  ° HGB 13.1 03/14/2021  ° HCT 38.1  03/14/2021  ° MCV 89.4 03/14/2021  ° PLT 200 03/14/2021  ° °CMP Latest Ref Rng & Units 03/14/2021  °Glucose 70 - 99 mg/dL 80  °BUN 6 - 20 mg/dL 10  °Creatinine 0.44 - 1.00 mg/dL 0.65  °Sodium 135 - 145 mmol/L 136  °Potassium 3.5 - 5.1 mmol/L 3.8  °Chloride 98 - 111 mmol/L 106  °CO2 22 - 32 mmol/L 22  °Calcium 8.9 - 10.3 mg/dL 8.8(L)  °Total Protein 6.5 - 8.1 g/dL 6.8  °Total Bilirubin 0.3 - 1.2 mg/dL 0.3  °Alkaline Phos 38 - 126 U/L 180(H)  °AST 15 - 41 U/L 15  °ALT 0 - 44 U/L 13  ° °Edinburgh Score: °No flowsheet data found. ° ° °After visit meds:  °Allergies as of 03/15/2021   °No Known Allergies °  ° °  °Medication List  °  ° °STOP taking these medications   ° °valACYclovir 500 MG tablet °Commonly known as: Valtrex °  ° °  ° °TAKE these medications   ° °aspirin EC 81 MG tablet °Take 81 mg by mouth in the morning. Swallow whole. °  °famotidine 40 MG tablet °Commonly known as: Pepcid °Take 1 tablet (40 mg total) by mouth daily. °What changed:  °when to take this °reasons to take this °  °ibuprofen 800 MG tablet °Commonly known as: ADVIL °Take 1 tablet (800 mg total) by mouth 3 (three) times   daily. °  °metoprolol succinate 25 MG 24 hr tablet °Commonly known as: TOPROL-XL °Take 0.5 tablets (12.5 mg total) by mouth daily. °  °Prenatal Vitamin Plus Low Iron 27-1 MG Tabs °Take 1 mg by mouth daily. °  °promethazine 12.5 MG tablet °Commonly known as: PHENERGAN °Take 12.5 mg by mouth 3 (three) times daily as needed for nausea/vomiting. °  ° °  ° ° ° °Discharge home in stable condition °Infant Feeding:  n/a °Infant Disposition:morgue °Discharge instruction: per After Visit Summary and Postpartum booklet. °Activity: Advance as tolerated. Pelvic rest for 6 weeks.  °Diet: routine diet °Future Appointments: °Future Appointments  °Date Time Provider Department Center  °03/24/2021  1:35 PM Anyanwu, Ugonna A, MD [facility]  °04/08/2021 10:40 AM Tobb, Kardie, DO [facility]  ° °Follow up Visit: ° Follow-up Information   ° °  [facility] Follow up.   °Specialty: Obstetrics and Gynecology °Why: For a postpartum °Contact information: °[address] °[place] [address] °[phone] ° °  °  ° °  °  ° °  ° ° ° °Please schedule this patient for a In person postpartum visit in 4 weeks with the following provider: Any provider. °Additional Postpartum F/U:Postpartum Depression checkup  °High risk pregnancy complicated by:  IUFD °Delivery mode:  Vaginal, Spontaneous  °Anticipated Birth Control:  Unsure ° ° °03/15/2021 °Caroline M Neill, CNM ° ° ° °

## 2021-03-15 NOTE — Progress Notes (Signed)
°   03/15/21 1740  Clinical Encounter Type  Visited With Patient and family together;Health care provider  Visit Type Initial;Psychological support  Referral From Nurse  Spiritual Encounters  Spiritual Needs Emotional;Grief support   Chaplain met with patient, family member, and church support after fetal demise. Chaplain introduced spiritual care services. Chaplain offered support and introduced services. Spiritual care services available as needed.   Jeri Lager, Chaplain

## 2021-03-15 NOTE — Anesthesia Postprocedure Evaluation (Signed)
Anesthesia Post Note  Patient: Jamie Gates  Procedure(s) Performed: AN AD HOC LABOR EPIDURAL     Patient location during evaluation: Mother Baby Anesthesia Type: Epidural Level of consciousness: awake Pain management: satisfactory to patient Vital Signs Assessment: post-procedure vital signs reviewed and stable Respiratory status: spontaneous breathing Cardiovascular status: stable Anesthetic complications: no  Report via Lajuana Ripple RN No notable events documented.  Last Vitals:  Vitals:   03/15/21 1330 03/15/21 1430  BP: (!) 127/57 (!) 122/55  Pulse: 70 84  Resp: 18   Temp:      Last Pain:  Vitals:   03/15/21 1330  TempSrc:   PainSc: 0-No pain   Pain Goal:                   [employer]

## 2021-03-15 NOTE — Progress Notes (Signed)
Labor Progress Note   S: Doing well, just slept for a little bit. Feeling cramping.   O: Blood pressure (!) 108/53, pulse 69, temperature 98.1 F (36.7 C), temperature source Axillary, resp. rate 14, last menstrual period 07/29/2020.   Dilation: 1.5 Effacement (%): 60 Cervical Position: Middle Station: -2 Presentation: Vertex Exam by:: Leticia Penna   A/p: Labor: Cervix largely unchanged. Will give 5th cytotec. She is interested in a FB to help progress, however requests her epidural first. Plan for this and then FB placement.   Allayne Stack, DO

## 2021-03-16 ENCOUNTER — Telehealth: Payer: Self-pay

## 2021-03-16 NOTE — Telephone Encounter (Signed)
Transition Care Management Unsuccessful Follow-up Telephone Call  Date of discharge and from where:  03/15/2021-[facility]   Attempts:  1st Attempt  Reason for unsuccessful TCM follow-up call:  Left voice message

## 2021-03-17 LAB — TYPE AND SCREEN
ABO/RH(D): B NEG
Antibody Screen: POSITIVE
Unit division: 0
Unit division: 0

## 2021-03-17 LAB — BPAM RBC
Blood Product Expiration Date: 202302032359
Blood Product Expiration Date: 202302032359
Unit Type and Rh: 1700
Unit Type and Rh: 1700

## 2021-03-17 LAB — HSV-2 IGG SUPPLEMENTAL TEST: HSV-2 IgG Supplemental Test: POSITIVE — AB

## 2021-03-17 LAB — HSV 2 ANTIBODY, IGG: HSV 2 Glycoprotein G Ab, IgG: 4.91 index — ABNORMAL HIGH (ref 0.00–0.90)

## 2021-03-17 NOTE — Telephone Encounter (Signed)
Transition Care Management Unsuccessful Follow-up Telephone Call  Date of discharge and from where:  03/15/2021 from [facility]  Attempts:  2nd Attempt  Reason for unsuccessful TCM follow-up call:  Left voice message

## 2021-03-18 LAB — SURGICAL PATHOLOGY

## 2021-03-18 NOTE — Telephone Encounter (Addendum)
Transition Care Management Follow-up Telephone Call Date of discharge and from where: 03/15/2021 from [facility] How have you been since you were released from the hospital? Pt stated that she is feeling well and did not have any questions or concerns at this time.  Any questions or concerns? No  Items Reviewed: Did the pt receive and understand the discharge instructions provided? Yes  Medications obtained and verified? Yes  Other? No  Any new allergies since your discharge? No  Dietary orders reviewed? No Do you have support at home? Yes   Functional Questionnaire: (I = Independent and D = Dependent) ADLs: I  Bathing/Dressing- I  Meal Prep- I  Eating- I  Maintaining continence- I  Transferring/Ambulation- I  Managing Meds- I  Follow up appointments reviewed:  PCP Hospital f/u appt confirmed? No   Specialist Hospital f/u appt confirmed? No   Are transportation arrangements needed? No  If their condition worsens, is the pt aware to call PCP or go to the Emergency Dept.? Yes Was the patient provided with contact information for the PCP's office or ED? Yes Was to pt encouraged to call back with questions or concerns? Yes

## 2021-03-19 ENCOUNTER — Telehealth: Payer: Self-pay

## 2021-03-19 NOTE — Telephone Encounter (Addendum)
-----   Message from Milas Hock, MD sent at 03/19/2021  4:23 PM EST ----- Pt had an IUFD. She wanted to leave before rhogam studies came back. They did come back as that she needs rhogam. Can you please have her come in for her postpartum rhogam (full dose).   Thank you, pad  Called pt and informed pt that she needs a Rhogram shot based upon her labs if she could come in this week.  Pt stated that she will be able to come in 03/30/21 @ 115.  Front office notified to place on nurse visit schedule.    Leonette Nutting  03/20/21

## 2021-03-20 ENCOUNTER — Other Ambulatory Visit: Payer: Self-pay

## 2021-03-20 ENCOUNTER — Ambulatory Visit (INDEPENDENT_AMBULATORY_CARE_PROVIDER_SITE_OTHER): Payer: Medicaid Other

## 2021-03-20 VITALS — BP 120/79 | HR 89 | Wt 298.7 lb

## 2021-03-20 DIAGNOSIS — Z6791 Unspecified blood type, Rh negative: Secondary | ICD-10-CM

## 2021-03-20 DIAGNOSIS — F32A Depression, unspecified: Secondary | ICD-10-CM

## 2021-03-20 MED ORDER — RHO D IMMUNE GLOBULIN 1500 UNIT/2ML IJ SOSY
300.0000 ug | PREFILLED_SYRINGE | Freq: Once | INTRAMUSCULAR | Status: AC
  Administered 2021-03-20: 300 ug via INTRAMUSCULAR

## 2021-03-20 NOTE — Progress Notes (Signed)
Pt here today for rhogam injection per request of Dr.Duncan.  Pt tolerated well.  Pt begins to cry and states "I lost my baby".  Pt endorses that she has a support system at home.  Pt agrees to seeing [place], [facility].  Amb ref placed for [facility].  Pt informed that Asher Muir will call to schedule her an appt.  Pt verbalized understanding with on further questions.   Leonette Nutting  03/20/21

## 2021-03-21 ENCOUNTER — Ambulatory Visit: Payer: Medicaid Other

## 2021-03-24 ENCOUNTER — Ambulatory Visit (INDEPENDENT_AMBULATORY_CARE_PROVIDER_SITE_OTHER): Payer: Medicaid Other | Admitting: Obstetrics & Gynecology

## 2021-03-24 ENCOUNTER — Encounter: Payer: Self-pay | Admitting: Obstetrics & Gynecology

## 2021-03-24 ENCOUNTER — Other Ambulatory Visit: Payer: Self-pay

## 2021-03-24 VITALS — BP 101/65 | HR 69 | Wt 293.3 lb

## 2021-03-24 DIAGNOSIS — O364XX0 Maternal care for intrauterine death, not applicable or unspecified: Secondary | ICD-10-CM | POA: Diagnosis not present

## 2021-03-24 DIAGNOSIS — Z634 Disappearance and death of family member: Secondary | ICD-10-CM

## 2021-03-24 DIAGNOSIS — I429 Cardiomyopathy, unspecified: Secondary | ICD-10-CM | POA: Diagnosis not present

## 2021-03-24 NOTE — Progress Notes (Signed)
OBSTETRICS POSTPARTUM OFFICE VISIT NOTE  History:   Jamie Gates is a 21 y.o. G1P0100 here today for follow up after vaginal delivery of intrauterine fetal demise (IUFD) at 32 weeks on 03/15/21.  She is here with FOB. Both are appropriately grieving and sad:   Edinburgh Postnatal Depression Scale - 03/24/21 1334       Edinburgh Postnatal Depression Scale:  In the Past 7 Days   I have been able to laugh and see the funny side of things. 0    I have looked forward with enjoyment to things. 3    I have blamed myself unnecessarily when things went wrong. 0    I have been anxious or worried for no good reason. 3    I have felt scared or panicky for no good reason. 3    Things have been getting on top of me. 3    I have been so unhappy that I have had difficulty sleeping. 0    I have felt sad or miserable. 3    I have been so unhappy that I have been crying. 3    The thought of harming myself has occurred to me. 0    Edinburgh Postnatal Depression Scale Total 18           She agreed to see our [facility] clinician. No SI/HI. Feels like she has enough support at home.  Has occasional mild cramping, no current bleeding.  She denies any abnormal vaginal discharge or other concerns.    Past Medical History:  Diagnosis Date   Allergy    Cardiomegaly 12/18/2012   still working with cardiology-esp now with pregnancy   Chlamydia 09/23/2018   Treated 09/23/2018   Gonorrhea 09/23/2018   Treated 09/23/2018   Obesity    Ovarian cyst     Past Surgical History:  Procedure Laterality Date   TYMPANOSTOMY TUBE PLACEMENT      The following portions of the patient's history were reviewed and updated as appropriate: allergies, current medications, past family history, past medical history, past social history, past surgical history and problem list.    Review of Systems:  Pertinent items noted in HPI and remainder of comprehensive ROS otherwise negative.  Physical Exam:  BP 101/65    Pulse 69     Wt 293 lb 4.8 oz (133 kg)    LMP 07/29/2020 (Exact Date)    Breastfeeding No    BMI 39.78 kg/m  CONSTITUTIONAL: Well-developed, well-nourished female in no acute distress.  HEENT:  Normocephalic, atraumatic. External right and left ear normal. No scleral icterus.  NECK: Normal range of motion, supple, no masses noted on observation SKIN: No rash noted. Not diaphoretic. No erythema. No pallor. MUSCULOSKELETAL: Normal range of motion. No edema noted. NEUROLOGIC: Alert and oriented to person, place, and time. Normal muscle tone coordination. No cranial nerve deficit noted. PSYCHIATRIC: Depressed mood and affect. Normal behavior. Normal judgment and thought content. CARDIOVASCULAR: Normal heart rate noted RESPIRATORY: Effort and breath sounds normal, no problems with respiration noted ABDOMEN: No masses noted. No other overt distention noted.   PELVIC: Deferred     Assessment and Plan:     1. Loss of infant 2. Fetal demise, greater than 22 weeks, antepartum, single or unspecified fetus Patient verbally consented to [facility] services about presenting concerns and psychiatric consultation as appropriate. Appointment to be made soon. Support given to her and her FOB. Addressed all postpartum concerns. Will discuss contraception etc at next visit. - Ambulatory  referral to Integrated Behavioral Health  3. Cardiomyopathy, unspecified type (HCC) Follow up Dr. Lavona Mound Tobb as scheduled.  Routine preventative health maintenance measures emphasized. Please refer to After Visit Summary for other counseling recommendations.   Return in about 3 weeks (around 04/14/2021) for Postpartum check (s/p delivery of IUFD at 32 weeks).     Jaynie Collins, MD, FACOG Obstetrician & Gynecologist, [facility] for [employer], [facility]

## 2021-03-25 NOTE — BH Specialist Note (Signed)
Pt did not arrive to video visit and did not answer the phone; Left HIPPA-compliant message to call back Alanni Vader from [facility] at  [phone] (Jamina Macbeth's office).  ?; left MyChart message for patient.  ? ?

## 2021-04-08 ENCOUNTER — Ambulatory Visit: Payer: Medicaid Other | Admitting: Clinical

## 2021-04-08 ENCOUNTER — Ambulatory Visit: Payer: Medicaid Other | Admitting: Cardiology

## 2021-04-08 ENCOUNTER — Other Ambulatory Visit: Payer: Self-pay

## 2021-04-08 DIAGNOSIS — Z91199 Patient's noncompliance with other medical treatment and regimen due to unspecified reason: Secondary | ICD-10-CM

## 2021-04-14 ENCOUNTER — Ambulatory Visit: Payer: Medicaid Other | Admitting: Obstetrics and Gynecology

## 2021-08-05 ENCOUNTER — Encounter: Payer: Self-pay | Admitting: *Deleted

## 2021-08-13 ENCOUNTER — Ambulatory Visit: Payer: Medicaid Other | Admitting: Family Medicine

## 2021-09-22 ENCOUNTER — Ambulatory Visit: Payer: Medicaid Other | Admitting: Student

## 2021-11-05 ENCOUNTER — Other Ambulatory Visit: Payer: Self-pay

## 2021-11-05 ENCOUNTER — Encounter: Payer: Self-pay | Admitting: Student

## 2021-11-05 ENCOUNTER — Ambulatory Visit (INDEPENDENT_AMBULATORY_CARE_PROVIDER_SITE_OTHER): Payer: Medicaid Other | Admitting: Student

## 2021-11-05 VITALS — BP 104/73 | HR 71 | Wt 300.2 lb

## 2021-11-05 DIAGNOSIS — O09891 Supervision of other high risk pregnancies, first trimester: Secondary | ICD-10-CM

## 2021-11-05 DIAGNOSIS — O0990 Supervision of high risk pregnancy, unspecified, unspecified trimester: Secondary | ICD-10-CM | POA: Insufficient documentation

## 2021-11-05 DIAGNOSIS — N912 Amenorrhea, unspecified: Secondary | ICD-10-CM

## 2021-11-05 DIAGNOSIS — O0991 Supervision of high risk pregnancy, unspecified, first trimester: Secondary | ICD-10-CM | POA: Diagnosis not present

## 2021-11-05 LAB — POCT URINE PREGNANCY: Preg Test, Ur: POSITIVE — AB

## 2021-11-05 MED ORDER — PRENATAL 27-1 MG PO TABS: 1.0000 | ORAL_TABLET | Freq: Every day | ORAL | 10 refills | Status: DC

## 2021-11-05 NOTE — Progress Notes (Incomplete)
    SUBJECTIVE:   CHIEF COMPLAINT / HPI:   Positive home pregnancy test Pt had positive home pregnancy test on 8/3. She took it because she missed her period. She does have regular monthly periods. She thinks the first day of her last period was 09/04/21 however this may not be exact. Today she states she feels tired. She is nauseous but has not had any vomiting. She denies any vaginal bleeding. Does feel some mild cramping. She was pregnant last year but had IUFD at 32 weeks this past January.  She is feeling overwhelmed but happy about the pregnancy. 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  Last outbreak was over a year ago and she does not take valacyclovir daily for prophylaxis.  Hx of heart problems During her last pregnancy, she had mitral regurgitation of pregnancy, cardiomyopathy and heart failure with EF of  40-45%. She followed with Dr. Servando Salina. She was instructed to start Toprol-XL 12.5 mg daily and to follow up in 12 weeks. Plan was to repeat echocardiogram postpartum and likely cardiac MRI. Pt did not see Dr. Servando Salina again after the IUFD and did not continue to take the Toprol. She denies and SOB or edema today. She states the fatigue she has been feeling came along with this new pregnancy.   PERTINENT  PMH / PSH: History of mitral regurgitation and cardiomyopathy in pregnancy, HSV 1 and 2  OBJECTIVE:   Vitals:   11/05/21 0836  BP: 104/73  Pulse: 71  SpO2: 100%     General: NAD, pleasant, able to participate in exam Cardiac: RRR, no murmurs. Respiratory: CTAB, normal effort, No wheezes, rales or rhonchi Abdomen:nontender, nondistended, soft Extremities: no edema or cyanosis. Skin: warm and dry, no rashes noted Neuro: alert, no obvious focal deficits Psych: Normal affect and mood  ASSESSMENT/PLAN:   High-risk pregnancy As patient had mitral regurgitation of pregnancy, cardiomyopathy, heart failure, and lost her last pregnancy at 32 weeks while following with high risk OB, we will go  ahead and refer patient to high risk OB now.  I have ordered future first OB labs for patient if she desires to come back and have those done prior to her next OB appointment.  Advised patient to go ahead and schedule her first OB appointment with [place] in 2 weeks as I am not sure how long will take her to get in with high risk. D/t cardiac history during last pregnancy, I have advised pt to follow up with Dr. Servando Salina ASAP. Depending on when she is able to get an appointment, I may go ahead and order an echocardiogram.    -High risk OB referral -Pt to schedule apt with her cardiologist ASAP, will let me know when she can get an apt.  -Prescription for prenatal vitamins sent to pharmacy -Patient will need to start aspirin at 12 weeks -Will need to start valacyclovir later in pregnancy as she has history of genital herpes   Dr. Erick Alley, DO [facility]    {    This will disappear when note is signed, click to select method of visit    :1}

## 2021-11-05 NOTE — Progress Notes (Signed)
    SUBJECTIVE:   CHIEF COMPLAINT / HPI:   Positive home pregnancy test Pt had positive home pregnancy test on 8/3. She took it because she missed her period. She does have regular monthly periods. She thinks the first day of her last period was 09/04/21 however this may not be exact. Today she states she feels tired. She is nauseous but has not had any vomiting. She denies any vaginal bleeding. Does feel some mild cramping. She was pregnant last year but had IUFD at 32 weeks this past January.  She is feeling overwhelmed but happy about the pregnancy. 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  Last outbreak was over a year ago and she does not take valacyclovir daily for prophylaxis.  Hx of heart problems During her last pregnancy, she had mitral regurgitation of pregnancy, cardiomyopathy and heart failure with EF of  40-45%. She followed with Dr. Servando Salina. She was instructed to start Toprol-XL 12.5 mg daily and to follow up in 12 weeks. Plan was to repeat echocardiogram postpartum and likely cardiac MRI. Pt did not see Dr. Servando Salina again after the IUFD and did not continue to take the Toprol. She denies and SOB or edema today. She states the fatigue she has been feeling came along with this new pregnancy.   PERTINENT  PMH / PSH: History of mitral regurgitation and cardiomyopathy in pregnancy, HSV 1 and 2  OBJECTIVE:   Vitals:   11/05/21 0836  BP: 104/73  Pulse: 71  SpO2: 100%   General: NAD, pleasant, able to participate in exam Cardiac: RRR, no murmurs. Respiratory: CTAB, normal effort, No wheezes, rales or rhonchi Abdomen:nontender, nondistended, soft Extremities: no edema or cyanosis. Skin: warm and dry, no rashes noted Neuro: alert, no obvious focal deficits Psych: Normal affect and mood  ASSESSMENT/PLAN:   High-risk pregnancy As patient had mitral regurgitation of pregnancy, cardiomyopathy, heart failure, and lost her last pregnancy at 32 weeks while following with high risk OB, we will go ahead  and refer patient to high risk OB now.  I have ordered future first OB labs for patient if she desires to come back and have those done prior to her next OB appointment.  Advised patient to go ahead and schedule her first OB appointment with [place] in 2 weeks as I am not sure how long will take her to get in with high risk.     -High risk OB referral -Prescription for prenatal vitamins sent to pharmacy -Patient will need to start aspirin at 12 weeks -Will need to start valacyclovir later in pregnancy as she has history of genital herpes  History of maternal cardiomyopathy, currently pregnant in first trimester D/t cardiac history during last pregnancy, I have advised pt to follow up with Dr. Servando Salina ASAP. Depending on when she is able to get an appointment, I may go ahead and order an echocardiogram. Today she is well appearing, denies cardiac symptoms of SOB or fluid over load and appear euvolemic on exam and no cardiac murmur heard. -Pt to schedule apt with cardiologist ASAP, will let me know when she can get an apt or if she needs help getting it scheduled  Dr. Erick Alley, DO  [facility]

## 2021-11-05 NOTE — Patient Instructions (Addendum)
It was great to see you! Thank you for allowing me to participate in your care!  Our plans for today:  - I will order your OB labs. You can come in for a lab only visit to have these done later this week or next week if you would like - Please schedule your first OB appointment in the near future, about 2 weeks.  -I am referring you to high risk OB. You will be contacted to schedule an appointment -Your prenatal vitamins have been sent to your pharmacy -Return or go to the MAU at the hospital if you have and vaginal bleeding or intense abdominal pain    Take care and seek immediate care sooner if you develop any concerns.   Dr. Erick Alley, DO [facility]

## 2021-11-05 NOTE — Assessment & Plan Note (Addendum)
As patient had mitral regurgitation of pregnancy, cardiomyopathy, heart failure, and lost her last pregnancy at 32 weeks while following with high risk OB, we will go ahead and refer patient to high risk OB now.  I have ordered future first OB labs for patient if she desires to come back and have those done prior to her next OB appointment.  Advised patient to go ahead and schedule her first OB appointment with [place] in 2 weeks as I am not sure how long will take her to get in with high risk.     -High risk OB referral -Prescription for prenatal vitamins sent to pharmacy -Patient will need to start aspirin at 12 weeks -Will need to start valacyclovir later in pregnancy as she has history of genital herpes

## 2021-11-06 ENCOUNTER — Other Ambulatory Visit: Payer: Self-pay | Admitting: Student

## 2021-11-06 ENCOUNTER — Encounter: Payer: Self-pay | Admitting: Student

## 2021-11-06 DIAGNOSIS — O09891 Supervision of other high risk pregnancies, first trimester: Secondary | ICD-10-CM | POA: Insufficient documentation

## 2021-11-06 DIAGNOSIS — O09899 Supervision of other high risk pregnancies, unspecified trimester: Secondary | ICD-10-CM | POA: Insufficient documentation

## 2021-11-06 NOTE — Progress Notes (Signed)
Echocardiogram ordered d/t history of cardiomyopathy and heart failure with reduced ejection fraction during previous pregnancy.

## 2021-11-06 NOTE — Assessment & Plan Note (Addendum)
D/t cardiac history during last pregnancy, I have advised pt to follow up with Dr. Servando Salina ASAP. Depending on when she is able to get an appointment, I may go ahead and order an echocardiogram. Today she is well appearing, denies cardiac symptoms of SOB or fluid over load and appear euvolemic on exam and no cardiac murmur heard. -Pt to schedule apt with cardiologist ASAP, will let me know when she can get an apt or if she needs help getting it scheduled

## 2021-11-11 ENCOUNTER — Encounter: Payer: Self-pay | Admitting: Student

## 2021-11-11 ENCOUNTER — Other Ambulatory Visit: Payer: Self-pay | Admitting: Student

## 2021-11-12 ENCOUNTER — Other Ambulatory Visit: Payer: Self-pay

## 2021-11-12 MED ORDER — VALACYCLOVIR HCL 1 G PO TABS
1000.0000 mg | ORAL_TABLET | Freq: Every day | ORAL | 0 refills | Status: AC
Start: 2021-11-12 — End: 2021-11-17
  Filled 2021-11-12: qty 5, 5d supply, fill #0

## 2021-11-16 NOTE — Progress Notes (Deleted)
Patient Name: Jamie Gates Date of Birth: [birth year] [place] Initial Prenatal Visit  Jamie Gates is a 21 y.o. year old G1P0100 at Unknown who presents for her initial prenatal visit. Pregnancy {Is/is not:9024} planned She reports {pregnancy symptoms:18128}. She {is/is not:320031::"is"} taking a prenatal vitamin.  She denies pelvic pain or vaginal bleeding.   Pregnancy Dating: The patient is dated by ***.  LMP: *** Period is certain:  {ZOX/WR:60454}.  Periods were regular:  {yes/no:20286}.  LMP was a typical period:  {yes/no:20286}.  Using hormonal contraception in 3 months prior to conception: {yes/no:20286}  Lab Review: Of note, OB labs were not collected prior to this visit.  Blood type: B NEG Rh Status: - Antibody screen: Positive in the past for anti-Lewis HIV: Not done yet RPR: Not done yet this pregnancy.  Hemoglobin electrophoresis reviewed: Yes, reviewed from prior pregnancy and normal.  Results of OB urine culture are: Not done yet Rubella: Immune in previous pregnancy Hep C Ab: Not done Varicella status is {Desc; immune/not/unknown:31571::"Immune"}  PMH: Reviewed and as detailed below: HTN: {yes/no:20286::"No"}  Gestational Hypertension/preeclampsia: {yes/no:20286::"No"}  Type 1 or 2 Diabetes: {yes/no:20286::"No"}  Depression:  {yes/no:20286::"No"}  Seizure disorder:  {yes/no:20286::"No"} VTE: {yes/no:20286::"No"} ,  History of STI {yes/no:20286::"No"},  Abnormal Pap smear:  {yes/no:20286::"No"}, Genital herpes simplex:  Yes *** Hx of Heart Failure: Echo from 10/2020 showed EF 40 to 45%, LV with global hypokinesis.. Repeat Echo January 2023 showed EF 50 to 55%, moderate to severe mitral valve regurgitation, small PFO.  She was followed by cardio obstetrics during her last pregnancy and OB/GYN given her high risk pregnancy.  PSH: Gynecologic Surgery:  {No/  **:31982:o:"no"} Surgical history reviewed, notable for: ***  Obstetric  History: Obstetric history tab updated and reviewed.  Summary of prior pregnancies: *** Cesarean delivery: {yes/no:20286::"No"}  Gestational Diabetes:  {yes/no:20286::"No"} Hypertension in pregnancy: {yes/no:20286::"No"} History of preterm birth: {yes/no:20286::"No"} History of LGA/SGA infant:  {yes/no:20286::"No"} History of shoulder dystocia: {yes/no:20286::"No"} Indications for referral were reviewed, and the patient has no obstetric indications for referral to [facility] at this time.   Social History: Partner's name: ***  Tobacco use: {yes/no:20286::"No"} Alcohol use:  {yes/no:20286::"No"} Other substance use:  {yes/no:20286::"No"}  Current Medications:  ***  Reviewed and appropriate in pregnancy.   Genetic and Infection Screen: Flow Sheet Updated {yes/no:20286::"Yes"}  Prenatal Exam: Gen: Well nourished, well developed.  No distress.  Vitals noted. HEENT: Normocephalic, atraumatic.  Neck supple without cervical lymphadenopathy, thyromegaly or thyroid nodules.  Fair dentition. CV: RRR no murmur, gallops or rubs Lungs: CTA B.  Normal respiratory effort without wheezes or rales. Abd: soft, NTND. +BS.  Uterus not appreciated above pelvis. GU: Normal external female genitalia without lesions.  Nl vaginal, well rugated without lesions. No vaginal discharge.  Bimanual exam: No adnexal mass or TTP. No CMT.  Uterus size *** Ext: No clubbing, cyanosis or edema. Psych: Normal grooming and dress.  Not depressed or anxious appearing.  Normal thought content and process without flight of ideas or looseness of associations  Fetal heart tones: {appropriate:23337::"Appropriate"}  Assessment/Plan:  Jamie Gates is a 21 y.o. G1P0100 at Unknown who presents to initiate prenatal care. She is doing well.  Current pregnancy issues include ***.  Routine prenatal care: As dating {ACTION; IS/IS NOT:21021397::"is not"} reliable, a dating ultrasound {HAS HAS NOT:18834::"has"} been  ordered. Dating tab updated. Pre-pregnancy weight updated. Expected weight gain this pregnancy is 11-20 pounds  Prenatal labs reviewed, notable for ***. Indications for referral to [facility] were reviewed and the  patient {DOES NOT does:27190::"does not"} meet criteria for referral.  Medication list reviewed and updated.  Recommended patient see a dentist for regular care.  Bleeding and pain precautions reviewed. Importance of prenatal vitamins reviewed.  Genetic screening offered. Patient opted for: {obgeneticscreen:23414}. The patient has the following indications for aspirinto begin 81 mg at 12-16 weeks: One high risk condition: no single high risk condition  MORE than one moderate risk condition: obesity, identifies as African American , and prior adverse pregnancy outcome  Aspirin was  recommended to start at 12 weeks. The patient will not be age 72 or over at time of delivery. Referral to genetic counseling was not offered today.  The patient has the following risk factors for preexisting diabetes: BMI > 25 and high risk ethnicity (Latino, [place] American, Native American, [place], Asian Optometrist) . An early 1 hour glucose tolerance test was ordered. Pregnancy Medical Home and PHQ-9 forms completed, problems noted: {yes/no:20286}  2. 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  This seemed to resolve with her echo in January of this year, however most recent echo showed moderate to severe mitral regurgitation.  She is followed by cardiac structure count clinic in her last pregnancy. History of IUGR in her previous pregnancy BMI ***: discussed expected weight gain this pregnancy  Referral to high-risk OB placed 9/6 Repeat Echo ordered 9/7; will ensure this is scheduled Initial OB labs collected today Pap smear today 1-hour glucola today  Pre-E ppx with 81 mg ASA starting at 12  weeks   Follow up 4 weeks for next prenatal visit.

## 2021-11-18 ENCOUNTER — Encounter: Payer: Medicaid Other | Admitting: Family Medicine

## 2021-11-25 ENCOUNTER — Encounter: Payer: Self-pay | Admitting: Student

## 2021-11-25 ENCOUNTER — Ambulatory Visit (HOSPITAL_COMMUNITY)
Admission: RE | Admit: 2021-11-25 | Discharge: 2021-11-25 | Disposition: A | Discharge status: Home / Self Care | Payer: Medicaid Other | Source: Ambulatory Visit | Attending: Family Medicine | Admitting: Family Medicine

## 2021-11-25 DIAGNOSIS — O09891 Supervision of other high risk pregnancies, first trimester: Secondary | ICD-10-CM

## 2021-11-25 DIAGNOSIS — I428 Other cardiomyopathies: Secondary | ICD-10-CM

## 2021-11-25 DIAGNOSIS — I340 Nonrheumatic mitral (valve) insufficiency: Secondary | ICD-10-CM | POA: Insufficient documentation

## 2021-11-25 DIAGNOSIS — Z3A01 Less than 8 weeks gestation of pregnancy: Secondary | ICD-10-CM | POA: Diagnosis not present

## 2021-11-25 DIAGNOSIS — I429 Cardiomyopathy, unspecified: Secondary | ICD-10-CM | POA: Insufficient documentation

## 2021-11-25 LAB — ECHOCARDIOGRAM COMPLETE
Area-P 1/2: 4.21 cm2
Calc EF: 39.6 %
MV M vel: 5.04 m/s
MV Peak grad: 101.6 mmHg
MV VTI: 2.52 cm2
Radius: 0.4 cm
S' Lateral: 4.9 cm
Single Plane A2C EF: 41.5 %
Single Plane A4C EF: 37.4 %

## 2021-11-25 NOTE — Progress Notes (Signed)
Echocardiogram 2D Echocardiogram has been performed.  Fidel Levy 11/25/2021, 11:52 AM

## 2021-11-26 ENCOUNTER — Other Ambulatory Visit: Payer: Self-pay | Admitting: Student

## 2021-11-26 DIAGNOSIS — O09891 Supervision of other high risk pregnancies, first trimester: Secondary | ICD-10-CM

## 2021-11-28 ENCOUNTER — Other Ambulatory Visit: Payer: Self-pay

## 2021-12-03 ENCOUNTER — Encounter: Payer: Medicaid Other | Admitting: Family Medicine

## 2021-12-10 ENCOUNTER — Telehealth (INDEPENDENT_AMBULATORY_CARE_PROVIDER_SITE_OTHER): Payer: Medicaid Other

## 2021-12-10 DIAGNOSIS — O0990 Supervision of high risk pregnancy, unspecified, unspecified trimester: Secondary | ICD-10-CM | POA: Insufficient documentation

## 2021-12-10 DIAGNOSIS — Z3689 Encounter for other specified antenatal screening: Secondary | ICD-10-CM

## 2021-12-10 NOTE — Progress Notes (Signed)
New OB Intake  I connected with  Jamie Gates on 12/10/21 at 11:15 AM EDT by MyChart Video Visit and verified that I am speaking with the correct person using two identifiers. Nurse is located at [facility] and pt is located at home.  I discussed the limitations, risks, security and privacy concerns of performing an evaluation and management service by telephone and the availability of in person appointments. I also discussed with the patient that there may be a patient responsible charge related to this service. The patient expressed understanding and agreed to proceed.  I explained I am completing New OB Intake today. We discussed her EDD of 06/11/2022 that is based on LMP of 09/04/2021 . Pt is G2/P0. I reviewed her allergies, medications, Medical/Surgical/OB history, and appropriate screenings. I informed her of [facility] services. [facility] information placed in AVS. 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 Patient stated she will give them call back to schedule an appointment. -Hx of IUFD at 84 weeks.   Delivery Plans Plans to deliver at [facility]. Patient given information for [facility] Healthy Baby website for more information about Women's and [place].  Patient is not interested in water birth. Offered upcoming OB visit with CNM to discuss further.  MyChart/Babyscripts MyChart access verified. I explained pt will have some visits in office and some virtually. Babyscripts instructions given and order placed. Patient verifies receipt of registration text/e-mail. Account successfully created and app downloaded.  Blood Pressure Cuff/Weight Scale  Anatomy US Explained first scheduled [place] will be around 19 weeks. Anatomy US scheduled for 01/12/2022 at 07:45 AM. Pt notified to arrive at 7:30 a,.  Labs Discussed Johnsie Cancel genetic screening with patient. Would like both Panorama and Horizon drawn at new OB visit. Routine prenatal labs needed.  Covid Vaccine Patient has not covid vaccine.   Is patient a CenteringPregnancy candidate?  Not a Candidate Declined due to Schedule/Times  Is patient a Mom+Baby Combined Care candidate?  Declined     Social Determinants of Health Food Insecurity: Patient denies food insecurity. WIC Referral: Patient is interested in referral to [facility].  Transportation: Patient denies transportation needs. Childcare: Discussed no children allowed at ultrasound appointments. Offered childcare services; patient declines childcare services at this time.  First visit review I reviewed new OB appt with pt. I explained she will have a provider visit that includes initial ob labs, genetic screening, pap smear, and pelvic exam. Explained pt will be seen by Elvera Maria CNM at first visit; encounter routed to appropriate provider. Explained that patient will be seen by pregnancy navigator following visit with provider.   Mariane Baumgarten, [place] 12/10/2021  11:40 AM

## 2021-12-22 ENCOUNTER — Encounter: Payer: Medicaid Other | Admitting: Family Medicine

## 2021-12-26 ENCOUNTER — Encounter: Payer: Self-pay | Admitting: Cardiology

## 2021-12-26 ENCOUNTER — Ambulatory Visit (INDEPENDENT_AMBULATORY_CARE_PROVIDER_SITE_OTHER): Payer: Medicaid Other | Admitting: Cardiology

## 2021-12-26 VITALS — BP 106/70 | HR 83 | Ht 71.0 in | Wt 307.8 lb

## 2021-12-26 DIAGNOSIS — R0989 Other specified symptoms and signs involving the circulatory and respiratory systems: Secondary | ICD-10-CM

## 2021-12-26 DIAGNOSIS — I340 Nonrheumatic mitral (valve) insufficiency: Secondary | ICD-10-CM | POA: Diagnosis not present

## 2021-12-26 MED ORDER — METOPROLOL SUCCINATE ER 25 MG PO TB24: 25.0000 mg | ORAL_TABLET | Freq: Every day | ORAL | 1 refills | Status: DC

## 2021-12-26 MED ORDER — FUROSEMIDE 20 MG PO TABS: 20.0000 mg | ORAL_TABLET | ORAL | 0 refills | Status: DC

## 2021-12-26 NOTE — Progress Notes (Signed)
Cardio-Obstetrics Clinic  New Evaluation  Date:  12/26/2021   ID:  Jamie Gates, DOB 19-Oct-2000, MRN [id number]  PCP:  Precious Gilding, DO   [facility] Providers Cardiologist:  Berniece Salines, DO  Electrophysiologist:  None       Referring MD: Precious Gilding, DO   Chief Complaint: " I am doing fine but have intermittent shortness of breath"  History of Present Illness:    Jamie Gates is a 21 y.o. female [G2P0100] who is being seen today for the evaluation of depressed ejection fraction/Dilated cardiomyopathy at the request of Precious Gilding, DO.   I saw the patient on on 12/31/2000 at that time she was pregnant. Echo showed depressed ejection 40-45%. We discussed plans. Unfortunately she did not follow up. She tells me today that she lost her last baby girl at 51 weeks. After that she stopped taking the beta blocker.   Her echo in 03/2021 showed low normal EF.  Recent echo back down mildy depressed 40-45% 11/25/2021.  She admits to some shortness of breath on exertion. No chest pain  Prior CV Studies Reviewed: The following studies were reviewed today:  11/25/2021 IMPRESSIONS   1. Left ventricular ejection fraction, by estimation, is 40 to 45%. The left ventricle has mildly decreased function. The left ventricle  demonstrates global hypokinesis. The left ventricular internal cavity size was moderately dilated. Left ventricular  diastolic parameters are indeterminate. The average left ventricular global longitudinal strain is -16.7 %. The global longitudinal strain is  abnormal.   2. Right ventricular systolic function is normal. The right ventricular size is normal.   3. The mitral valve is normal in structure. Moderate to severe mitral valve regurgitation. No evidence of mitral stenosis.   4. The aortic valve is tricuspid. Aortic valve regurgitation is not visualized. No aortic stenosis is present.   5. The inferior vena cava is normal in size with greater than 50% respiratory  variability, suggesting right atrial pressure of 3 mmHg.   FINDINGS   Left Ventricle: Left ventricular ejection fraction, by estimation, is 40 to 45%. The left ventricle has mildly decreased function. The left  ventricle demonstrates global hypokinesis. The average left ventricular global longitudinal strain is -16.7 %.  The global longitudinal strain is abnormal. The left ventricular internal cavity size was moderately dilated. There is no left ventricular  hypertrophy. Left ventricular diastolic parameters are indeterminate.   Right Ventricle: The right ventricular size is normal. Right ventricular  systolic function is normal.   Left Atrium: Left atrial size was normal in size.   Right Atrium: Right atrial size was normal in size.   Pericardium: There is no evidence of pericardial effusion.   Mitral Valve: The mitral valve is normal in structure. Moderate to severe  mitral valve regurgitation. No evidence of mitral valve stenosis. MV peak  gradient, 5.0 mmHg. The mean mitral valve gradient is 2.0 mmHg.   Tricuspid Valve: The tricuspid valve is normal in structure. Tricuspid  valve regurgitation is trivial. No evidence of tricuspid stenosis.   Aortic Valve: The aortic valve is tricuspid. Aortic valve regurgitation is  not visualized. No aortic stenosis is present.   Pulmonic Valve: The pulmonic valve was normal in structure. Pulmonic valve  regurgitation is trivial. No evidence of pulmonic stenosis.   Aorta: The aortic root is normal in size and structure.   Venous: The inferior vena cava is normal in size with greater than 50%  respiratory variability, suggesting right atrial pressure of 3 mmHg.   IAS/Shunts:  No atrial level shunt detected by color flow Doppler.      Past Medical History:  Diagnosis Date   Allergy    Cardiomegaly 12/18/2012   still working with cardiology-esp now with pregnancy   Chlamydia 09/23/2018   Treated 09/23/2018   Gonorrhea 09/23/2018   Treated  09/23/2018   Obesity    Ovarian cyst     Past Surgical History:  Procedure Laterality Date   TYMPANOSTOMY TUBE PLACEMENT        OB History     Gravida  2   Para  1   Term  0   Preterm  1   AB  0   Living  0      SAB  0   IAB  0   Ectopic  0   Multiple  0   Live Births  0               Current Medications: Current Meds  Medication Sig   aspirin EC 81 MG tablet Take 81 mg by mouth in the morning. Swallow whole.   Prenatal 27-1 MG TABS Take 1 tablet by mouth daily.     Allergies:   Patient has no known allergies.   Social History   Socioeconomic History   Marital status: Single    Spouse name: Not on file   Number of children: Not on file   Years of education: Not on file   Highest education level: Not on file  Occupational History   Not on file  Tobacco Use   Smoking status: Former    Packs/day: 0.00    Types: Cigars, Cigarettes    Quit date: 09/03/2020    Years since quitting: 1.3   Smokeless tobacco: Never  Vaping Use   Vaping Use: Never used  Substance and Sexual Activity   Alcohol use: No   Drug use: No   Sexual activity: Yes    Birth control/protection: Condom  Other Topics Concern   Not on file  Social History Narrative   Patient is living with her dad at this time in school still getting passing grades   Social Determinants of Health   Financial Resource Strain: Low Risk  (01/10/2021)   Overall Financial Resource Strain (CARDIA)    Difficulty of Paying Living Expenses: Not hard at all  Food Insecurity: No Food Insecurity (03/10/2021)   Hunger Vital Sign    Worried About Running Out of Food in the Last Year: Never true    Ran Out of Food in the Last Year: Never true  Recent Concern: Food Insecurity - Food Insecurity Present (02/20/2021)   Hunger Vital Sign    Worried About Running Out of Food in the Last Year: Never true    Ran Out of Food in the Last Year: Sometimes true  Transportation Needs: No Transportation Needs  (03/10/2021)   PRAPARE - Administrator, Civil Service (Medical): No    Lack of Transportation (Non-Medical): No  Physical Activity: Not on file  Stress: Not on file  Social Connections: Not on file      Family History  Problem Relation Age of Onset   Raynaud syndrome Mother    Diabetes Father    Hyperlipidemia Father    Hypertension Father       ROS:   Please see the history of present illness.      Review of Systems  Constitution: Negative for decreased appetite, fever and weight gain.  HENT: Negative for congestion,  ear discharge, hoarse voice and sore throat.   Eyes: Negative for discharge, redness, vision loss in right eye and visual halos.  Cardiovascular: Reports shortness of breath on exertion.  Negative for chest pain,  leg swelling, orthopnea and palpitations.  Respiratory: Negative for cough, hemoptysis, shortness of breath and snoring.   Endocrine: Negative for heat intolerance and polyphagia.  Hematologic/Lymphatic: Negative for bleeding problem. Does not bruise/bleed easily.  Skin: Negative for flushing, nail changes, rash and suspicious lesions.  Musculoskeletal: Negative for arthritis, joint pain, muscle cramps, myalgias, neck pain and stiffness.  Gastrointestinal: Negative for abdominal pain, bowel incontinence, diarrhea and excessive appetite.  Genitourinary: Negative for decreased libido, genital sores and incomplete emptying.  Neurological: Negative for brief paralysis, focal weakness, headaches and loss of balance.  Psychiatric/Behavioral: Negative for altered mental status, depression and suicidal ideas.  Allergic/Immunologic: Negative for HIV exposure and persistent infections.     Labs/EKG Reviewed:    EKG:   EKG is was ordered today.  The ekg ordered today demonstrates sinus rhythm, heart rate 75 bpm  Recent Labs: 03/14/2021: ALT 13; BUN 10; Creatinine, Ser 0.65; Hemoglobin 13.1; Platelets 200; Potassium 3.8; Sodium 136   Recent Lipid  Panel No results found for: "CHOL", "TRIG", "HDL", "CHOLHDL", "LDLCALC", "LDLDIRECT"  Physical Exam:    VS:  LMP 09/04/2021 (Exact Date)     Wt Readings from Last 3 Encounters:  11/05/21 (!) 300 lb 3.2 oz (136.2 kg)  03/24/21 293 lb 4.8 oz (133 kg)  03/20/21 298 lb 11.2 oz (135.5 kg)     GEN:  Well nourished, well developed in no acute distress HEENT: Normal NECK: No JVD; No carotid bruits LYMPHATICS: No lymphadenopathy CARDIAC: RRR, no murmurs, rubs, gallops RESPIRATORY:  Clear to auscultation without rales, wheezing or rhonchi  ABDOMEN: Soft, non-tender, non-distended MUSCULOSKELETAL:  No edema; No deformity  SKIN: Warm and dry NEUROLOGIC:  Alert and oriented x 3 PSYCHIATRIC:  Normal affect    Risk Assessment/Risk Calculators:                  ASSESSMENT & PLAN:    Depressed ejection fraction Heart failure with reduced ejection fraction Obesity affecting pregnancy  She was found to have heart failure with reduced ejection fraction EF 40 to 45%.  Etiology unknown. Previously improved in 03/2021 to 50-55% now back down.    Given his young patient suspect that this etiology may be nonischemic but will limited to any evaluation to understand her cardiomyopathy.  Treatment is paramount here for this patient.  She is no longer on her toprol xl will restart.   She have moderate to severe MR and significant shortness of breath. Will Give Lasix 20mg  x 2 doses - she will take it a week apart.   Today's visit included educating her about the diagnosis and my suspicion for nonischemic cardiomyopathy.  Hopefully postpartum we can consider treatment repeat echocardiogram as well as probably cardiac MRI.  I have educated patient about warning signs understand if she gets worsening shortness of breath to notify my office.  I encouraged the patient to keep her weight gain less than 20 pounds during her pregnancy.  Follow-up in 12 weeks or sooner if needed.  There are no Patient  Instructions on file for this visit.   Dispo:  No follow-ups on file.   Medication Adjustments/Labs and Tests Ordered: Current medicines are reviewed at length with the patient today.  Concerns regarding medicines are outlined above.  Tests Ordered: No orders of the defined  types were placed in this encounter.  Medication Changes: No orders of the defined types were placed in this encounter.

## 2021-12-26 NOTE — Patient Instructions (Signed)
Medication Instructions:  Your physician has recommended you make the following change in your medication:  START: Toprol XL 12.5mg  daily START: Lasix 20mg  Once this week and once next week. (ONLY A TWO DOSE MEDICATION FOR YOU) *If you need a refill on your cardiac medications before your next appointment, please call your pharmacy*   Lab Work: NONE If you have labs (blood work) drawn today and your tests are completely normal, you will receive your results only by: [place] (if you have MyChart) OR A paper copy in the mail If you have any lab test that is abnormal or we need to change your treatment, we will call you to review the results.   Testing/Procedures: Your physician has requested that you have an echocardiogram. Echocardiography is a painless test that uses sound waves to create images of your heart. It provides your doctor with information about the size and shape of your heart and how well your heart's chambers and valves are working. This procedure takes approximately one hour. There are no restrictions for this procedure. Please do NOT wear cologne, perfume, aftershave, or lotions (deodorant is allowed). Please arrive 15 minutes prior to your appointment time.    Follow-Up: At [facility], you and your health needs are our priority.  As part of our continuing mission to provide you with exceptional heart care, we have created designated Provider Care Teams.  These Care Teams include your primary Cardiologist (physician) and Advanced Practice Providers (APPs -  Physician Assistants and Nurse Practitioners) who all work together to provide you with the care you need, when you need it.  We recommend signing up for the patient portal called "MyChart".  Sign up information is provided on this After Visit Summary.  MyChart is used to connect with patients for Virtual Visits (Telemedicine).  Patients are able to view lab/test results, encounter notes, upcoming  appointments, etc.  Non-urgent messages can be sent to your provider as well.   To learn more about what you can do with MyChart, go to [email or web address].    Your next appointment:   3 month(s)  The format for your next appointment:   In Person  Provider:   Berniece Salines  [facility] [address], [place], [place] [address]  Or [facility]  [address], [place], [place] [address]

## 2021-12-31 ENCOUNTER — Encounter (HOSPITAL_COMMUNITY): Payer: Self-pay | Admitting: Obstetrics and Gynecology

## 2021-12-31 ENCOUNTER — Inpatient Hospital Stay (HOSPITAL_COMMUNITY)
Admission: AD | Admit: 2021-12-31 | Discharge: 2021-12-31 | Disposition: A | Discharge status: Home / Self Care | Payer: Medicaid Other | Attending: Obstetrics and Gynecology | Admitting: Obstetrics and Gynecology

## 2021-12-31 ENCOUNTER — Ambulatory Visit (INDEPENDENT_AMBULATORY_CARE_PROVIDER_SITE_OTHER): Payer: Medicaid Other

## 2021-12-31 DIAGNOSIS — Z3A16 16 weeks gestation of pregnancy: Secondary | ICD-10-CM | POA: Diagnosis not present

## 2021-12-31 DIAGNOSIS — O09292 Supervision of pregnancy with other poor reproductive or obstetric history, second trimester: Secondary | ICD-10-CM | POA: Diagnosis not present

## 2021-12-31 DIAGNOSIS — I5022 Chronic systolic (congestive) heart failure: Secondary | ICD-10-CM | POA: Insufficient documentation

## 2021-12-31 DIAGNOSIS — O26892 Other specified pregnancy related conditions, second trimester: Secondary | ICD-10-CM | POA: Insufficient documentation

## 2021-12-31 DIAGNOSIS — O99412 Diseases of the circulatory system complicating pregnancy, second trimester: Secondary | ICD-10-CM | POA: Insufficient documentation

## 2021-12-31 DIAGNOSIS — Z111 Encounter for screening for respiratory tuberculosis: Secondary | ICD-10-CM

## 2021-12-31 DIAGNOSIS — O99820 Streptococcus B carrier state complicating pregnancy: Secondary | ICD-10-CM | POA: Insufficient documentation

## 2021-12-31 DIAGNOSIS — O99212 Obesity complicating pregnancy, second trimester: Secondary | ICD-10-CM | POA: Insufficient documentation

## 2021-12-31 DIAGNOSIS — R519 Headache, unspecified: Secondary | ICD-10-CM | POA: Insufficient documentation

## 2021-12-31 LAB — URINALYSIS, ROUTINE W REFLEX MICROSCOPIC
Bilirubin Urine: NEGATIVE
Glucose, UA: NEGATIVE mg/dL
Hgb urine dipstick: NEGATIVE
Ketones, ur: NEGATIVE mg/dL
Nitrite: NEGATIVE
Protein, ur: NEGATIVE mg/dL
Specific Gravity, Urine: 1.018 (ref 1.005–1.030)
pH: 7 (ref 5.0–8.0)

## 2021-12-31 MED ORDER — ACETAMINOPHEN 500 MG PO TABS
500.0000 mg | ORAL_TABLET | Freq: Two times a day (BID) | ORAL | Status: DC | PRN
  Administered 2021-12-31: 500 mg via ORAL
  Filled 2021-12-31: qty 1

## 2021-12-31 MED ORDER — ACETAMINOPHEN 500 MG PO TABS: 1000.0000 mg | ORAL_TABLET | Freq: Once | ORAL | Status: DC

## 2021-12-31 NOTE — Progress Notes (Signed)
Patient is here for a PPD placement.  PPD placed in left forearm @ 10:30 am.  Patient will return 01/02/2022 to have PPD read. Talbot Grumbling, RN

## 2021-12-31 NOTE — MAU Note (Signed)
Jamie Gates is a 21 y.o. at [redacted]w[redacted]d here in MAU reporting: been having a HA all morning, did not  anything for the pain, didn't know what to take.  Started on new med from Cardiologist on Monday, doesn't know if that is causing it or what. No hx of migraines Onset of complaint: started this morning Pain score: 6 Vitals:   12/31/21 1557  BP: 117/76  Pulse: 91  Resp: 18  Temp: 98.4 F (36.9 C)  SpO2: 98%      Lab orders placed from triage:  none

## 2021-12-31 NOTE — MAU Provider Note (Cosign Needed Addendum)
History     CSN: [id number]  Arrival date and time: 12/31/21 1535   Event Date/Time   First Provider Initiated Contact with Patient 12/31/21 1636      Chief Complaint  Patient presents with   Headache   Jamie Gates is a 89 G2P0100 at [place] w/Hx HFrEF, fetal demise, and obesity who presents w/CC of headache. This morning woke up with a headache. She normally gets headaches that are worse in the morning that go away on their own. She tried sleeping it off, swishing salt water. Had to call out of work because this HA did not go away. When she bends over or moves too fast she gets a sharp pain in her head. First time she's had a headache like this. Hurts across her eyebrows, like a band, feels pounding. Sensitive to light. No visions or smell changes. Yesterday when she was driving she was have a hard time seeing far away, saw close better. Could see lines, but everything was generally blurry. Normally only wears reading glasses. HA has gotten worse since this morning. The more she stood up, the more her head hurt. Feels better lying down. Did not try any medications. Put a wet towel on her forehead which helped her sleep. Waking back up from sleep made it worse. This is her first headache this year. When she bends over, the HA is the same sensation, just worse. HA has been coming and going since onset. Now feels 4/10. Has not been constant. Has had "migraines" before that she did not come to hospital before. She has gained 11 lbs so far in pregnancy.  No known sick exposures. She works as a Engineer, petroleum, doesn't use any chemicals in cleaning. No allergies. Never had a similar episode in prior pregnancy. Lives in a really old rooming house. Everybody smokes there. She does not smoke or use other substances.   Taking ASA, metoprolol, and prescribed PNV. Was given a pill for her heart that she takes once a week for two weeks. Does not know the pill. Has not taken it yet. No recent antibiotics.   No fevers,  chills, N/V/D, abdominal pain, passing out, dizziness. No trouble with speech or moving muscles. Has had some runny nose lately.   Headache     OB History     Gravida  2   Para  1   Term  0   Preterm  1   AB  0   Living  0      SAB  0   IAB  0   Ectopic  0   Multiple  0   Live Births  0           Past Medical History:  Diagnosis Date   Allergy    Cardiomegaly 12/18/2012   still working with cardiology-esp now with pregnancy   Chlamydia 09/23/2018   Treated 09/23/2018   Gonorrhea 09/23/2018   Treated 09/23/2018   Obesity    Ovarian cyst     Past Surgical History:  Procedure Laterality Date   TYMPANOSTOMY TUBE PLACEMENT      Family History  Problem Relation Age of Onset   Raynaud syndrome Mother    Diabetes Father    Hyperlipidemia Father    Hypertension Father     Social History   Tobacco Use   Smoking status: Former    Packs/day: 0.00    Types: Cigars, Cigarettes    Quit date: 09/03/2020    Years since quitting: 1.3  Smokeless tobacco: Never  Vaping Use   Vaping Use: Never used  Substance Use Topics   Alcohol use: No   Drug use: No    Allergies: No Known Allergies  No medications prior to admission.    Review of Systems  Neurological:  Positive for headaches.  All other systems reviewed and are negative.  Physical Exam   Blood pressure (!) 117/55, pulse 81, temperature 98.1 F (36.7 C), temperature source Oral, resp. rate 18, height 5\' 11"  (1.803 m), weight (!) 141.3 kg, last menstrual period 09/04/2021, SpO2 100 %.  Physical Exam Vitals and nursing note reviewed.  Constitutional:      General: She is not in acute distress.    Appearance: She is obese. She is not ill-appearing, toxic-appearing or diaphoretic.  HENT:     Head: Normocephalic and atraumatic.     Comments: No facial pain to palpation    Mouth/Throat:     Mouth: Mucous membranes are moist.  Eyes:     General: No scleral icterus.    Extraocular Movements:  Extraocular movements intact.     Right eye: Normal extraocular motion and no nystagmus.     Left eye: Normal extraocular motion and no nystagmus.     Pupils: Pupils are equal, round, and reactive to light.  Neck:     Meningeal: Kernig's sign absent.  Cardiovascular:     Rate and Rhythm: Normal rate and regular rhythm.     Comments: Could not appreciate a murmur at this time Pulmonary:     Effort: Pulmonary effort is normal.     Breath sounds: Normal breath sounds.  Abdominal:     General: Bowel sounds are normal. There is no distension.     Palpations: Abdomen is soft.     Tenderness: There is no abdominal tenderness. There is no guarding.  Musculoskeletal:     Cervical back: Normal range of motion and neck supple. No rigidity.  Lymphadenopathy:     Cervical: No cervical adenopathy.  Skin:    General: Skin is warm and dry.     Capillary Refill: Capillary refill takes less than 2 seconds.  Neurological:     Mental Status: She is alert and oriented to person, place, and time.     Cranial Nerves: No cranial nerve deficit, dysarthria or facial asymmetry.     Sensory: No sensory deficit.     Motor: No weakness.     Coordination: Coordination normal.     Comments: CN: II-XII intact, nml finger to nose, nml heel to shin  Psychiatric:        Mood and Affect: Mood normal.        Speech: Speech normal.        Behavior: Behavior normal.     MAU Course  Procedures  MDM Pt with HFrEF and Hx of fetal demise and headaches presents with headache that has come and gone since this morning. She woke up with the headache, but it is better when lying down. As she had not tried anything for the HA, we gave her 1000mg  of Tylenol. Ddx included, but were not limited to, migraine, headache 2/2 allergies, medication (Toprol) side effect, idiopathic intracranial hypertension in s/o obesity, dehydration, environmental exposure (in old house with neighbors who smoke), infectious, CVA. Her neuro exam was  benign and VSS. After medication and lying in the dark for a while, her symptoms improved. She was most likely experiencing a migraine, which can present bilaterally. She has taken Toprol before without  issues, so less likely to be a side effect from this. Although she could be dehydrated, did not bolus at this time, as she is supposed to be taking Lasix per cardiology. She did report runny nose, but reassuringly did not have facial pain. Would also expect a different pain distribution. She could have IIH, given obesity, worsening when she wakes up and when she bends forward, sensitivity to light, and blurry vision, but patient doing well at this time after Tylenol, and risk of LP outweighs benefit at this point given improvement of Sx with Tylenol. She should follow up outpatient for further evaluation and potential MRI should that continue. I do not suspect infectious cause, as patient denies infectious Sx and vitals normal. As she has improved with Tylenol and is asking to go home, it is safe at this time to continue outpatient evaluation if needed. Gave return precautions.  Assessment and Plan   Pregnancy headache in 2nd trimester - Improved w/Tylenol - Gave Tylenol dosing information in pregnancy - Should f/u outpatient. Gave return precautions. - Further eval outpatient should headaches not resolve with Tylenol in the future  [redacted] wks gestation of pregnancy - Hx of fetal demise at 32 wks - Pregnancy complicated by obesity, HFrEF, HSV not on prophylaxis, RH -, GBS+ - Per cardiology, patient is to gain no more than 20 pounds this pregnancy  Linzie Collin 12/31/2021, 6:30 PM     Attestation of Supervision of Student:  I confirm that I have verified the information documented in the  resident's note and that I have also personally reperformed the history, physical exam and all medical decision making activities.  I have verified that all services and findings are accurately documented in this  student's note; and I agree with management and plan as outlined in the documentation. I have also made any necessary editorial changes.  Message sent to office & Dr. Adrian Blackwater regarding patient being candidate for red chart list.   Judeth Horn, NP Center for [facility], [facility] 12/31/2021 7:09 PM

## 2021-12-31 NOTE — Discharge Instructions (Signed)
For prevention of migraines in pregnancy: -Magnesium, 400mg  by mouth, once daily -Vitamin B2, 400mg  by mouth, once daily  For treatment of migraines in pregnancy: -take medication at the first sign of the pain of a headache, or the first sign of your aura -start with 1000mg  Tylenol (or excedrin tension headache with acetaminophen & caffeine only, NOT aspirin)  -Do not take more than 4000 mg of tylenol (acetaminophen) per day

## 2022-01-02 ENCOUNTER — Ambulatory Visit (INDEPENDENT_AMBULATORY_CARE_PROVIDER_SITE_OTHER): Payer: Medicaid Other

## 2022-01-02 DIAGNOSIS — Z111 Encounter for screening for respiratory tuberculosis: Secondary | ICD-10-CM

## 2022-01-02 LAB — TB SKIN TEST
Induration: 0 mm
TB Skin Test: NEGATIVE

## 2022-01-02 NOTE — Progress Notes (Signed)
Patient is here for a PPD read.  It was placed on 12/31/2021 in the left forearm @ 1030 am.    PPD RESULTS:  Result: negative Induration: 0 mm  Letter created and given to patient for documentation purposes. Talbot Grumbling, RN

## 2022-01-05 ENCOUNTER — Ambulatory Visit (INDEPENDENT_AMBULATORY_CARE_PROVIDER_SITE_OTHER): Payer: Medicaid Other | Admitting: Obstetrics & Gynecology

## 2022-01-05 ENCOUNTER — Other Ambulatory Visit (HOSPITAL_COMMUNITY)
Admission: RE | Admit: 2022-01-05 | Discharge: 2022-01-05 | Disposition: A | Discharge status: Home / Self Care | Payer: Medicaid Other | Source: Ambulatory Visit | Attending: Obstetrics & Gynecology | Admitting: Obstetrics & Gynecology

## 2022-01-05 ENCOUNTER — Encounter: Payer: Self-pay | Admitting: Obstetrics & Gynecology

## 2022-01-05 ENCOUNTER — Encounter: Payer: Self-pay | Admitting: Advanced Practice Midwife

## 2022-01-05 VITALS — BP 115/74 | HR 88 | Wt 311.6 lb

## 2022-01-05 DIAGNOSIS — O09899 Supervision of other high risk pregnancies, unspecified trimester: Secondary | ICD-10-CM

## 2022-01-05 DIAGNOSIS — O09892 Supervision of other high risk pregnancies, second trimester: Secondary | ICD-10-CM

## 2022-01-05 DIAGNOSIS — Z3A17 17 weeks gestation of pregnancy: Secondary | ICD-10-CM | POA: Insufficient documentation

## 2022-01-05 DIAGNOSIS — O99212 Obesity complicating pregnancy, second trimester: Secondary | ICD-10-CM

## 2022-01-05 DIAGNOSIS — O26899 Other specified pregnancy related conditions, unspecified trimester: Secondary | ICD-10-CM

## 2022-01-05 DIAGNOSIS — O0990 Supervision of high risk pregnancy, unspecified, unspecified trimester: Secondary | ICD-10-CM | POA: Diagnosis not present

## 2022-01-05 DIAGNOSIS — I5020 Unspecified systolic (congestive) heart failure: Secondary | ICD-10-CM

## 2022-01-05 DIAGNOSIS — O0992 Supervision of high risk pregnancy, unspecified, second trimester: Secondary | ICD-10-CM

## 2022-01-05 DIAGNOSIS — I340 Nonrheumatic mitral (valve) insufficiency: Secondary | ICD-10-CM

## 2022-01-05 DIAGNOSIS — O99210 Obesity complicating pregnancy, unspecified trimester: Secondary | ICD-10-CM | POA: Diagnosis not present

## 2022-01-05 DIAGNOSIS — Z6791 Unspecified blood type, Rh negative: Secondary | ICD-10-CM

## 2022-01-05 DIAGNOSIS — O99412 Diseases of the circulatory system complicating pregnancy, second trimester: Secondary | ICD-10-CM

## 2022-01-05 DIAGNOSIS — R760 Raised antibody titer: Secondary | ICD-10-CM

## 2022-01-05 DIAGNOSIS — O99419 Diseases of the circulatory system complicating pregnancy, unspecified trimester: Secondary | ICD-10-CM

## 2022-01-05 HISTORY — DX: Unspecified systolic (congestive) heart failure: I50.20

## 2022-01-05 MED ORDER — ASPIRIN 81 MG PO TBEC: 162.0000 mg | DELAYED_RELEASE_TABLET | Freq: Every day | ORAL | 2 refills | Status: DC

## 2022-01-05 NOTE — Progress Notes (Signed)
History:   Jamie Gates is a [name] at [redacted]w[redacted]d by LMP being seen today for her first obstetrical visit.  Her obstetrical history is significant for complicated cardiac history and IUFD at 32 weeks for last pregnancy.  See problem list below: Patient Active Problem List   Diagnosis Date Noted   Heart failure with reduced ejection fraction ([place]) 01/05/2022   Supervision of high risk pregnancy, antepartum 12/10/2021   History of maternal cardiomyopathy, currently pregnant 11/06/2021   History of IUFD at 85 weeks 03/11/2021   Mitral regurgitation of mother during pregnancy 03/11/2021   Cardiomyopathy, nonischemic ([place]) 02/05/2021   Rh negative state in antepartum period 01/07/2021   Adjustment disorder with depressed mood 09/30/2020   Abnormal antibody titer (anti-Lewis antibodies) 09/30/2020   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 Harriet Masson in Cardiology.  Patient does intend to breast feed. Pregnancy history fully reviewed.  Patient reports no complaints.      HISTORY: OB History  Gravida Para Term Preterm AB Living  2 1 0 1 0 0  SAB IAB Ectopic Multiple Live Births  0 0 0 0 0    # Outcome Date GA Lbr Len/2nd Weight Sex Delivery Anes PTL Lv  2 Current           1 Preterm 03/15/21 [redacted]w[redacted]d 04:24 / 00:07 2 lb 15.1 oz (1.335 kg) F Vag-Spont EPI  FD     Birth Comments: very tight nuchal cord     Name: Mirian Mo FD     Apgar1: 0  Apgar5: 0    Past Medical History:  Diagnosis Date   Abnormal antibody titer (anti-Lewis antibodies) 09/30/2020   Adjustment disorder with depressed mood 09/30/2020   Cardiomegaly 12/18/2012   still working with cardiology-esp now with pregnancy   Cardiomyopathy, nonischemic ([place]) 02/05/2021   Chlamydia 09/23/2018   Treated 09/23/2018   Gonorrhea 09/23/2018   Treated 09/23/2018   Heart failure with reduced ejection fraction ([place])  01/05/2022   Her echo in 03/2021 showed low normal EF.  Recent echo back down mildy depressed 40-45% 11/25/2021.    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   Migraines    Mitral regurgitation    Morbid obesity (HCC)    Ovarian cyst    Past Surgical History:  Procedure Laterality Date   TYMPANOSTOMY TUBE PLACEMENT     Family History  Problem Relation Age of Onset   Raynaud syndrome Mother    Diabetes Father    Hyperlipidemia Father    Hypertension Father    Social History   Tobacco Use   Smoking status: Former    Packs/day: 0.00    Types: Cigars, Cigarettes    Quit date: 09/03/2020    Years since quitting: 1.3   Smokeless tobacco: Never  Vaping Use   Vaping Use: Never used  Substance Use Topics   Alcohol use: No   Drug use: No   No Known Allergies Current Outpatient Medications on File Prior to Visit  Medication Sig Dispense Refill   furosemide (LASIX) 20 MG tablet Take 1 tablet (20 mg total) by mouth once a week. 2 tablet 0   metoprolol succinate (TOPROL XL) 25 MG 24 hr tablet Take 1 tablet (25 mg total) by mouth daily. 90 tablet 1   Prenatal 27-1 MG TABS Take 1 tablet by mouth daily. 30 tablet 10   No current facility-administered medications on file prior to visit.  Review of Systems Pertinent items noted in HPI and remainder of comprehensive ROS otherwise negative.   Physical Exam:   Vitals:   01/05/22 1559  BP: 115/74  Pulse: 88  Weight: (!) 311 lb 9.6 oz (141.3 kg)   Fetal Heart Rate (bpm): 146 by bedside scan. General: well-developed, well-nourished female in no acute distress  Breasts:  deferred  Skin: normal coloration and turgor, no rashes  Neurologic: oriented, normal, negative, normal mood  Extremities: normal strength, tone, and muscle mass, ROM of all joints is normal  HEENT PERRLA, extraocular movement intact and sclera clear, anicteric  Neck supple and no masses  Cardiovascular: regular rate and rhythm  Respiratory:  no respiratory distress, normal breath  sounds  Abdomen: soft, non-tender; bowel sounds normal; no masses,  no organomegaly  Pelvic: normal external genitalia, no lesions, normal vaginal mucosa, normal vaginal discharge, normal cervix, pap smear done. Exam done in the presence of a chaperone.     Assessment:    Pregnancy: G2P0100 Patient Active Problem List   Diagnosis Date Noted   Heart failure with reduced ejection fraction ([place]) 01/05/2022   Supervision of high risk pregnancy, antepartum 12/10/2021   History of maternal cardiomyopathy, currently pregnant 11/06/2021   History of IUFD at 53 weeks 03/11/2021   Mitral regurgitation of mother during pregnancy 03/11/2021   Cardiomyopathy, nonischemic ([place]) 02/05/2021   Rh negative state in antepartum period 01/07/2021   Adjustment disorder with depressed mood 09/30/2020   Abnormal antibody titer (anti-Lewis antibodies) 09/30/2020   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 History of maternal cardiomyopathy, currently pregnant 2. Heart failure with reduced ejection fraction ([place]) 3. Mitral regurgitation of mother during pregnancy Followed by Cardiology, will continue to follow their recommendations. On Lasix 20 mg weekly, Toprol XL 25 mg daily, ASA 162 mg daily.  Fetal ECHO ordered.  Labs ordered as per discussion with MFM (RED CHART patient) - B12 and Folate Panel - Ferritin - Brain natriuretic peptide - aspirin EC 81 MG tablet; Take 2 tablets (162 mg total) by mouth daily. Take after 12 weeks for prevention of preeclampsia later in pregnancy  Dispense: 300 tablet; Refill: 2 - US Fetal Echocardiography; Future  4. Maternal morbid obesity, antepartum (HCC) Recommended TWG 11-20 lbs.  Will check labs today. - Comprehensive metabolic panel - Hemoglobin A1c - TSH Rfx on Abnormal to Free T4 - Protein / creatinine ratio, urine  5. Abnormal antibody titer (anti-Lewis antibodies) History of  Lewis antibodies, not clinically significant. - Antibody screen  6. Rh negative state in antepartum period Will get Rhogam around 28 weeks.  7. [redacted] weeks gestation of pregnancy 8. Supervision of high risk pregnancy, antepartum - CBC/D/Plt+RPR+Rh+ABO+RubIgG... - Culture, OB Urine - PANORAMA PRENATAL TEST FULL PANEL - HORIZON CUSTOM - AFP, Serum, Open Spina Bifida - Cytology - PAP Initial labs drawn. Continue prenatal vitamins. Problem list reviewed and updated. Genetic Screening discussed, Panorama and Horizon: ordered. Ultrasound discussed; fetal anatomic survey: scheduled. Anticipatory guidance about prenatal visits given including labs, ultrasounds, and testing. Patient was encouraged to use MyChart to review results, send requests, and have questions addressed.   The nature of [place] for [facility]/Faculty Practice with multiple MDs and Advanced Practice Providers was explained to patient; also emphasized that residents, students are part of our team. Routine obstetric precautions reviewed. Encouraged to seek out care at our office or emergency room [facility] MAU  preferred) for urgent and/or emergent concerns. Return in about 4 weeks (around 02/02/2022) for OFFICE OB VISIT (MD only).     Verita Schneiders, MD, [place] for [employer], [place]

## 2022-01-05 NOTE — Progress Notes (Signed)
Informal bedside ultrasound performed to assess FHR, FHR 142bpm with fetal movement visualized  Koren Bound RN BSN 01/05/22

## 2022-01-06 ENCOUNTER — Encounter: Payer: Self-pay | Admitting: *Deleted

## 2022-01-06 LAB — BRAIN NATRIURETIC PEPTIDE: BNP: 58.1 pg/mL (ref 0.0–100.0)

## 2022-01-06 LAB — TSH RFX ON ABNORMAL TO FREE T4: TSH: 0.782 u[IU]/mL (ref 0.450–4.500)

## 2022-01-07 LAB — CYTOLOGY - PAP
Chlamydia: NEGATIVE
Comment: NEGATIVE
Comment: NEGATIVE
Comment: NEGATIVE
Comment: NORMAL
Diagnosis: UNDETERMINED — AB
High risk HPV: NEGATIVE
Neisseria Gonorrhea: NEGATIVE
Trichomonas: NEGATIVE

## 2022-01-07 LAB — URINE CULTURE, OB REFLEX

## 2022-01-07 LAB — PROTEIN / CREATININE RATIO, URINE
Creatinine, Urine: 336.4 mg/dL
Protein, Ur: 43.9 mg/dL
Protein/Creat Ratio: 130 mg/g creat (ref 0–200)

## 2022-01-07 LAB — CULTURE, OB URINE

## 2022-01-08 ENCOUNTER — Encounter: Payer: Self-pay | Admitting: Obstetrics & Gynecology

## 2022-01-08 DIAGNOSIS — R87610 Atypical squamous cells of undetermined significance on cytologic smear of cervix (ASC-US): Secondary | ICD-10-CM | POA: Insufficient documentation

## 2022-01-08 LAB — COMPREHENSIVE METABOLIC PANEL
ALT: 8 IU/L (ref 0–32)
AST: 7 IU/L (ref 0–40)
Albumin/Globulin Ratio: 1.3 (ref 1.2–2.2)
Albumin: 3.5 g/dL — ABNORMAL LOW (ref 4.0–5.0)
Alkaline Phosphatase: 74 IU/L (ref 44–121)
BUN/Creatinine Ratio: 15 (ref 9–23)
BUN: 9 mg/dL (ref 6–20)
Bilirubin Total: <0.2 mg/dL (ref 0.0–1.2)
CO2: 18 mmol/L — ABNORMAL LOW (ref 20–29)
Calcium: 8.7 mg/dL (ref 8.7–10.2)
Chloride: 104 mmol/L (ref 96–106)
Creatinine, Ser: 0.6 mg/dL (ref 0.57–1.00)
Globulin, Total: 2.8 g/dL (ref 1.5–4.5)
Glucose: 86 mg/dL (ref 70–99)
Potassium: 4.2 mmol/L (ref 3.5–5.2)
Sodium: 138 mmol/L (ref 134–144)
Total Protein: 6.3 g/dL (ref 6.0–8.5)
eGFR: 131 mL/min/{1.73_m2} (ref >59)

## 2022-01-08 LAB — B12 AND FOLATE PANEL
Folate: 13.5 ng/mL (ref >3.0)
Vitamin B-12: 323 pg/mL (ref 232–1245)

## 2022-01-08 LAB — CBC/D/PLT+RPR+RH+ABO+RUBIGG...
Basophils Absolute: 0 10*3/uL (ref 0.0–0.2)
Basos: 0 %
EOS (ABSOLUTE): 0.1 10*3/uL (ref 0.0–0.4)
Eos: 1 %
HCV Ab: NONREACTIVE
HIV Screen 4th Generation wRfx: NONREACTIVE
Hematocrit: 34.6 % (ref 34.0–46.6)
Hemoglobin: 11.6 g/dL (ref 11.1–15.9)
Hepatitis B Surface Ag: NEGATIVE
Immature Grans (Abs): 0 10*3/uL (ref 0.0–0.1)
Immature Granulocytes: 0 %
Lymphocytes Absolute: 2.2 10*3/uL (ref 0.7–3.1)
Lymphs: 24 %
MCH: 28.4 pg (ref 26.6–33.0)
MCHC: 33.5 g/dL (ref 31.5–35.7)
MCV: 85 fL (ref 79–97)
Monocytes Absolute: 0.5 10*3/uL (ref 0.1–0.9)
Monocytes: 6 %
Neutrophils Absolute: 6.6 10*3/uL (ref 1.4–7.0)
Neutrophils: 69 %
Platelets: 203 10*3/uL (ref 150–450)
RBC: 4.08 x10E6/uL (ref 3.77–5.28)
RDW: 13.7 % (ref 11.7–15.4)
RPR Ser Ql: NONREACTIVE
Rh Factor: NEGATIVE
Rubella Antibodies, IGG: 5.29 index (ref >0.99)
WBC: 9.5 10*3/uL (ref 3.4–10.8)

## 2022-01-08 LAB — AFP, SERUM, OPEN SPINA BIFIDA
AFP MoM: 0.78
AFP Value: 23.8 ng/mL
Gest. Age on Collection Date: 17.6 weeks
Maternal Age At EDD: 22.1 yr
OSBR Risk 1 IN: 10000
Test Results:: NEGATIVE
Weight: 311 [lb_av]

## 2022-01-08 LAB — FERRITIN: Ferritin: 54 ng/mL (ref 15–150)

## 2022-01-08 LAB — AB SCR+ANTIBODY ID: Antibody Screen: POSITIVE — AB

## 2022-01-08 LAB — HEMOGLOBIN A1C
Est. average glucose Bld gHb Est-mCnc: 108 mg/dL
Hgb A1c MFr Bld: 5.4 % (ref 4.8–5.6)

## 2022-01-08 LAB — HCV INTERPRETATION

## 2022-01-12 ENCOUNTER — Ambulatory Visit (HOSPITAL_BASED_OUTPATIENT_CLINIC_OR_DEPARTMENT_OTHER): Payer: Medicaid Other | Admitting: Maternal & Fetal Medicine

## 2022-01-12 ENCOUNTER — Other Ambulatory Visit: Payer: Self-pay | Admitting: *Deleted

## 2022-01-12 ENCOUNTER — Ambulatory Visit: Payer: Medicaid Other | Attending: Advanced Practice Midwife

## 2022-01-12 ENCOUNTER — Ambulatory Visit: Payer: Medicaid Other | Admitting: *Deleted

## 2022-01-12 VITALS — BP 119/58 | HR 72

## 2022-01-12 DIAGNOSIS — O0990 Supervision of high risk pregnancy, unspecified, unspecified trimester: Secondary | ICD-10-CM | POA: Diagnosis not present

## 2022-01-12 DIAGNOSIS — I428 Other cardiomyopathies: Secondary | ICD-10-CM | POA: Diagnosis not present

## 2022-01-12 DIAGNOSIS — R638 Other symptoms and signs concerning food and fluid intake: Secondary | ICD-10-CM

## 2022-01-12 DIAGNOSIS — Z8759 Personal history of other complications of pregnancy, childbirth and the puerperium: Secondary | ICD-10-CM

## 2022-01-12 DIAGNOSIS — O99212 Obesity complicating pregnancy, second trimester: Secondary | ICD-10-CM

## 2022-01-12 DIAGNOSIS — O99412 Diseases of the circulatory system complicating pregnancy, second trimester: Secondary | ICD-10-CM

## 2022-01-12 DIAGNOSIS — Z3A18 18 weeks gestation of pregnancy: Secondary | ICD-10-CM | POA: Diagnosis not present

## 2022-01-12 DIAGNOSIS — O09292 Supervision of pregnancy with other poor reproductive or obstetric history, second trimester: Secondary | ICD-10-CM

## 2022-01-12 DIAGNOSIS — Z362 Encounter for other antenatal screening follow-up: Secondary | ICD-10-CM

## 2022-01-12 LAB — HORIZON CUSTOM: REPORT SUMMARY: NEGATIVE

## 2022-01-12 NOTE — Progress Notes (Signed)
MFM Consult Note Patient Name: Jamie Gates  Patient MRN:   [id number]  Referring provider: [facility]  Reason for Consult: Cardiomyopathy, mitral regurgitation, obesity and history of intrauterine fetal demise at 32 weeks.  HPI: Jamie Gates is a 21 y.o. G2P01 at [redacted]w[redacted]d here for ultrasound and consultation.   RE nonischemic cardiomyopathy/regurgitation: The patient has a history of cardiomyopathy diagnosed as a child. She was able to have a vaginal delivery her last pregnancy. Her most recent ejection fraction of 45% and moderate mitral regurgitation. She reports that this is of unknown origin.  She follows with a cardiologist Dr.Tobb whom she has consistent follow-up.  Her last echo was in September 2023.  She has had baseline laboratory work that is normal.  Her only symptom at this time is some shortness of breath after walking up flight of stairs.  She denies orthopnea, paroxysmal nocturnal dyspnea, lower extremity swelling or shortness of breath or chest pain at rest.  She is compliant with Lasix, metoprolol and aspirin.  She knows to follow-up with her cardiologist regularly and report any concerning signs or symptoms.  RE IUFD at 32w: Patient has a history of intrauterine fetal demise at 32 weeks.  She reports that the umbilical cord was found to be tightly wrapped around the fetus's neck.  Placental pathology also showed numerous villous infarcts comprising approximately 20% of the placenta.  There is also the presence of accelerated villous maturation which is a sign of placental ischemia.  She is blood type A- and has ant-Lewis A antibodies but these are not known to be associated with IUFD. I do not see where she had antiphospholipid testing done.  RE obesity: We discussed that obesity complicates nearly every aspect of pregnancy with moderate increased risk of diabetes, hypertensive disorders, growth abnormalities of the fetus, increased risk of complications during labor and delivery.   Appropriate weight gain is approximately 11 to 20 pounds.  Review of Systems: A review of systems was performed and was negative except per HPI   Vitals and Physical Exam See intake sheet for vitals Sitting comfortably on the sonogram table Nonlabored breathing Normal rate and rhythm Abdomen is nontender  Sonographic findings Single intrauterine pregnancy. Observed fetal cardiac activity. Cephalic presentation. Fetal anatomy that was well seen appears normal without evidence of soft markers. Not all fetal structures were well seen due to a technically diffcult exam and the anatomic survey remains incomplete with limited views of the 4CH, LVOT, AoA, spine, diaphragm, and feet.  Fetal biometry shows the estimated fetal weight at the 74 percentile.  Amniotic fluid volume: Within normal limits. Placenta: Posterior fundal. Cervix: Closed. Normal appearance by transabdominal scan with a cervical length of 4 cm. Adnexa: No abnormality visualized.  Genetic testing: low risk NIPS  Assessment -Non-ischemic cardiomyopathy -History of intrauterine fetal demise, 32 weeks in prior pregnancy Plan -Serial growth ultrasounds every 4 weeks until delivery -Antiphospholipid testing if not previously done.  I do not see where this has been done.  Her OB provider can order this at her next visit or we can do so at her next ultrasound. -Antenatal testing to start around 30 weeks given her history of intrauterine fetal demise at 32 weeks.  We can do nonstress tests weekly starting around 30 weeks and then do biophysical profiles at 32 weeks and beyond every week until delivery. -Continue medications: Lasix, metoprolol, aspirin and prenatal vitamin. -Continue follow-up with cardiology at least every trimester and sooner if indicated. -Repeat echocardiogram per cardiology, likely every trimester  at least or in the settings of symptoms consistent with worsening cardiomyopathy -Anesthesia consult around 28 to  32 weeks -Continue to maintain euvolemia to avoid cardiac ischemia associated with hypovolemia and pulmonary edema associated with hypervolemia.  I spent 60 minutes reviewing the patients chart, including labs and images as well as counseling the patient about her medical conditions.  Braxton Feathers  MFM, [facility]   01/12/2022  9:27 AM

## 2022-01-15 ENCOUNTER — Encounter: Payer: Self-pay | Admitting: Student

## 2022-01-20 ENCOUNTER — Encounter: Payer: Self-pay | Admitting: General Practice

## 2022-01-21 LAB — PANORAMA PRENATAL TEST FULL PANEL:PANORAMA TEST PLUS 5 ADDITIONAL MICRODELETIONS: FETAL FRACTION: 6.8

## 2022-02-05 ENCOUNTER — Ambulatory Visit (INDEPENDENT_AMBULATORY_CARE_PROVIDER_SITE_OTHER): Payer: Medicaid Other | Admitting: Family Medicine

## 2022-02-05 VITALS — BP 117/77 | HR 105 | Wt 315.7 lb

## 2022-02-05 DIAGNOSIS — O0990 Supervision of high risk pregnancy, unspecified, unspecified trimester: Secondary | ICD-10-CM

## 2022-02-05 DIAGNOSIS — I428 Other cardiomyopathies: Secondary | ICD-10-CM

## 2022-02-05 DIAGNOSIS — Z8279 Family history of other congenital malformations, deformations and chromosomal abnormalities: Secondary | ICD-10-CM | POA: Diagnosis not present

## 2022-02-05 DIAGNOSIS — O0992 Supervision of high risk pregnancy, unspecified, second trimester: Secondary | ICD-10-CM

## 2022-02-05 DIAGNOSIS — O26892 Other specified pregnancy related conditions, second trimester: Secondary | ICD-10-CM

## 2022-02-05 DIAGNOSIS — Z6791 Unspecified blood type, Rh negative: Secondary | ICD-10-CM

## 2022-02-05 DIAGNOSIS — Z3A22 22 weeks gestation of pregnancy: Secondary | ICD-10-CM

## 2022-02-05 DIAGNOSIS — O09892 Supervision of other high risk pregnancies, second trimester: Secondary | ICD-10-CM | POA: Diagnosis not present

## 2022-02-05 DIAGNOSIS — O99412 Diseases of the circulatory system complicating pregnancy, second trimester: Secondary | ICD-10-CM

## 2022-02-05 DIAGNOSIS — Z8759 Personal history of other complications of pregnancy, childbirth and the puerperium: Secondary | ICD-10-CM

## 2022-02-05 DIAGNOSIS — R87610 Atypical squamous cells of undetermined significance on cytologic smear of cervix (ASC-US): Secondary | ICD-10-CM

## 2022-02-05 DIAGNOSIS — I340 Nonrheumatic mitral (valve) insufficiency: Secondary | ICD-10-CM

## 2022-02-05 NOTE — Progress Notes (Signed)
C/o rash on her feet.  Jamie Gates

## 2022-02-06 NOTE — Progress Notes (Signed)
   PRENATAL VISIT NOTE  Subjective:  Jamie Gates is a [name] at [redacted]w[redacted]d being seen today for ongoing prenatal care.  She is currently monitored for the following issues for this high-risk pregnancy and has Maternal morbid obesity, antepartum (HCC); Cardiomegaly; HSV infection; Adjustment disorder with depressed mood; Abnormal antibody titer (anti-Lewis antibodies); Rh negative state in antepartum period; Cardiomyopathy, nonischemic (HCC); History of IUFD at 32 weeks; Mitral regurgitation of mother during pregnancy; History of maternal cardiomyopathy, currently pregnant; Supervision of high risk pregnancy, antepartum; Heart failure with reduced ejection fraction (HCC); and Atypical squamous cell changes of undetermined significance (ASCUS) with negative high risk human papilloma virus (HPV) pap 01/05/2022 on their problem list.  Patient reports no bleeding, no contractions, no cramping, and no leaking.  Contractions: Not present.  .  Movement: Present. Denies leaking of fluid.   The following portions of the patient's history were reviewed and updated as appropriate: allergies, current medications, past family history, past medical history, past social history, past surgical history and problem list.   Objective:   Vitals:   02/05/22 1600  BP: 117/77  Pulse: (!) 105  Weight: (!) 315 lb 11.2 oz (143.2 kg)    Fetal Status: Fetal Heart Rate (bpm): 142   Movement: Present     General:  Alert, oriented and cooperative. Patient is in no acute distress.  Skin: Skin is warm and dry. No rash noted.   Cardiovascular: Normal heart rate noted  Respiratory: Normal respiratory effort, no problems with respiration noted  Abdomen: Soft, gravid, appropriate for gestational age.  Pain/Pressure: Absent     Pelvic: Cervical exam deferred        Extremities: Normal range of motion.  Edema: None  Mental Status: Normal mood and affect. Normal behavior. Normal judgment and thought content.    Assessment and Plan:  Pregnancy: G2P0100 at [redacted]w[redacted]d 1. Supervision of high risk pregnancy, antepartum - Antiphospholipid Syndrome Comp  2. Mitral regurgitation of mother during pregnancy Scheduled to see Ob Cardiology   3. History of IUFD at 32 weeks - serial growth u/s with antenatal testing starting at 30 weeks per MFM  4. Cardiomyopathy, nonischemic (HCC)  5. Rh negative state in antepartum period Will need rhogam @ 28 weeks   6. ASCUS of cervix with negative HPV   Preterm labor symptoms and general obstetric precautions including but not limited to vaginal bleeding, contractions, leaking of fluid and fetal movement were reviewed in detail with the patient. Please refer to After Visit Summary for other counseling recommendations.   No follow-ups on file.  Future Appointments  Date Time Provider Department Center  02/09/2022 12:30 PM [facility] NURSE [facility]  02/09/2022 12:45 PM [facility] US6 [facility]  03/09/2022  8:15 AM Milas Hock, MD [facility]  03/09/2022  8:50 AM [facility] LAB [facility]  03/10/2022 11:35 AM [facility] ECHO 5 [id number]  03/20/2022  2:00 PM Tobb, Lavona [place], DO [facility] None    Celedonio Savage, MD

## 2022-02-09 ENCOUNTER — Other Ambulatory Visit: Payer: Self-pay

## 2022-02-09 ENCOUNTER — Ambulatory Visit: Payer: Medicaid Other | Attending: Maternal & Fetal Medicine

## 2022-02-09 ENCOUNTER — Ambulatory Visit: Payer: Medicaid Other

## 2022-02-09 VITALS — BP 112/58 | HR 89

## 2022-02-09 DIAGNOSIS — O99212 Obesity complicating pregnancy, second trimester: Secondary | ICD-10-CM | POA: Diagnosis not present

## 2022-02-09 DIAGNOSIS — Z362 Encounter for other antenatal screening follow-up: Secondary | ICD-10-CM | POA: Insufficient documentation

## 2022-02-09 DIAGNOSIS — R638 Other symptoms and signs concerning food and fluid intake: Secondary | ICD-10-CM | POA: Insufficient documentation

## 2022-02-09 DIAGNOSIS — O0990 Supervision of high risk pregnancy, unspecified, unspecified trimester: Secondary | ICD-10-CM

## 2022-02-09 DIAGNOSIS — E669 Obesity, unspecified: Secondary | ICD-10-CM | POA: Diagnosis not present

## 2022-02-09 DIAGNOSIS — O09292 Supervision of pregnancy with other poor reproductive or obstetric history, second trimester: Secondary | ICD-10-CM

## 2022-02-09 DIAGNOSIS — Z3A22 22 weeks gestation of pregnancy: Secondary | ICD-10-CM | POA: Diagnosis not present

## 2022-02-09 DIAGNOSIS — O358XX0 Maternal care for other (suspected) fetal abnormality and damage, not applicable or unspecified: Secondary | ICD-10-CM

## 2022-02-14 ENCOUNTER — Other Ambulatory Visit: Payer: Self-pay | Admitting: Family Medicine

## 2022-02-16 ENCOUNTER — Encounter: Payer: Self-pay | Admitting: *Deleted

## 2022-02-16 LAB — ANTIPHOSPHOLIPID SYNDROME COMP
APTT: 27.7 s
Anticardiolipin Ab, IgA: <10 [APL'U]
Anticardiolipin Ab, IgG: <10 [GPL'U]
Anticardiolipin Ab, IgM: <10 [MPL'U]
Antiphosphatidylserine IgG: 0 {GPS'U}
Antiphosphatidylserine IgM: 2 {MPS'U}
Antiprothrombin Antibody, IgG: 22 G units — ABNORMAL HIGH
Beta-2 Glycoprotein I, IgA: <10 SAU
Beta-2 Glycoprotein I, IgG: <10 SGU
Beta-2 Glycoprotein I, IgM: <10 SMU
DRVVT Screen Seconds: 24.4 s
Hexagonal Phospholipid Neutral: 4 s
Platelet Neutralization: 2 s

## 2022-03-02 NOTE — L&D Delivery Note (Cosign Needed Addendum)
Delivery Note Jamie Gates is a 22 y.o. G2P1101 at [redacted]w[redacted]d admitted for IOL for cardiomyopathy.   GBS Status:  Positive/-- (03/06 1531) Maximum Maternal Temperature: 99.51F  Labor course: Initial SVE: 1/50/-3. Augmentation with: AROM, Pitocin, Cytotec, IP Foley, and nipple stimulation . She then progressed to complete.  ROM: 24h 33m with clear fluid  Birth: At 1726 a viable female was delivered via spontaneous vaginal delivery (Presentation: direct OA to LOT;  ). Nuchal cord present: No.  Shoulders and body delivered in usual fashion. Infant placed directly on mom's abdomen for bonding/skin-to-skin, baby dried and stimulated. Cord clamped x 2 after 1 minute and cut by FOB.  Cord blood collected.  The placenta separated spontaneously and delivered via gentle cord traction.  Pitocin infused rapidly IV per protocol.  Fundus firm with massage.  Placenta inspected and appears to be intact with a 3 VC.  Placenta/Cord with the following complications: none .  Sponge and instrument count were correct x2.  Intrapartum complications:  None Anesthesia:  epidural Episiotomy: none Lacerations:  R labial, hemostatic, not repaired Suture Repair:  none EBL (mL): 238cc   Infant: APGAR (1 MIN): 9   APGAR (5 MINS): 9   APGAR (10 MINS):    Infant weight: pending  Mom to postpartum.  Baby to Couplet care / Skin to Skin. Placenta to L&D   Plans to Breast and bottlefeed Contraception: Nexplanon Circumcision: unsure  Delivery was attended by Dr. Liliane Gates. Jamie Gates, M.D. PGY-2 Family Medicine Visiting Resident Faculty Practice 05/27/2022 6:03 PM    Fellow Attestation  I saw and evaluated the patient, performing the key elements of the service.I  personally performed or re-performed the history, physical exam, and medical decision making activities of this service and have verified that the service and findings are accurately documented in the resident's note. I developed the  management plan that is described in the resident's note, and I agree with the content, with my edits above.   Uncomplicated vaginal delivery. Will continue metroprolol XL and start lasix for 5 days. Watch blood pressures and cardiac status closely.  Jamie Card, MD,MP[facility] OB Fellow, Faculty Practice  05/27/2022 6:32 PM

## 2022-03-06 NOTE — Progress Notes (Signed)
PRENATAL VISIT NOTE  Subjective:  Jamie Gates is a 22 y.o. G2P0100 at [redacted]w[redacted]d being seen today for ongoing prenatal care.  She is currently monitored for the following issues for this high-risk pregnancy and has Maternal morbid obesity, antepartum ([place]); Cardiomegaly; HSV infection; Adjustment disorder with depressed mood; Abnormal antibody titer (anti-Lewis antibodies); Rh negative state in antepartum period; Cardiomyopathy, nonischemic ([place]); History of IUFD at 22 weeks; Mitral regurgitation of mother during pregnancy; History of maternal cardiomyopathy, currently pregnant; Supervision of high risk pregnancy, antepartum; Heart failure with reduced ejection fraction ([place]); and Atypical squamous cell changes of undetermined significance (ASCUS) with negative high risk human papilloma virus (HPV) pap 01/05/2022 on their problem list.  Patient reports  GERD and fatigue .  Contractions: Not present. Vag. Bleeding: None.  Movement: Present. Denies leaking of fluid.   The following portions of the patient's history were reviewed and updated as appropriate: allergies, current medications, past family history, past medical history, past social history, past surgical history and problem list.   Objective:   Vitals:   03/09/22 0827  BP: (!) 104/59  Pulse: 97  Weight: (!) 316 lb (143.3 kg)    Fetal Status: Fetal Heart Rate (bpm): 138   Movement: Present     General:  Alert, oriented and cooperative. Patient is in no acute distress.  Skin: Skin is warm and dry. No rash noted.   Cardiovascular: Normal heart rate noted  Respiratory: Normal respiratory effort, no problems with respiration noted  Abdomen: Soft, gravid, appropriate for gestational age.  Pain/Pressure: Absent     Pelvic: Cervical exam deferred        Extremities: Normal range of motion.  Edema: None  Mental Status: Normal mood and affect. Normal behavior. Normal judgment and thought content.   Assessment and Plan:  Pregnancy:  G2P0100 at [redacted]w[redacted]d 1. Mitral regurgitation of mother during pregnancy See below  2. HSV infection Valtrex at 35w  3. Rh negative state in antepartum period Rhogam at 28w  4. History of IUFD at 12 weeks In s/o FGR. Growth wnl thus far, but next growth is 1/12 and will continue serial growth through delivery.  Reviewed plan of twice weekly monitoring if FGR again per discussion with group.   5. History of maternal cardiomyopathy, currently pregnant From childhood Last EF in September was 40-45% Reviewed concerning s/sx.  Had BP cuff today sent to her pharmacy. Working on getting babyscripts. Takes BP sometimes with her sister's cuff and those are normal.   6. Supervision of high risk pregnancy, antepartum 28 week labs today Rhogam at next appt- f/u in 2 weeks for ongoing [place] d/t HR pregnancy TDAP and rhogam at next appt.  Has some reflux - sent in Pepcid.  For fatigue, we will check on hgb today anyway and she has echo tomorrow. Suspect normal for pregnancy but will monitor closely.   Preterm labor symptoms and general obstetric precautions including but not limited to vaginal bleeding, contractions, leaking of fluid and fetal movement were reviewed in detail with the patient. Please refer to After Visit Summary for other counseling recommendations.   No follow-ups on file.  Future Appointments  Date Time Provider [place]  03/09/2022  8:50 AM [facility] LAB [facility]  03/10/2022 11:35 AM [facility] ECHO 5 [id number]  03/13/2022  3:30 PM [facility] NURSE [facility]  03/13/2022  3:45 PM [facility] US5 [facility]  03/20/2022  2:00 PM Tobb, Kardie, DO [facility] None  04/06/2022 11:15 AM Aletha Halim, MD [facility]  [facility]    Radene Gunning, MD

## 2022-03-09 ENCOUNTER — Other Ambulatory Visit: Payer: Medicaid Other

## 2022-03-09 ENCOUNTER — Other Ambulatory Visit: Payer: Self-pay

## 2022-03-09 ENCOUNTER — Ambulatory Visit (INDEPENDENT_AMBULATORY_CARE_PROVIDER_SITE_OTHER): Payer: Medicaid Other | Admitting: Obstetrics and Gynecology

## 2022-03-09 VITALS — BP 104/59 | HR 97 | Wt 316.0 lb

## 2022-03-09 DIAGNOSIS — O26899 Other specified pregnancy related conditions, unspecified trimester: Secondary | ICD-10-CM

## 2022-03-09 DIAGNOSIS — O99419 Diseases of the circulatory system complicating pregnancy, unspecified trimester: Secondary | ICD-10-CM

## 2022-03-09 DIAGNOSIS — Z8759 Personal history of other complications of pregnancy, childbirth and the puerperium: Secondary | ICD-10-CM

## 2022-03-09 DIAGNOSIS — O0990 Supervision of high risk pregnancy, unspecified, unspecified trimester: Secondary | ICD-10-CM | POA: Diagnosis not present

## 2022-03-09 DIAGNOSIS — K2100 Gastro-esophageal reflux disease with esophagitis, without bleeding: Secondary | ICD-10-CM

## 2022-03-09 DIAGNOSIS — I340 Nonrheumatic mitral (valve) insufficiency: Secondary | ICD-10-CM

## 2022-03-09 DIAGNOSIS — Z6791 Unspecified blood type, Rh negative: Secondary | ICD-10-CM

## 2022-03-09 DIAGNOSIS — B009 Herpesviral infection, unspecified: Secondary | ICD-10-CM

## 2022-03-09 DIAGNOSIS — O09899 Supervision of other high risk pregnancies, unspecified trimester: Secondary | ICD-10-CM

## 2022-03-09 DIAGNOSIS — Z3A26 26 weeks gestation of pregnancy: Secondary | ICD-10-CM

## 2022-03-09 MED ORDER — BLOOD PRESSURE KIT DEVI: 1.0000 | Freq: Once | 0 refills | Status: AC

## 2022-03-09 MED ORDER — METOPROLOL SUCCINATE ER 25 MG PO TB24: 25.0000 mg | ORAL_TABLET | Freq: Every day | ORAL | 1 refills | Status: DC

## 2022-03-09 MED ORDER — FAMOTIDINE 20 MG PO TABS: 20.0000 mg | ORAL_TABLET | Freq: Two times a day (BID) | ORAL | 3 refills | Status: DC

## 2022-03-09 NOTE — Patient Instructions (Signed)
Blood pressure cuff from [facility], [place], [place] [address] [phone] Hours: Sunday Closed Monday 9AM-6PM Tuesday 9AM-6PM Wednesday 9AM-6PM Thursday 9AM-6PM Friday           9AM-6PM Saturday         10 AM-1PM

## 2022-03-10 ENCOUNTER — Encounter: Payer: Self-pay | Admitting: Obstetrics and Gynecology

## 2022-03-10 ENCOUNTER — Ambulatory Visit (HOSPITAL_COMMUNITY): Payer: Medicaid Other | Attending: Cardiology

## 2022-03-10 DIAGNOSIS — R0989 Other specified symptoms and signs involving the circulatory and respiratory systems: Secondary | ICD-10-CM | POA: Diagnosis not present

## 2022-03-10 DIAGNOSIS — O99019 Anemia complicating pregnancy, unspecified trimester: Secondary | ICD-10-CM | POA: Insufficient documentation

## 2022-03-10 LAB — GLUCOSE TOLERANCE, 2 HOURS W/ 1HR
Glucose, 1 hour: 94 mg/dL (ref 70–179)
Glucose, 2 hour: 78 mg/dL (ref 70–152)
Glucose, Fasting: 79 mg/dL (ref 70–91)

## 2022-03-10 LAB — CBC
Hematocrit: 31.7 % — ABNORMAL LOW (ref 34.0–46.6)
Hemoglobin: 10.7 g/dL — ABNORMAL LOW (ref 11.1–15.9)
MCH: 28.8 pg (ref 26.6–33.0)
MCHC: 33.8 g/dL (ref 31.5–35.7)
MCV: 85 fL (ref 79–97)
Platelets: 203 10*3/uL (ref 150–450)
RBC: 3.72 x10E6/uL — ABNORMAL LOW (ref 3.77–5.28)
RDW: 12.6 % (ref 11.7–15.4)
WBC: 9.6 10*3/uL (ref 3.4–10.8)

## 2022-03-10 LAB — SYPHILIS: RPR W/REFLEX TO RPR TITER AND TREPONEMAL ANTIBODIES, TRADITIONAL SCREENING AND DIAGNOSIS ALGORITHM: RPR Ser Ql: NONREACTIVE

## 2022-03-10 LAB — ECHOCARDIOGRAM COMPLETE
Area-P 1/2: 5.7 cm2
MV M vel: 4.76 m/s
MV Peak grad: 90.6 mmHg
Radius: 0.4 cm
S' Lateral: 4.53 cm

## 2022-03-10 LAB — HIV ANTIBODY (ROUTINE TESTING W REFLEX): HIV Screen 4th Generation wRfx: NONREACTIVE

## 2022-03-10 MED ORDER — FERROUS SULFATE 325 (65 FE) MG PO TABS: 325.0000 mg | ORAL_TABLET | ORAL | 3 refills | Status: DC

## 2022-03-13 ENCOUNTER — Ambulatory Visit (HOSPITAL_BASED_OUTPATIENT_CLINIC_OR_DEPARTMENT_OTHER): Payer: Medicaid Other | Admitting: Maternal & Fetal Medicine

## 2022-03-13 ENCOUNTER — Ambulatory Visit: Payer: Medicaid Other | Admitting: *Deleted

## 2022-03-13 ENCOUNTER — Ambulatory Visit: Payer: Medicaid Other | Attending: Obstetrics

## 2022-03-13 ENCOUNTER — Other Ambulatory Visit: Payer: Self-pay | Admitting: Obstetrics

## 2022-03-13 VITALS — BP 110/59 | HR 105

## 2022-03-13 DIAGNOSIS — Z3A27 27 weeks gestation of pregnancy: Secondary | ICD-10-CM

## 2022-03-13 DIAGNOSIS — O368130 Decreased fetal movements, third trimester, not applicable or unspecified: Secondary | ICD-10-CM | POA: Diagnosis not present

## 2022-03-13 DIAGNOSIS — Z8759 Personal history of other complications of pregnancy, childbirth and the puerperium: Secondary | ICD-10-CM

## 2022-03-13 DIAGNOSIS — O365920 Maternal care for other known or suspected poor fetal growth, second trimester, not applicable or unspecified: Secondary | ICD-10-CM

## 2022-03-13 DIAGNOSIS — O0990 Supervision of high risk pregnancy, unspecified, unspecified trimester: Secondary | ICD-10-CM

## 2022-03-13 DIAGNOSIS — O365990 Maternal care for other known or suspected poor fetal growth, unspecified trimester, not applicable or unspecified: Secondary | ICD-10-CM | POA: Insufficient documentation

## 2022-03-13 DIAGNOSIS — O09292 Supervision of pregnancy with other poor reproductive or obstetric history, second trimester: Secondary | ICD-10-CM | POA: Diagnosis not present

## 2022-03-13 DIAGNOSIS — N83201 Unspecified ovarian cyst, right side: Secondary | ICD-10-CM

## 2022-03-13 DIAGNOSIS — O3482 Maternal care for other abnormalities of pelvic organs, second trimester: Secondary | ICD-10-CM | POA: Diagnosis not present

## 2022-03-13 DIAGNOSIS — O99212 Obesity complicating pregnancy, second trimester: Secondary | ICD-10-CM | POA: Diagnosis not present

## 2022-03-13 DIAGNOSIS — E669 Obesity, unspecified: Secondary | ICD-10-CM

## 2022-03-13 NOTE — Procedures (Cosign Needed)
Jamie Gates Jul 15, 2000 [redacted]w[redacted]d  Fetus A Non-Stress Test Interpretation for 03/13/22  Indication: IUGR, hx IUFD, decreased fetal movement  Fetal Heart Rate A Mode: External Baseline Rate (A): 140 bpm Variability: Moderate Accelerations: 10 x 10 Decelerations: None Multiple birth?: No  Uterine Activity Mode: Palpation, Toco Contraction Frequency (min): none Resting Tone Palpated: Relaxed  Interpretation (Fetal Testing) Nonstress Test Interpretation: Reactive Overall Impression: Reassuring for gestational age Comments: Dr. Epimenio Sarin reviewed tracing

## 2022-03-13 NOTE — Progress Notes (Signed)
MFM Consult Note Patient Name: Jamie Gates  Patient MRN:   [id number]  Referring provider: [facility]  Reason for Consult: FGR, hx of IUFD   HPI: Jamie Gates is a 22 y.o. G2P0100 at [redacted]w[redacted]d here for ultrasound and consultation.   Counseling  I discussed the finding of fetal growth restriction (FGR) with the patient today. The ultrasound shows an overall growth at the 31st percentile and the abdominal circumference at the 9th percentile. The umbilical artery Dopplers are normal. The NST was reassuring for gestational age. I counseled her about the clinical significance of the Doppler findings and antenatal testing. I discussed the various causes of growth restriction including constitutionally small fetus, placental insufficiency, genetic problems and chronic maternal disease. Currently there is no evidence of sonographic stigmata suggesting infection or aneuploidy.  I discussed the most likely cause of her fetal growth restriction is either a constitutionally small fetus or placental insufficiency. We discussed it is often challenging to differentiate between a fetus that is constitutionally small but is fulfilling its growth potential and a fetus that is not fulfilling its growth potential because of an underlying pathologic condition. Approximately 70% of fetuses with birth weight below the 10th percentile for gestational age are constitutionally small; in the remaining 30%, the cause of the small size is pathologic, meeting intrauterine growth restriction definition. I also discussed the importance of antenatal fetal surveillance including antenatal testing and umbilical artery Doppler assessment to reduce the risk of stillbirth.  I discussed the management going forward in the pregnancy with potential alteration in the timing of delivery. Currently she feels well and denies headache, vision changes, right upper quadrant pain, contractions, vaginal bleeding or loss of fluid.  She reports good fetal  movement.   Jamie Gates has noted decreased fetal movement and was instructed on when to go to the hospital.   Review of Systems: A review of systems was performed and was negative except per HPI   Vitals and Physical Exam See intake sheet for vitals Sitting comfortably on the sonogram table Nonlabored breathing Normal rate and rhythm Abdomen is nontender  Sonographic findings Single intrauterine pregnancy. Fetal cardiac activity:  Observed and appears normal. Presentation: Cephalic. Interval fetal anatomy appears normal. Fetal biometry shows the estimated fetal weight at the 31 percentile and the abdominal circumference at the 9th percentile.  Amniotic fluid volume: AFI: 16.3 cm.  Placenta: Posterior Fundal. Umbilical artery dopplers findings: -S/D:4.17 which are normal at this gestational age.  -Absent end-diastolic flow: No.  -Reversed end-diastolic flow:  No. NST was reassuring for gestational age.   Assessment/Plan FGR - I discussed the risk of stillbirth, especially given her history. Since the NST is reassuring for gestational age she can follow up next week.  - Continue weekly UA dopplers until delivery.  - Continue to avoid tobacco use and exposure to harmful substances - Weekly NST   - Weekly BPP to start at 32 weeks. Add twice weekly NST if UA Dopplers have peroids of absent end diastolic flow to allow for twice weekly antenatal assessment or if EFW/AC <3%.  - Betamethasone indicated if absent or reversed flow is seen or antenatal testing is abnormal in the future  - Serial growth [place] every 3 weeks until delivery  - Delivery likley around 37 weeks or sooner if inidcated.  - With cases of early onset growth restriction with onset at less than 32 weeks, amniocentesis to assess chromosomal MicroArray and CMV is recommended.  The patient was offered this and declined.   The  patient had time to ask questions which were answered to her satisfaction.  She verbalized  understanding and request to proceed with the plan. I spent 20 minutes total in patient care including chart review and counseling with the patient.  [facility]  I spent 30 minutes reviewing the patients chart, including labs and images as well as counseling the patient about her medical conditions.  Valeda Malm  MFM, [place]   03/13/2022  4:55 PM

## 2022-03-16 ENCOUNTER — Other Ambulatory Visit: Payer: Self-pay | Admitting: *Deleted

## 2022-03-16 DIAGNOSIS — O365990 Maternal care for other known or suspected poor fetal growth, unspecified trimester, not applicable or unspecified: Secondary | ICD-10-CM

## 2022-03-17 ENCOUNTER — Ambulatory Visit (HOSPITAL_BASED_OUTPATIENT_CLINIC_OR_DEPARTMENT_OTHER): Payer: Medicaid Other | Admitting: *Deleted

## 2022-03-17 ENCOUNTER — Ambulatory Visit: Payer: Medicaid Other | Admitting: *Deleted

## 2022-03-17 ENCOUNTER — Inpatient Hospital Stay (HOSPITAL_COMMUNITY)
Admission: AD | Admit: 2022-03-17 | Discharge: 2022-03-17 | Disposition: A | Discharge status: Home / Self Care | Payer: Medicaid Other | Attending: Family Medicine | Admitting: Family Medicine

## 2022-03-17 ENCOUNTER — Encounter (HOSPITAL_COMMUNITY): Payer: Self-pay | Admitting: Family Medicine

## 2022-03-17 ENCOUNTER — Other Ambulatory Visit: Payer: Self-pay | Admitting: *Deleted

## 2022-03-17 ENCOUNTER — Other Ambulatory Visit: Payer: Self-pay

## 2022-03-17 ENCOUNTER — Ambulatory Visit: Payer: Medicaid Other | Attending: Maternal & Fetal Medicine

## 2022-03-17 ENCOUNTER — Encounter: Payer: Self-pay | Admitting: *Deleted

## 2022-03-17 VITALS — BP 146/86 | HR 108

## 2022-03-17 DIAGNOSIS — O09292 Supervision of pregnancy with other poor reproductive or obstetric history, second trimester: Secondary | ICD-10-CM | POA: Diagnosis not present

## 2022-03-17 DIAGNOSIS — E669 Obesity, unspecified: Secondary | ICD-10-CM | POA: Diagnosis not present

## 2022-03-17 DIAGNOSIS — O0990 Supervision of high risk pregnancy, unspecified, unspecified trimester: Secondary | ICD-10-CM | POA: Insufficient documentation

## 2022-03-17 DIAGNOSIS — O365920 Maternal care for other known or suspected poor fetal growth, second trimester, not applicable or unspecified: Secondary | ICD-10-CM | POA: Diagnosis not present

## 2022-03-17 DIAGNOSIS — I429 Cardiomyopathy, unspecified: Secondary | ICD-10-CM

## 2022-03-17 DIAGNOSIS — O365990 Maternal care for other known or suspected poor fetal growth, unspecified trimester, not applicable or unspecified: Secondary | ICD-10-CM | POA: Insufficient documentation

## 2022-03-17 DIAGNOSIS — Z3A27 27 weeks gestation of pregnancy: Secondary | ICD-10-CM

## 2022-03-17 DIAGNOSIS — O3482 Maternal care for other abnormalities of pelvic organs, second trimester: Secondary | ICD-10-CM

## 2022-03-17 DIAGNOSIS — Z3689 Encounter for other specified antenatal screening: Secondary | ICD-10-CM | POA: Diagnosis not present

## 2022-03-17 DIAGNOSIS — N83209 Unspecified ovarian cyst, unspecified side: Secondary | ICD-10-CM | POA: Diagnosis not present

## 2022-03-17 DIAGNOSIS — O99212 Obesity complicating pregnancy, second trimester: Secondary | ICD-10-CM | POA: Diagnosis not present

## 2022-03-17 NOTE — Procedures (Signed)
Pluma Diniz 2000/11/15 [redacted]w[redacted]d  Fetus A Non-Stress Test Interpretation for 03/17/22  Indication: IUGR  Fetal Heart Rate A Mode: External Baseline Rate (A): 145 bpm Variability: Moderate Accelerations: 10 x 10 Decelerations: Variable Multiple birth?: No  Uterine Activity Mode: Palpation, Toco Contraction Frequency (min): none Resting Tone Palpated: Relaxed  Interpretation (Fetal Testing) Nonstress Test Interpretation: Reactive Overall Impression: Reassuring for gestational age Comments: Dr. Gertie Exon reviewed tracing. Patient sent to MAU for extended fetal monitoring for variable decelerations.

## 2022-03-17 NOTE — MAU Note (Signed)
Jamie Gates is a 22 y.o. at [redacted]w[redacted]d here in MAU reporting: sent over for prolonged monitoring due to variable decelerations. Pt denies pain, bleeding, and LOF. +FM  Onset of complaint: today  Pain score: 0/10  Vitals:   03/17/22 1608  BP: (!) 106/55  Pulse: 98  Resp: 20  Temp: 98 F (36.7 C)  SpO2: 100%     FHT:EFM applied in room  Lab orders placed from triage: none

## 2022-03-17 NOTE — MAU Provider Note (Signed)
History     CSN: [id number]  Arrival date and time: 03/17/22 1550   Event Date/Time   First Provider Initiated Contact with Patient 03/17/22 1615      Chief Complaint  Patient presents with   Fetal Monitoring   HPI  Jamie CourserViviyona Bevans is a 22 y.o. G2P0100 at 258w5d who presents for prolonged fetal monitoring. Patient was seen at [facility] today for a follow up ultrasound and NST. Dr. Grace BushyBooker reported variables on the fetal tracing and sent patient to MAU for further monitoring. Patient denies any pain.  She denies any vaginal bleeding, discharge, and leaking of fluid. Denies any constipation, diarrhea or any urinary complaints. Reports normal fetal movement.   OB History     Gravida  2   Para  1   Term  0   Preterm  1   AB  0   Living  0      SAB  0   IAB  0   Ectopic  0   Multiple  0   Live Births  0           Past Medical History:  Diagnosis Date   Abnormal antibody titer (anti-Lewis antibodies) 09/30/2020   Adjustment disorder with depressed mood 09/30/2020   Cardiomegaly 12/18/2012   still working with cardiology-esp now with pregnancy   Cardiomyopathy, nonischemic (HCC) 02/05/2021   Chlamydia 09/23/2018   Treated 09/23/2018   Gonorrhea 09/23/2018   Treated 09/23/2018   Heart failure with reduced ejection fraction (HCC) 01/05/2022   Her echo in 03/2021 showed low normal EF.  Recent echo back down mildy depressed 40-45% 11/25/2021.    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   Migraines    Mitral regurgitation    Morbid obesity (HCC)    Ovarian cyst     Past Surgical History:  Procedure Laterality Date   TYMPANOSTOMY TUBE PLACEMENT      Family History  Problem Relation Age of Onset   Raynaud syndrome Mother    Diabetes Father    Hyperlipidemia Father    Hypertension Father    Asthma Neg Hx    Stroke Neg Hx     Social History   Tobacco Use   Smoking status: Former    Packs/day: 0.00    Types: Cigars, Cigarettes    Quit date: 09/03/2020    Years since  quitting: 1.5   Smokeless tobacco: Never  Vaping Use   Vaping Use: Never used  Substance Use Topics   Alcohol use: No   Drug use: No    Allergies: No Known Allergies  Medications Prior to Admission  Medication Sig Dispense Refill Last Dose   aspirin EC 81 MG tablet Take 2 tablets (162 mg total) by mouth daily. Take after 12 weeks for prevention of preeclampsia later in pregnancy 300 tablet 2 03/16/2022   famotidine (PEPCID) 20 MG tablet Take 1 tablet (20 mg total) by mouth 2 (two) times daily. 60 tablet 3 03/17/2022   ferrous sulfate (FERROUSUL) 325 (65 FE) MG tablet Take 1 tablet (325 mg total) by mouth every other day. 30 tablet 3 03/17/2022   metoprolol succinate (TOPROL XL) 25 MG 24 hr tablet Take 1 tablet (25 mg total) by mouth daily. 90 tablet 1 03/17/2022   Prenatal 27-1 MG TABS Take 1 tablet by mouth daily. 30 tablet 10 03/17/2022    Review of Systems  Constitutional: Negative.  Negative for fatigue and fever.  HENT: Negative.    Respiratory: Negative.  Negative for shortness of breath.  Cardiovascular: Negative.  Negative for chest pain.  Gastrointestinal: Negative.  Negative for abdominal pain, constipation, diarrhea, nausea and vomiting.  Genitourinary: Negative.  Negative for dysuria.  Neurological: Negative.  Negative for dizziness and headaches.   Physical Exam   Blood pressure (!) 106/55, pulse 98, temperature 98 F (36.7 C), temperature source Oral, resp. rate 20, last menstrual period 09/04/2021, SpO2 100 %.  Patient Vitals for the past 24 hrs:  BP Temp Temp src Pulse Resp SpO2  03/17/22 1608 (!) 106/55 98 F (36.7 C) Oral 98 20 100 %    Physical Exam Vitals and nursing note reviewed.  Constitutional:      General: She is not in acute distress.    Appearance: She is well-developed.  HENT:     Head: Normocephalic.  Eyes:     Pupils: Pupils are equal, round, and reactive to light.  Cardiovascular:     Rate and Rhythm: Normal rate and regular rhythm.      Heart sounds: Normal heart sounds.  Pulmonary:     Effort: Pulmonary effort is normal. No respiratory distress.     Breath sounds: Normal breath sounds.  Abdominal:     General: Bowel sounds are normal. There is no distension.     Palpations: Abdomen is soft.     Tenderness: There is no abdominal tenderness.  Skin:    General: Skin is warm and dry.  Neurological:     Mental Status: She is alert and oriented to person, place, and time.  Psychiatric:        Mood and Affect: Mood normal.        Behavior: Behavior normal.        Thought Content: Thought content normal.        Judgment: Judgment normal.     Fetal Tracing:  Baseline: 140 Variability: moderate Accels: 10x10 Decels: none  Toco: none  MAU Course  Procedures  No results found for this or any previous visit (from the past 24 hour(s)).   [place] MFM UA CORD DOPPLER  Result Date: 03/17/2022 ----------------------------------------------------------------------  OBSTETRICS REPORT                    (Corrected Final 03/17/2022 03:09 pm) ---------------------------------------------------------------------- Patient Info  ID #:       [id number]                          D.O.B.:  [birth year] (21 yrs)  Name:       Jamie Gates                Visit Date: 03/17/2022 01:29 pm ---------------------------------------------------------------------- Performed By  Attending:        Lin Landsman      Ref. Address:     [address]                    [place]                                                             [place], [place]  [address]  Performed By:     Anabel Halon          [facility]                                     [facility]  Referred By:      [facility]  ---------------------------------------------------------------------- Orders  #  Description                           Code        Ordered By  1  [place] MFM UA CORD DOPPLER                76820.02    Braxton Feathers  2  [place] MFM OB LIMITED                     [id number]    [facility] ----------------------------------------------------------------------  #  Order #                     Accession #                Episode #  1  [id number]                   [id number]                 [id number]  2  [id number]                   [id number]                 [id number] ---------------------------------------------------------------------- Indications  Maternal care for known or suspected poor      O36.5920  fetal growth, second trimester, not applicable  or unspecified IUGR  Obesity complicating pregnancy, second         O99.212  trimester (BMI 41.86)  [redacted] weeks gestation of pregnancy                Z3A.27  Ovarian cyst complicating pregnancy            O34.80  (8x5x4cm)  Poor obstetric history: Previous fetal growth  O09.299  restriction (FGR)  Poor obstetric history: Previous IUFD          O09.299  (@32wks ) ---------------------------------------------------------------------- Fetal Evaluation  Num Of Fetuses:  1  Fetal Heart Rate(bpm):  155  Cardiac Activity:       Observed  Presentation:           Cephalic  Placenta:               Posterior Fundal  P. Cord Insertion:      Previously visualized  Amniotic Fluid  AFI FV:      Within normal limits                              Largest Pocket(cm)                              6.58 ---------------------------------------------------------------------- OB History  Gravidity:    2         Prem:   1  Living:       0 ---------------------------------------------------------------------- Gestational Age  LMP:           27w 5d        Date:  09/04/21                   EDD:   06/11/22  Best:          27w 5d     Det. By:  LMP  (09/04/21)          EDD:   06/11/22  ---------------------------------------------------------------------- Anatomy  Cranium:               Appears normal         Kidneys:                Appear normal  Diaphragm:             Appears normal         Bladder:                Appears normal  Stomach:               Appears normal, left                         sided ---------------------------------------------------------------------- Doppler - Fetal Vessels  Umbilical Artery   S/D     %tile      RI    %tile      PI    %tile     PSV    ADFV    RDFV                                                     (cm/s)   3.72       80    0.73       80    1.21       83    42.05      No      No ---------------------------------------------------------------------- Cervix Uterus Adnexa  Cervix  Not visualized (advanced GA >24wks)  Uterus  No abnormality visualized.  Right Ovary  Simple cyst, measuring 10x7x6cm  Left Ovary  Within normal limits.  Cul De Sac  No free fluid seen.  Adnexa  No abnormality visualized ---------------------------------------------------------------------- Impression  Antenatal testing due to IUGR with an EFW 31% with AC 8%.  NST reactive with good  fetal movement and amniotic fluid  volume  UA Dopplers are normal with no evidence of AEDF or REDF  The NST was reactive and reassuring but had a few variable  decelerations. Given this we have asked Ms. Campo to  prolonged monitoring at the MAU.  I discussed with Len Blalock, CNM who is aware. ---------------------------------------------------------------------- Recommendations  Continue weekly testing with UA Dopplers/NST as previously  recommended. ----------------------------------------------------------------------                   Sander Nephew, MD Electronically Signed Corrected Final Report  03/17/2022 03:09 pm ----------------------------------------------------------------------  [place] MFM OB LIMITED  Result Date:  03/17/2022 ----------------------------------------------------------------------  OBSTETRICS REPORT                    (Corrected Final 03/17/2022 03:09 pm) ---------------------------------------------------------------------- Patient Info  ID #:       [id number]                          D.O.B.:  [birth year] (21 yrs)  Name:       Jamie Gates                Visit Date: 03/17/2022 01:29 pm ---------------------------------------------------------------------- Performed By  Attending:        Sander Nephew      Ref. Address:     [address]                    [place]                                                             [place], [place]  Performed By:     Nevin Bloodgood          Location[facility]                                     [facility] at                                                             [place] [facility]                                                             [facility]  Referred By:  [facility] ---------------------------------------------------------------------- Orders  #  Description                           Code        Ordered By  1  [place] MFM UA CORD DOPPLER                76820.02    Braxton Feathers  2  [place] MFM OB LIMITED                     [id number]    Braxton Feathers ----------------------------------------------------------------------  #  Order #                     Accession #                Episode #  1  [id number]                   [id number]                 [id number]  2  [id number]                   [id number]                 [id number] ---------------------------------------------------------------------- Indications  Maternal care for known or suspected poor      O36.5920  fetal growth, second trimester, not applicable  or unspecified IUGR  Obesity complicating pregnancy, second         O99.212  trimester (BMI 41.86)  [redacted] weeks gestation of pregnancy                 Z3A.27  Ovarian cyst complicating pregnancy            O34.80  (8x5x4cm)  Poor obstetric history: Previous fetal growth  O09.299  restriction (FGR)  Poor obstetric history: Previous IUFD          O09.299  (@32wks ) ---------------------------------------------------------------------- Fetal Evaluation  Num Of Fetuses:         1  Fetal Heart Rate(bpm):  155  Cardiac Activity:       Observed  Presentation:           Cephalic  Placenta:               Posterior Fundal  P. Cord Insertion:      Previously visualized  Amniotic Fluid  AFI FV:      Within normal limits                              Largest Pocket(cm)                              6.58 ---------------------------------------------------------------------- OB History  Gravidity:    2         Prem:   1  Living:       0 ---------------------------------------------------------------------- Gestational Age  LMP:           27w 5d        Date:  09/04/21                   EDD:   06/11/22  Best:  27w 5d     Det. By:  LMP  (09/04/21)          EDD:   06/11/22 ---------------------------------------------------------------------- Anatomy  Cranium:               Appears normal         Kidneys:                Appear normal  Diaphragm:             Appears normal         Bladder:                Appears normal  Stomach:               Appears normal, left                         sided ---------------------------------------------------------------------- Doppler - Fetal Vessels  Umbilical Artery   S/D     %tile      RI    %tile      PI    %tile     PSV    ADFV    RDFV                                                     (cm/s)   3.72       80    0.73       80    1.21       83    42.05      No      No ---------------------------------------------------------------------- Cervix Uterus Adnexa  Cervix  Not visualized (advanced GA >24wks)  Uterus  No abnormality visualized.  Right Ovary  Simple cyst, measuring 10x7x6cm  Left Ovary  Within normal limits.  Cul De Sac   No free fluid seen.  Adnexa  No abnormality visualized ---------------------------------------------------------------------- Impression  Antenatal testing due to IUGR with an EFW 31% with AC 8%.  NST reactive with good fetal movement and amniotic fluid  volume  UA Dopplers are normal with no evidence of AEDF or REDF  The NST was reactive and reassuring but had a few variable  decelerations. Given this we have asked Ms. Sohail to  prolonged monitoring at the MAU.  I discussed with Len Blalock, CNM who is aware. ---------------------------------------------------------------------- Recommendations  Continue weekly testing with UA Dopplers/NST as previously  recommended. ----------------------------------------------------------------------                   Sander Nephew, MD Electronically Signed Corrected Final Report  03/17/2022 03:09 pm ----------------------------------------------------------------------    MDM Labs ordered and reviewed.   NST reassuring for gestational age  CNM consulted with Dr. Rip Harbour regarding presentation and results- MD reviewed FHR tracing and ok to discharge patient home.   Assessment and Plan   1. NST (non-stress test) reactive   2. [redacted] weeks gestation of pregnancy     -Discharge home in stable condition -Fetal movement precautions discussed -Patient advised to follow-up with OB as scheduled for prenatal care -Patient may return to MAU as needed or if her condition were to change or worsen  Wende Mott, CNM 03/17/2022, 4:16 PM

## 2022-03-17 NOTE — Discharge Instructions (Signed)

## 2022-03-20 ENCOUNTER — Encounter: Payer: Self-pay | Admitting: Cardiology

## 2022-03-20 ENCOUNTER — Ambulatory Visit (INDEPENDENT_AMBULATORY_CARE_PROVIDER_SITE_OTHER): Payer: Medicaid Other | Admitting: Cardiology

## 2022-03-20 ENCOUNTER — Telehealth: Payer: Self-pay | Admitting: Cardiology

## 2022-03-20 VITALS — BP 116/74 | HR 99 | Ht 70.0 in | Wt 316.0 lb

## 2022-03-20 DIAGNOSIS — O99419 Diseases of the circulatory system complicating pregnancy, unspecified trimester: Secondary | ICD-10-CM

## 2022-03-20 DIAGNOSIS — O0990 Supervision of high risk pregnancy, unspecified, unspecified trimester: Secondary | ICD-10-CM | POA: Diagnosis not present

## 2022-03-20 DIAGNOSIS — I340 Nonrheumatic mitral (valve) insufficiency: Secondary | ICD-10-CM

## 2022-03-20 DIAGNOSIS — I428 Other cardiomyopathies: Secondary | ICD-10-CM | POA: Diagnosis not present

## 2022-03-20 DIAGNOSIS — R0989 Other specified symptoms and signs involving the circulatory and respiratory systems: Secondary | ICD-10-CM

## 2022-03-20 DIAGNOSIS — Z79899 Other long term (current) drug therapy: Secondary | ICD-10-CM

## 2022-03-20 DIAGNOSIS — O99210 Obesity complicating pregnancy, unspecified trimester: Secondary | ICD-10-CM | POA: Diagnosis not present

## 2022-03-20 DIAGNOSIS — Z3A27 27 weeks gestation of pregnancy: Secondary | ICD-10-CM

## 2022-03-20 MED ORDER — POTASSIUM CHLORIDE ER 10 MEQ PO TBCR: 10.0000 meq | EXTENDED_RELEASE_TABLET | ORAL | 0 refills | Status: DC

## 2022-03-20 MED ORDER — FUROSEMIDE 20 MG PO TABS: 20.0000 mg | ORAL_TABLET | Freq: Every day | ORAL | 0 refills | Status: DC

## 2022-03-20 MED ORDER — FUROSEMIDE 20 MG PO TABS: 20.0000 mg | ORAL_TABLET | ORAL | 0 refills | Status: DC

## 2022-03-20 MED ORDER — POTASSIUM CHLORIDE ER 10 MEQ PO TBCR: 10.0000 meq | EXTENDED_RELEASE_TABLET | Freq: Every day | ORAL | 0 refills | Status: DC

## 2022-03-20 NOTE — Telephone Encounter (Signed)
Pt c/o medication issue:  1. Name of Medication:   furosemide (LASIX) 20 MG tablet  potassium chloride (KLOR-CON) 10 MEQ tablet   2. How are you currently taking this medication (dosage and times per day)?   3. Are you having a reaction (difficulty breathing--STAT)?   4. What is your medication issue?    Caller stated they received two sets of instructions for these medications and would like a call back to clarify.

## 2022-03-20 NOTE — Telephone Encounter (Signed)
Called pharmacy. Advised that per AVS instructions were Lasix and Potassium once weekly.   Pharmacy team verbalized understanding.

## 2022-03-20 NOTE — Patient Instructions (Signed)
Medication Instructions:  Your physician has recommended you make the following change in your medication:  START: Lasix 20 mg once weekly START: Potassium 10 mEq once weekly *If you need a refill on your cardiac medications before your next appointment, please call your pharmacy*   Lab Work: Your physician recommends that you have labs drawn today: BMET, Mag  If you have labs (blood work) drawn today and your tests are completely normal, you will receive your results only by: [place] (if you have MyChart) OR A paper copy in the mail If you have any lab test that is abnormal or we need to change your treatment, we will call you to review the results.   Testing/Procedures: None   Follow-Up: At [facility], you and your health needs are our priority.  As part of our continuing mission to provide you with exceptional heart care, we have created designated Provider Care Teams.  These Care Teams include your primary Cardiologist (physician) and Advanced Practice Providers (APPs -  Physician Assistants and Nurse Practitioners) who all work together to provide you with the care you need, when you need it.  We recommend signing up for the patient portal called "MyChart".  Sign up information is provided on this After Visit Summary.  MyChart is used to connect with patients for Virtual Visits (Telemedicine).  Patients are able to view lab/test results, encounter notes, upcoming appointments, etc.  Non-urgent messages can be sent to your provider as well.   To learn more about what you can do with MyChart, go to [email or web address].    Your next appointment:   8 week(s)  Provider:   Berniece Salines, DO [address], [place],  [address]   Other Instructions

## 2022-03-20 NOTE — Progress Notes (Signed)
Cardio-Obstetrics Clinic  New Evaluation  Date:  03/21/2022   ID:  Jamie Gates, DOB Aug 18, 2000, MRN [id number]  PCP:  Precious Gilding, DO   [facility] Providers Cardiologist:  Berniece Salines, DO  Electrophysiologist:  None       Referring MD: Precious Gilding, DO   Chief Complaint: " I am doing fine but have intermittent shortness of breath"  History of Present Illness:    Jamie Gates is a 22 y.o. female [G2P0100] who is being seen today for the evaluation of depressed ejection fraction/Dilated cardiomyopathy at the request of Precious Gilding, DO.   I saw the patient on on 12/31/2000 at that time she was pregnant. Echo showed depressed ejection 40-45%. We discussed plans. Unfortunately she did not follow up. She tells me today that she lost her last baby girl at 98 weeks. After that she stopped taking the beta blocker.   Her echo in 03/2021 showed low normal EF.  Recent echo back down mildy depressed 40-45% 11/25/2021.  I saw her on 12/26/2021 at that time she was experiencing shortness of breath - I give her cautious diuretic. Kept her on her toprol and repeated her echo.   She has had her echo.   Prior CV Studies Reviewed: The following studies were reviewed today:  11/25/2021 IMPRESSIONS   1. Left ventricular ejection fraction, by estimation, is 40 to 45%. The left ventricle has mildly decreased function. The left ventricle  demonstrates global hypokinesis. The left ventricular internal cavity size was moderately dilated. Left ventricular  diastolic parameters are indeterminate. The average left ventricular global longitudinal strain is -16.7 %. The global longitudinal strain is  abnormal.   2. Right ventricular systolic function is normal. The right ventricular size is normal.   3. The mitral valve is normal in structure. Moderate to severe mitral valve regurgitation. No evidence of mitral stenosis.   4. The aortic valve is tricuspid. Aortic valve regurgitation is not  visualized. No aortic stenosis is present.   5. The inferior vena cava is normal in size with greater than 50% respiratory variability, suggesting right atrial pressure of 3 mmHg.   FINDINGS   Left Ventricle: Left ventricular ejection fraction, by estimation, is 40 to 45%. The left ventricle has mildly decreased function. The left  ventricle demonstrates global hypokinesis. The average left ventricular global longitudinal strain is -16.7 %.  The global longitudinal strain is abnormal. The left ventricular internal cavity size was moderately dilated. There is no left ventricular  hypertrophy. Left ventricular diastolic parameters are indeterminate.   Right Ventricle: The right ventricular size is normal. Right ventricular  systolic function is normal.   Left Atrium: Left atrial size was normal in size.   Right Atrium: Right atrial size was normal in size.   Pericardium: There is no evidence of pericardial effusion.   Mitral Valve: The mitral valve is normal in structure. Moderate to severe  mitral valve regurgitation. No evidence of mitral valve stenosis. MV peak  gradient, 5.0 mmHg. The mean mitral valve gradient is 2.0 mmHg.   Tricuspid Valve: The tricuspid valve is normal in structure. Tricuspid  valve regurgitation is trivial. No evidence of tricuspid stenosis.   Aortic Valve: The aortic valve is tricuspid. Aortic valve regurgitation is  not visualized. No aortic stenosis is present.   Pulmonic Valve: The pulmonic valve was normal in structure. Pulmonic valve  regurgitation is trivial. No evidence of pulmonic stenosis.   Aorta: The aortic root is normal in size and structure.   Venous:  The inferior vena cava is normal in size with greater than 50%  respiratory variability, suggesting right atrial pressure of 3 mmHg.   IAS/Shunts: No atrial level shunt detected by color flow Doppler.      Past Medical History:  Diagnosis Date   Abnormal antibody titer (anti-Lewis  antibodies) 09/30/2020   Adjustment disorder with depressed mood 09/30/2020   Cardiomegaly 12/18/2012   still working with cardiology-esp now with pregnancy   Cardiomyopathy, nonischemic (HCC) 02/05/2021   Chlamydia 09/23/2018   Treated 09/23/2018   Gonorrhea 09/23/2018   Treated 09/23/2018   Heart failure with reduced ejection fraction (HCC) 01/05/2022   Her echo in 03/2021 showed low normal EF.  Recent echo back down mildy depressed 40-45% 11/25/2021.    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 Take after 12 weeks for prevention of preeclampsia later in pregnancy   famotidine (PEPCID) 20 MG tablet Take 1 tablet (20 mg total) by mouth 2 (two) times daily.   ferrous sulfate (FERROUSUL) 325 (65 FE) MG tablet Take 1 tablet (325 mg total) by mouth every other day.   metoprolol succinate (TOPROL XL) 25 MG 24 hr tablet Take 1 tablet (25 mg total) by mouth daily.   Prenatal 27-1 MG TABS Take 1 tablet by mouth daily.   [DISCONTINUED] furosemide (LASIX) 20 MG tablet Take 1 tablet (20 mg total) by mouth daily.   [DISCONTINUED] potassium chloride (KLOR-CON) 10 MEQ tablet Take 1 tablet (10 mEq total) by mouth daily.     Allergies:   Patient has no known allergies.   Social History   Socioeconomic History   Marital status: Single    Spouse name: Not on file   Number of children: Not on file   Years of education: Not on file   Highest education level: Not on file  Occupational History   Not on file  Tobacco Use   Smoking status:  Former    Packs/day: 0.00    Types: Cigars, Cigarettes    Quit date: 09/03/2020    Years since quitting: 1.5   Smokeless tobacco: Never  Vaping Use   Vaping Use: Never used  Substance and Sexual Activity   Alcohol use: No   Drug use: No   Sexual activity: Not Currently  Other Topics Concern   Not on file  Social History Narrative   Patient is living with her dad at this time in school still getting passing grades   Social Determinants of Health   Financial Resource Strain: Low Risk  (01/10/2021)   Overall Financial Resource Strain (CARDIA)    Difficulty of Paying Living Expenses: Not hard at all  Food Insecurity: Food Insecurity Present (01/05/2022)   Hunger Vital  Sign    Worried About Programme researcher, broadcasting/film/video in the Last Year: Sometimes true    Ran Out of Food in the Last Year: Sometimes true  Transportation Needs: No Transportation Needs (01/05/2022)   PRAPARE - Administrator, Civil Service (Medical): No    Lack of Transportation (Non-Medical): No  Physical Activity: Not on file  Stress: Not on file  Social Connections: Not on file      Family History  Problem Relation Age of Onset   Raynaud syndrome Mother    Diabetes Father    Hyperlipidemia Father    Hypertension Father    Asthma Neg Hx    Stroke Neg Hx       ROS:   Please see the history of present illness.      Review of Systems  Constitution: Negative for decreased appetite, fever and weight gain.  HENT: Negative for congestion, ear discharge, hoarse voice and sore throat.   Eyes: Negative for discharge, redness, vision loss in right eye and visual halos.  Cardiovascular: Reports shortness of breath on exertion.  Negative for chest pain,  leg swelling, orthopnea and palpitations.  Respiratory: Negative for cough, hemoptysis, shortness of breath and snoring.   Endocrine: Negative for heat intolerance and polyphagia.  Hematologic/Lymphatic: Negative for bleeding problem. Does not bruise/bleed easily.   Skin: Negative for flushing, nail changes, rash and suspicious lesions.  Musculoskeletal: Negative for arthritis, joint pain, muscle cramps, myalgias, neck pain and stiffness.  Gastrointestinal: Negative for abdominal pain, bowel incontinence, diarrhea and excessive appetite.  Genitourinary: Negative for decreased libido, genital sores and incomplete emptying.  Neurological: Negative for brief paralysis, focal weakness, headaches and loss of balance.  Psychiatric/Behavioral: Negative for altered mental status, depression and suicidal ideas.  Allergic/Immunologic: Negative for HIV exposure and persistent infections.     Labs/EKG Reviewed:    EKG:   EKG is was ordered today.     TTE 03/2022 IMPRESSIONS   1. Left ventricular ejection fraction by 3D volume is 41 %. The left  ventricle has mild to moderately decreased function. The left ventricle  demonstrates global hypokinesis. The left ventricular internal cavity size  was moderately dilated. Left  ventricular diastolic parameters were normal. The average left ventricular  global longitudinal strain is -14.2 %. The global longitudinal strain is  abnormal.   2. Right ventricular systolic function is normal. The right ventricular  size is normal. There is normal pulmonary artery systolic pressure.   3. The mitral valve is normal in structure. Moderate mitral valve  regurgitation. No evidence of mitral stenosis.   4. The aortic valve is normal in structure. Aortic valve regurgitation is  not visualized. No aortic stenosis is present.   5. The inferior vena cava is normal in size with greater than 50%  respiratory variability, suggesting right atrial pressure of 3 mmHg.   Comparison(s): No significant change from prior study. Prior images  reviewed side by side.   Recent Labs: 01/05/2022: ALT 8; BNP 58.1; BUN 9; Creatinine, Ser 0.60; Potassium 4.2; Sodium 138; TSH 0.782 03/09/2022: Hemoglobin 10.7; Platelets 203   Recent Lipid  Panel No results found for: "CHOL", "TRIG", "HDL", "CHOLHDL", "LDLCALC", "LDLDIRECT"  Physical Exam:    VS:  BP 116/74   Pulse 99   Ht 5\' 10"  (1.778 m)   Wt (!) 143.3 kg   LMP 09/04/2021 (Exact Date)   SpO2 99%   BMI 45.34 kg/m     Wt Readings from Last 3 Encounters:  03/20/22 (!) 143.3 kg  03/09/22 (!) 143.3 kg  02/05/22 (!) 143.2 kg     GEN:  Well nourished, well developed in no acute distress HEENT: Normal NECK: No JVD; No carotid bruits LYMPHATICS: No lymphadenopathy CARDIAC: RRR, no murmurs, rubs, gallops RESPIRATORY:  Clear to auscultation without rales, wheezing or rhonchi  ABDOMEN: Soft, non-tender, non-distended MUSCULOSKELETAL:  No edema; No deformity  SKIN: Warm and dry NEUROLOGIC:  Alert and oriented x 3 PSYCHIATRIC:  Normal affect    Risk Assessment/Risk Calculators:                  ASSESSMENT & PLAN:    Depressed ejection fraction Heart failure with reduced ejection fraction Obesity affecting pregnancy Moderate Mitral Regurgitation   Her echo 03/10/2022 EF still moderately depressed - 3D 41%. MR slightly improved was moderate to severe now moderate.  Given his young patient suspect that this etiology may be nonischemic but will limited to any evaluation to understand her cardiomyopathy.  Treatment is paramount here for this patient.  She is no longer on her toprol xl will restart.    Hopefully postpartum we can consider treatment repeat echocardiogram as well as probably cardiac MRI.  She having some shortness of breath - will give lasix 20mg  once weekly, and KCL 75meq once weekly - will get blood work today.  I have educated patient about warning signs understand if she gets worsening shortness of breath to notify my office.  I encouraged the patient to keep her weight gain less than 20 pounds during her pregnancy.  Follow-up in 8 weeks or sooner if needed.  Patient Instructions  Medication Instructions:  Your physician has recommended  you make the following change in your medication:  START: Lasix 20 mg once weekly START: Potassium 10 mEq once weekly *If you need a refill on your cardiac medications before your next appointment, please call your pharmacy*   Lab Work: Your physician recommends that you have labs drawn today: BMET, Mag  If you have labs (blood work) drawn today and your tests are completely normal, you will receive your results only by: [place] (if you have MyChart) OR A paper copy in the mail If you have any lab test that is abnormal or we need to change your treatment, we will call you to review the results.   Testing/Procedures: None   Follow-Up: At [facility], you and your health needs are our priority.  As part of our continuing mission to provide you with exceptional heart care, we have created designated Provider Care Teams.  These Care Teams include your primary Cardiologist (physician) and Advanced Practice Providers (APPs -  Physician Assistants and Nurse Practitioners) who all work together to provide you with the care you need, when you need it.  We recommend signing up for the patient portal called "MyChart".  Sign up information is provided on this After Visit Summary.  MyChart is used to connect with patients for Virtual Visits (Telemedicine).  Patients are able to view lab/test results, encounter notes, upcoming appointments, etc.  Non-urgent messages can be sent to your provider as well.   To learn more about what you can do with MyChart, go to [email or web address].    Your next appointment:   8 week(s)  Provider:   Berniece Salines, DO [address], [place], [place] [address]   Other Instructions     Dispo:  No follow-ups on file.   Medication Adjustments/Labs and Tests Ordered: Current medicines are reviewed at  length with the patient today.  Concerns regarding medicines are outlined above.  Tests Ordered: Orders Placed This Encounter  Procedures    Basic metabolic panel   Magnesium   Medication Changes: Meds ordered this encounter  Medications   DISCONTD: furosemide (LASIX) 20 MG tablet    Sig: Take 1 tablet (20 mg total) by mouth daily.    Dispense:  30 tablet    Refill:  0   DISCONTD: potassium chloride (KLOR-CON) 10 MEQ tablet    Sig: Take 1 tablet (10 mEq total) by mouth daily.    Dispense:  30 tablet    Refill:  0   potassium chloride (KLOR-CON) 10 MEQ tablet    Sig: Take 1 tablet (10 mEq total) by mouth once a week.    Dispense:  30 tablet    Refill:  0   furosemide (LASIX) 20 MG tablet    Sig: Take 1 tablet (20 mg total) by mouth once a week.    Dispense:  30 tablet    Refill:  0

## 2022-03-23 ENCOUNTER — Ambulatory Visit (INDEPENDENT_AMBULATORY_CARE_PROVIDER_SITE_OTHER): Payer: Medicaid Other | Admitting: Obstetrics and Gynecology

## 2022-03-23 ENCOUNTER — Other Ambulatory Visit: Payer: Self-pay

## 2022-03-23 VITALS — BP 99/76 | HR 93 | Wt 318.9 lb

## 2022-03-23 DIAGNOSIS — I428 Other cardiomyopathies: Secondary | ICD-10-CM

## 2022-03-23 DIAGNOSIS — O26893 Other specified pregnancy related conditions, third trimester: Secondary | ICD-10-CM

## 2022-03-23 DIAGNOSIS — Z6791 Unspecified blood type, Rh negative: Secondary | ICD-10-CM

## 2022-03-23 DIAGNOSIS — B009 Herpesviral infection, unspecified: Secondary | ICD-10-CM

## 2022-03-23 DIAGNOSIS — I5020 Unspecified systolic (congestive) heart failure: Secondary | ICD-10-CM

## 2022-03-23 DIAGNOSIS — R760 Raised antibody titer: Secondary | ICD-10-CM

## 2022-03-23 DIAGNOSIS — O99213 Obesity complicating pregnancy, third trimester: Secondary | ICD-10-CM

## 2022-03-23 DIAGNOSIS — O0993 Supervision of high risk pregnancy, unspecified, third trimester: Secondary | ICD-10-CM

## 2022-03-23 DIAGNOSIS — O09893 Supervision of other high risk pregnancies, third trimester: Secondary | ICD-10-CM

## 2022-03-23 DIAGNOSIS — O365930 Maternal care for other known or suspected poor fetal growth, third trimester, not applicable or unspecified: Secondary | ICD-10-CM

## 2022-03-23 DIAGNOSIS — Z8759 Personal history of other complications of pregnancy, childbirth and the puerperium: Secondary | ICD-10-CM

## 2022-03-23 DIAGNOSIS — Z3182 Encounter for Rh incompatibility status: Secondary | ICD-10-CM | POA: Diagnosis not present

## 2022-03-23 DIAGNOSIS — O365990 Maternal care for other known or suspected poor fetal growth, unspecified trimester, not applicable or unspecified: Secondary | ICD-10-CM | POA: Insufficient documentation

## 2022-03-23 DIAGNOSIS — O26899 Other specified pregnancy related conditions, unspecified trimester: Secondary | ICD-10-CM

## 2022-03-23 DIAGNOSIS — Z3A28 28 weeks gestation of pregnancy: Secondary | ICD-10-CM

## 2022-03-23 DIAGNOSIS — O0990 Supervision of high risk pregnancy, unspecified, unspecified trimester: Secondary | ICD-10-CM

## 2022-03-23 DIAGNOSIS — O99413 Diseases of the circulatory system complicating pregnancy, third trimester: Secondary | ICD-10-CM

## 2022-03-23 DIAGNOSIS — Z6841 Body Mass Index (BMI) 40.0 and over, adult: Secondary | ICD-10-CM | POA: Insufficient documentation

## 2022-03-23 DIAGNOSIS — I340 Nonrheumatic mitral (valve) insufficiency: Secondary | ICD-10-CM

## 2022-03-23 DIAGNOSIS — O09899 Supervision of other high risk pregnancies, unspecified trimester: Secondary | ICD-10-CM

## 2022-03-23 MED ORDER — RHO D IMMUNE GLOBULIN 1500 UNIT/2ML IJ SOSY
300.0000 ug | PREFILLED_SYRINGE | Freq: Once | INTRAMUSCULAR | Status: AC
  Administered 2022-03-23: 300 ug via INTRAMUSCULAR

## 2022-03-23 NOTE — Progress Notes (Addendum)
   PRENATAL VISIT NOTE  Subjective:  Jamie Gates is a 22 y.o. G2P0100 at [redacted]w[redacted]d being seen today for ongoing prenatal care.  She is currently monitored for the following issues for this high-risk pregnancy and has Maternal morbid obesity, antepartum ([place]); Cardiomegaly; HSV infection; Adjustment disorder with depressed mood; Abnormal antibody titer (anti-Lewis antibodies); Rh negative state in antepartum period; Cardiomyopathy, nonischemic ([place]); History of IUFD at 65 weeks; Mitral regurgitation of mother during pregnancy; History of maternal cardiomyopathy, currently pregnant; Supervision of high risk pregnancy, antepartum; Heart failure with reduced ejection fraction ([place]); Atypical squamous cell changes of undetermined significance (ASCUS) with negative high risk human papilloma virus (HPV) pap 01/05/2022; Anemia of pregnancy; BMI 45.0-49.9, adult ([place]); and Fetal growth restriction antepartum on their problem list.  Patient doing well with no acute concerns today. She reports no complaints.  Contractions: Not present. Vag. Bleeding: None.  Movement: (!) Decreased (already spoke with provider at mfm appt). Denies leaking of fluid. Pt is noting 6-10 movements per hour, they are just less than previous.  Red chart patient, very complicated.  Reviewed MFM and Cardiology notes.  The following portions of the patient's history were reviewed and updated as appropriate: allergies, current medications, past family history, past medical history, past social history, past surgical history and problem list. Problem list updated.  Objective:   Vitals:   03/23/22 1322  BP: 99/76  Pulse: 93  Weight: (!) 318 lb 14.4 oz (144.7 kg)    Fetal Status: Fetal Heart Rate (bpm): 146 Fundal Height: 28 cm Movement: (!) Decreased (already spoke with provider at mfm appt)     General:  Alert, oriented and cooperative. Patient is in no acute distress.  Skin: Skin is warm and dry. No rash noted.   Cardiovascular:  Normal heart rate noted  Respiratory: Normal respiratory effort, no problems with respiration noted  Abdomen: Soft, gravid, appropriate for gestational age.  Pain/Pressure: Absent     Pelvic: Cervical exam deferred        Extremities: Normal range of motion.  Edema: Trace  Mental Status:  Normal mood and affect. Normal behavior. Normal judgment and thought content.   Assessment and Plan:  Pregnancy: G2P0100 at [redacted]w[redacted]d  1. [redacted] weeks gestation of pregnancy   2. Mitral regurgitation of mother during pregnancy Followed by cardiology  3. Heart failure with reduced ejection fraction (HCC) EF currently 40-45%, pt continue with metoprolol and lasix  4. Cardiomyopathy, nonischemic [facility]) Next cardiology appt in March.  5. Supervision of high risk pregnancy, antepartum Continue routine prenatal care  6. Rh negative state in antepartum period Rhogam given today - rho (d) immune globulin (RHIG/RHOPHYLAC) injection 300 mcg  7. Maternal morbid obesity, antepartum ([place])   8. HSV infection Prophylaxis at 35 weeks  9. History of IUFD at 45 weeks Pt is getting weekly dopplers  10. History of maternal cardiomyopathy, currently pregnant See above  11. Abnormal antibody titer (anti-Lewis antibodies)   12. BMI 45.0-49.9, adult ([place])   13. Fetal growth restriction antepartum EFW at 31% but AC at 8%, MFM is following  Preterm labor symptoms and general obstetric precautions including but not limited to vaginal bleeding, contractions, leaking of fluid and fetal movement were reviewed in detail with the patient.  Please refer to After Visit Summary for other counseling recommendations.   Return in about 2 years (around 03/23/2024) for [facility], in person.   Lynnda Shields, MD Faculty Attending Center for [facility]

## 2022-03-24 ENCOUNTER — Ambulatory Visit: Payer: Medicaid Other

## 2022-03-24 ENCOUNTER — Ambulatory Visit: Payer: Medicaid Other | Admitting: *Deleted

## 2022-03-24 ENCOUNTER — Ambulatory Visit: Payer: Medicaid Other | Attending: Maternal & Fetal Medicine

## 2022-03-24 VITALS — BP 114/61 | HR 83

## 2022-03-24 DIAGNOSIS — O99213 Obesity complicating pregnancy, third trimester: Secondary | ICD-10-CM | POA: Diagnosis not present

## 2022-03-24 DIAGNOSIS — Z8759 Personal history of other complications of pregnancy, childbirth and the puerperium: Secondary | ICD-10-CM

## 2022-03-24 DIAGNOSIS — O365930 Maternal care for other known or suspected poor fetal growth, third trimester, not applicable or unspecified: Secondary | ICD-10-CM | POA: Insufficient documentation

## 2022-03-24 DIAGNOSIS — N83209 Unspecified ovarian cyst, unspecified side: Secondary | ICD-10-CM

## 2022-03-24 DIAGNOSIS — E669 Obesity, unspecified: Secondary | ICD-10-CM

## 2022-03-24 DIAGNOSIS — O3483 Maternal care for other abnormalities of pelvic organs, third trimester: Secondary | ICD-10-CM | POA: Diagnosis not present

## 2022-03-24 DIAGNOSIS — I429 Cardiomyopathy, unspecified: Secondary | ICD-10-CM

## 2022-03-24 DIAGNOSIS — O0990 Supervision of high risk pregnancy, unspecified, unspecified trimester: Secondary | ICD-10-CM

## 2022-03-24 DIAGNOSIS — O365990 Maternal care for other known or suspected poor fetal growth, unspecified trimester, not applicable or unspecified: Secondary | ICD-10-CM | POA: Diagnosis not present

## 2022-03-24 DIAGNOSIS — O09293 Supervision of pregnancy with other poor reproductive or obstetric history, third trimester: Secondary | ICD-10-CM | POA: Diagnosis not present

## 2022-03-24 DIAGNOSIS — O99413 Diseases of the circulatory system complicating pregnancy, third trimester: Secondary | ICD-10-CM

## 2022-03-24 DIAGNOSIS — Z3A28 28 weeks gestation of pregnancy: Secondary | ICD-10-CM | POA: Diagnosis not present

## 2022-03-24 DIAGNOSIS — O360130 Maternal care for anti-D [Rh] antibodies, third trimester, not applicable or unspecified: Secondary | ICD-10-CM

## 2022-03-24 DIAGNOSIS — D649 Anemia, unspecified: Secondary | ICD-10-CM | POA: Diagnosis not present

## 2022-03-24 DIAGNOSIS — O99013 Anemia complicating pregnancy, third trimester: Secondary | ICD-10-CM

## 2022-03-24 NOTE — Procedures (Addendum)
Jamie Gates 2000-05-07 [redacted]w[redacted]d  Fetus A Non-Stress Test Interpretation for 03/24/22  Indication: IUGR and Obesity  Fetal Heart Rate A Mode: External Baseline Rate (A): 135 bpm Variability: Moderate Accelerations: 10 x 10 Decelerations: None Multiple birth?: No  Uterine Activity Mode: Palpation, Toco Contraction Frequency (min): None  Interpretation (Fetal Testing) Nonstress Test Interpretation: Reactive Overall Impression: Reassuring for gestational age Comments: Dr. Epimenio Sarin reviewed tracing.

## 2022-03-26 ENCOUNTER — Other Ambulatory Visit: Payer: Self-pay

## 2022-03-26 DIAGNOSIS — R0989 Other specified symptoms and signs involving the circulatory and respiratory systems: Secondary | ICD-10-CM

## 2022-03-26 NOTE — Progress Notes (Signed)
Order for Echo placed.

## 2022-03-30 ENCOUNTER — Encounter (HOSPITAL_COMMUNITY): Payer: Self-pay | Admitting: Obstetrics & Gynecology

## 2022-03-30 ENCOUNTER — Inpatient Hospital Stay (HOSPITAL_COMMUNITY)
Admission: AD | Admit: 2022-03-30 | Discharge: 2022-03-30 | Disposition: A | Discharge status: Home / Self Care | Payer: Medicaid Other | Attending: Obstetrics & Gynecology | Admitting: Obstetrics & Gynecology

## 2022-03-30 DIAGNOSIS — O368120 Decreased fetal movements, second trimester, not applicable or unspecified: Secondary | ICD-10-CM

## 2022-03-30 DIAGNOSIS — O09292 Supervision of pregnancy with other poor reproductive or obstetric history, second trimester: Secondary | ICD-10-CM | POA: Diagnosis not present

## 2022-03-30 DIAGNOSIS — Z3A29 29 weeks gestation of pregnancy: Secondary | ICD-10-CM

## 2022-03-30 DIAGNOSIS — Z7982 Long term (current) use of aspirin: Secondary | ICD-10-CM | POA: Insufficient documentation

## 2022-03-30 DIAGNOSIS — O99412 Diseases of the circulatory system complicating pregnancy, second trimester: Secondary | ICD-10-CM | POA: Insufficient documentation

## 2022-03-30 DIAGNOSIS — Z3A20 20 weeks gestation of pregnancy: Secondary | ICD-10-CM | POA: Diagnosis not present

## 2022-03-30 DIAGNOSIS — I5022 Chronic systolic (congestive) heart failure: Secondary | ICD-10-CM | POA: Insufficient documentation

## 2022-03-30 DIAGNOSIS — O99212 Obesity complicating pregnancy, second trimester: Secondary | ICD-10-CM | POA: Diagnosis not present

## 2022-03-30 DIAGNOSIS — Z3689 Encounter for other specified antenatal screening: Secondary | ICD-10-CM

## 2022-03-30 DIAGNOSIS — Z79899 Other long term (current) drug therapy: Secondary | ICD-10-CM | POA: Diagnosis not present

## 2022-03-30 DIAGNOSIS — Z3493 Encounter for supervision of normal pregnancy, unspecified, third trimester: Secondary | ICD-10-CM

## 2022-03-30 DIAGNOSIS — O0990 Supervision of high risk pregnancy, unspecified, unspecified trimester: Secondary | ICD-10-CM

## 2022-03-30 HISTORY — DX: Gestational (pregnancy-induced) hypertension without significant proteinuria, unspecified trimester: O13.9

## 2022-03-30 NOTE — MAU Note (Signed)
Jamie Gates is a 22 y.o. at [redacted]w[redacted]d here in MAU reporting: hadn't felt her baby move in a couple hours and it is scaring her.  Tried to drink some water, but she just came in. No pain or bleeding/LOF. Very anxious, hx of stillbirth last year.  Pt tearful in triage.   Onset of complaint: 2hrs Pain score: none Vitals:   03/30/22 1851  BP: 134/77  Pulse: 91  Resp: 20  Temp: 98.9 F (37.2 C)  SpO2: 100%     FHT:144 (listened for a couple minutes) Lab orders placed from triage:

## 2022-03-30 NOTE — MAU Provider Note (Signed)
History     CSN: [id number]  Arrival date and time: 03/30/22 5573   Event Date/Time   First Provider Initiated Contact with Patient 03/30/22 1947      Chief Complaint  Patient presents with   Decreased Fetal Movement   HPI 22 y/o F at [redacted]w[redacted]d with a history of cardiomyopathy HFmrEF, FGR, and IUFD in previous pregnancy at 41 weeks presenting for concern of DFM. Pt notes she felt like her baby was moving less starting this morning and she was really concerned given her history of an IUFD so she decided to come here. Pt denies any other sx such as vaginal bleeding, abd pain, or leakage of fluid. Denies fver, chills, or n/v/d.   Past Medical History:  Diagnosis Date   Abnormal antibody titer (anti-Lewis antibodies) 09/30/2020   Adjustment disorder with depressed mood 09/30/2020   Cardiomegaly 12/18/2012   still working with cardiology-esp now with pregnancy   Cardiomyopathy, nonischemic ([place]) 02/05/2021   Chlamydia 09/23/2018   Treated 09/23/2018   Gonorrhea 09/23/2018   Treated 09/23/2018   Heart failure with reduced ejection fraction ([place]) 01/05/2022   Her echo in 03/2021 showed low normal EF.  Recent echo back down mildy depressed 40-45% 11/25/2021.    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   Migraines    Mitral regurgitation    Morbid obesity (HCC)    Ovarian cyst    Pregnancy induced hypertension     Past Surgical History:  Procedure Laterality Date   TYMPANOSTOMY TUBE PLACEMENT      Family History  Problem Relation Age of Onset   Raynaud syndrome Mother    Diabetes Father    Hyperlipidemia Father    Hypertension Father    Asthma Neg Hx    Stroke Neg Hx     Social History   Tobacco Use   Smoking status: Former    Packs/day: 0.00    Types: Cigars, Cigarettes    Quit date: 09/03/2020    Years since quitting: 1.5   Smokeless tobacco: Never  Vaping Use   Vaping Use: Never used  Substance Use Topics   Alcohol use: No   Drug use: No    Allergies: No Known  Allergies  Medications Prior to Admission  Medication Sig Dispense Refill Last Dose   aspirin EC 81 MG tablet Take 2 tablets (162 mg total) by mouth daily. Take after 12 weeks for prevention of preeclampsia later in pregnancy 300 tablet 2 03/30/2022   famotidine (PEPCID) 20 MG tablet Take 1 tablet (20 mg total) by mouth 2 (two) times daily. 60 tablet 3 03/30/2022   ferrous sulfate (FERROUSUL) 325 (65 FE) MG tablet Take 1 tablet (325 mg total) by mouth every other day. 30 tablet 3 03/30/2022   furosemide (LASIX) 20 MG tablet Take 1 tablet (20 mg total) by mouth once a week. 30 tablet 0 03/30/2022   metoprolol succinate (TOPROL XL) 25 MG 24 hr tablet Take 1 tablet (25 mg total) by mouth daily. 90 tablet 1 03/30/2022   potassium chloride (KLOR-CON) 10 MEQ tablet Take 1 tablet (10 mEq total) by mouth once a week. 30 tablet 0 03/30/2022   Prenatal 27-1 MG TABS Take 1 tablet by mouth daily. 30 tablet 10 03/30/2022    Review of Systems Physical Exam   Blood pressure 116/71, pulse 90, temperature 98.9 F (37.2 C), temperature source Oral, resp. rate 19, height 5\' 10"  (1.778 m), weight (!) 144.3 kg, last menstrual period 09/04/2021, SpO2 99 %.  Physical Exam Constitutional:  Appearance: She is obese.  HENT:     Head: Normocephalic and atraumatic.     Mouth/Throat:     Mouth: Mucous membranes are moist.  Cardiovascular:     Rate and Rhythm: Normal rate and regular rhythm.  Pulmonary:     Effort: Pulmonary effort is normal.     Breath sounds: Normal breath sounds.  Abdominal:     General: There is no distension.     Comments: Gravid abd  Skin:    General: Skin is warm and dry.  Neurological:     Mental Status: She is alert.  Psychiatric:     Comments: tearful     MAU Course  Procedures   Assessment and Plan  22 y/o F at [redacted]w[redacted]d with a history of cardiomyopathy HFmrEF, FGR, and IUFD in previous pregnancy at 60 weeks presenting for concern of DFM. Because of the history of FGR kept pt  on monitor for approximately an hour and a reactive strip was noted. Reassured pt, discussed kick counts, and advised pt to obtain her scheduled [place] for FGR tomorrow as scheduled.   Abram Sander 03/30/2022, 8:08 PM

## 2022-03-31 ENCOUNTER — Ambulatory Visit: Payer: Medicaid Other | Attending: Maternal & Fetal Medicine

## 2022-03-31 ENCOUNTER — Ambulatory Visit (HOSPITAL_BASED_OUTPATIENT_CLINIC_OR_DEPARTMENT_OTHER): Payer: Medicaid Other | Admitting: *Deleted

## 2022-03-31 ENCOUNTER — Ambulatory Visit: Payer: Medicaid Other | Admitting: *Deleted

## 2022-03-31 ENCOUNTER — Encounter: Payer: Self-pay | Admitting: *Deleted

## 2022-03-31 VITALS — BP 119/64 | HR 75

## 2022-03-31 DIAGNOSIS — E669 Obesity, unspecified: Secondary | ICD-10-CM | POA: Diagnosis not present

## 2022-03-31 DIAGNOSIS — Z3A29 29 weeks gestation of pregnancy: Secondary | ICD-10-CM

## 2022-03-31 DIAGNOSIS — O3483 Maternal care for other abnormalities of pelvic organs, third trimester: Secondary | ICD-10-CM

## 2022-03-31 DIAGNOSIS — O99413 Diseases of the circulatory system complicating pregnancy, third trimester: Secondary | ICD-10-CM | POA: Diagnosis not present

## 2022-03-31 DIAGNOSIS — D649 Anemia, unspecified: Secondary | ICD-10-CM | POA: Diagnosis not present

## 2022-03-31 DIAGNOSIS — O09293 Supervision of pregnancy with other poor reproductive or obstetric history, third trimester: Secondary | ICD-10-CM

## 2022-03-31 DIAGNOSIS — O99013 Anemia complicating pregnancy, third trimester: Secondary | ICD-10-CM | POA: Diagnosis not present

## 2022-03-31 DIAGNOSIS — N83209 Unspecified ovarian cyst, unspecified side: Secondary | ICD-10-CM

## 2022-03-31 DIAGNOSIS — O365990 Maternal care for other known or suspected poor fetal growth, unspecified trimester, not applicable or unspecified: Secondary | ICD-10-CM | POA: Insufficient documentation

## 2022-03-31 DIAGNOSIS — O365930 Maternal care for other known or suspected poor fetal growth, third trimester, not applicable or unspecified: Secondary | ICD-10-CM | POA: Diagnosis not present

## 2022-03-31 DIAGNOSIS — O0990 Supervision of high risk pregnancy, unspecified, unspecified trimester: Secondary | ICD-10-CM | POA: Diagnosis not present

## 2022-03-31 DIAGNOSIS — O99213 Obesity complicating pregnancy, third trimester: Secondary | ICD-10-CM

## 2022-03-31 DIAGNOSIS — O360130 Maternal care for anti-D [Rh] antibodies, third trimester, not applicable or unspecified: Secondary | ICD-10-CM | POA: Diagnosis not present

## 2022-03-31 DIAGNOSIS — I429 Cardiomyopathy, unspecified: Secondary | ICD-10-CM | POA: Diagnosis not present

## 2022-03-31 NOTE — Procedures (Signed)
Jamie Gates 09/28/2000 [redacted]w[redacted]d  Fetus A Non-Stress Test Interpretation for 03/31/22  Indication: IUGR  Fetal Heart Rate A Mode: External Baseline Rate (A): 135 bpm Variability: Moderate Accelerations: 15 x 15 Decelerations: None Multiple birth?: No  Uterine Activity Mode: Palpation, Toco Contraction Frequency (min): None  Interpretation (Fetal Testing) Nonstress Test Interpretation: Reactive Comments: Dr. Gertie Exon reviewed tracing.

## 2022-04-06 ENCOUNTER — Other Ambulatory Visit: Payer: Self-pay

## 2022-04-06 ENCOUNTER — Ambulatory Visit (INDEPENDENT_AMBULATORY_CARE_PROVIDER_SITE_OTHER): Payer: Medicaid Other | Admitting: Obstetrics and Gynecology

## 2022-04-06 VITALS — BP 107/72 | HR 98 | Wt 318.1 lb

## 2022-04-06 DIAGNOSIS — R760 Raised antibody titer: Secondary | ICD-10-CM

## 2022-04-06 DIAGNOSIS — O99213 Obesity complicating pregnancy, third trimester: Secondary | ICD-10-CM

## 2022-04-06 DIAGNOSIS — Z3A30 30 weeks gestation of pregnancy: Secondary | ICD-10-CM

## 2022-04-06 DIAGNOSIS — O26893 Other specified pregnancy related conditions, third trimester: Secondary | ICD-10-CM

## 2022-04-06 DIAGNOSIS — Z6841 Body Mass Index (BMI) 40.0 and over, adult: Secondary | ICD-10-CM

## 2022-04-06 DIAGNOSIS — N83201 Unspecified ovarian cyst, right side: Secondary | ICD-10-CM

## 2022-04-06 DIAGNOSIS — B009 Herpesviral infection, unspecified: Secondary | ICD-10-CM

## 2022-04-06 DIAGNOSIS — O26899 Other specified pregnancy related conditions, unspecified trimester: Secondary | ICD-10-CM

## 2022-04-06 DIAGNOSIS — Z6791 Unspecified blood type, Rh negative: Secondary | ICD-10-CM

## 2022-04-06 DIAGNOSIS — R87610 Atypical squamous cells of undetermined significance on cytologic smear of cervix (ASC-US): Secondary | ICD-10-CM

## 2022-04-06 DIAGNOSIS — I5020 Unspecified systolic (congestive) heart failure: Secondary | ICD-10-CM

## 2022-04-06 DIAGNOSIS — I340 Nonrheumatic mitral (valve) insufficiency: Secondary | ICD-10-CM

## 2022-04-06 DIAGNOSIS — O99413 Diseases of the circulatory system complicating pregnancy, third trimester: Secondary | ICD-10-CM

## 2022-04-06 DIAGNOSIS — O99013 Anemia complicating pregnancy, third trimester: Secondary | ICD-10-CM

## 2022-04-06 DIAGNOSIS — Z8759 Personal history of other complications of pregnancy, childbirth and the puerperium: Secondary | ICD-10-CM

## 2022-04-06 MED ORDER — MONTELUKAST SODIUM 10 MG PO TABS: 10.0000 mg | ORAL_TABLET | Freq: Every day | ORAL | 1 refills | Status: DC

## 2022-04-06 MED ORDER — VALACYCLOVIR HCL 500 MG PO TABS: 500.0000 mg | ORAL_TABLET | Freq: Every day | ORAL | 1 refills | Status: DC

## 2022-04-06 NOTE — Progress Notes (Addendum)
PRENATAL VISIT NOTE  Subjective:  Jamie Gates is a 22 y.o. G2P0100 at [redacted]w[redacted]d being seen today for ongoing prenatal care.  She is currently monitored for the following issues for this high-risk pregnancy and has Maternal morbid obesity, antepartum ([place]); Cardiomegaly; Right ovarian cyst; HSV infection; Adjustment disorder with depressed mood; Abnormal antibody titer (anti-Lewis antibodies); Rh negative state in antepartum period; Cardiomyopathy, nonischemic ([place]); History of IUFD at 97 weeks; Mitral regurgitation of mother during pregnancy; History of maternal cardiomyopathy, currently pregnant; Supervision of high risk pregnancy, antepartum; Heart failure with reduced ejection fraction ([place]); Atypical squamous cell changes of undetermined significance (ASCUS) with negative high risk human papilloma virus (HPV) pap 01/05/2022; Anemia of pregnancy; BMI 45.0-49.9, adult ([place]); and Fetal growth restriction antepartum on their problem list.  Patient reports no complaints.  Contractions: Not present. Vag. Bleeding: None.  Movement: Present. Denies leaking of fluid.   The following portions of the patient's history were reviewed and updated as appropriate: allergies, current medications, past family history, past medical history, past social history, past surgical history and problem list.   Objective:   Vitals:   04/06/22 1132  BP: 107/72  Pulse: 98  Weight: (!) 318 lb 1.6 oz (144.3 kg)    Fetal Status: Fetal Heart Rate (bpm): 162   Movement: Present     General:  Alert, oriented and cooperative. Patient is in no acute distress.  Skin: Skin is warm and dry. No rash noted.   Cardiovascular: Normal heart rate noted  Respiratory: Normal respiratory effort, no problems with respiration noted  Abdomen: Soft, gravid, appropriate for gestational age.  Pain/Pressure: Absent     Pelvic: Cervical exam deferred        Extremities: Normal range of motion.  Edema: Trace  Mental Status: Normal mood and  affect. Normal behavior. Normal judgment and thought content.   Assessment and Plan:  Pregnancy: G2P0100 at [redacted]w[redacted]d 1. Right ovarian cyst 12x8x7cm on 1/30 ultrasound. Patient asymptomatic. Continue to follow  2. Mitral regurgitation of mother during pregnancy Followed by [facility] cardiology, last visit 1/19. 1/9 echo stable to slightly improved with stable EF at 40-45% but with MR slightly improved with now moderate MR from moderate to severe.  Patient confirms she is taking the daily toprol and the weekly lasix.  Follow up repeat 3/5 echo  3. Heart failure with reduced ejection fraction ([place]) See above  4. Rh negative state in antepartum period S/p rhogam on 1/22  5. Maternal morbid obesity, antepartum ([place]) Weight stable from last visit  6. BMI 45.0-49.9, adult ([place])  7. HSV infection Last outbreak in December. Precautions given and recommend starting prophylaxis now which she is amenable to  8. History of IUFD at 45 weeks F/u weekly dopplers tomorrow.  1/30: 28%, 1395gm, ac 8% with normal UA dopplers  9. Anemia during pregnancy in third trimester    Latest Ref Rng & Units 03/09/2022    8:37 AM 01/05/2022    4:42 PM 03/14/2021   10:07 AM  CBC  WBC 3.4 - 10.8 x10E3/uL 9.6  9.5  9.9   Hemoglobin 11.1 - 15.9 g/dL 10.7  11.6  13.1   Hematocrit 34.0 - 46.6 % 31.7  34.6  38.1   Platelets 150 - 450 x10E3/uL 203  203  200    10. Atypical squamous cell changes of undetermined significance (ASCUS) with negative high risk human papilloma virus (HPV) pap 01/05/2022  11. Abnormal antibody titer (anti-Lewis antibodies) F/u L&D admit antibody screen  12. Pregnancy Confirms  on low dose ASA Follow up more re: birth control next visit  Preterm labor symptoms and general obstetric precautions including but not limited to vaginal bleeding, contractions, leaking of fluid and fetal movement were reviewed in detail with the patient. Please refer to After Visit Summary for other counseling  recommendations.   Return in about 2 weeks (around 04/20/2022) for in person, high risk ob, md visit.  Future Appointments  Date Time Provider [place]  04/07/2022  8:30 AM [facility] NURSE [facility]  04/07/2022  8:45 AM WMC-MFC US5 WMC-MFCUS [facility]  04/07/2022  9:45 AM WMC-MFC NST W[facility]-M[facility]  04/20/2022  3:55 PM Radene Gunning, MD [facility]  05/05/2022  8:35 AM MC-CV CH ECHO 5 [facility]ECHO [facility]  05/15/2022  1:40 PM Tobb, Godfrey Pick, DO CVD-WMC None    Aletha Halim, MD

## 2022-04-07 ENCOUNTER — Encounter: Payer: Self-pay | Admitting: *Deleted

## 2022-04-07 ENCOUNTER — Ambulatory Visit: Payer: Medicaid Other | Attending: Maternal & Fetal Medicine

## 2022-04-07 ENCOUNTER — Ambulatory Visit (HOSPITAL_BASED_OUTPATIENT_CLINIC_OR_DEPARTMENT_OTHER): Payer: Medicaid Other

## 2022-04-07 ENCOUNTER — Ambulatory Visit: Payer: Medicaid Other | Admitting: *Deleted

## 2022-04-07 ENCOUNTER — Other Ambulatory Visit: Payer: Self-pay | Admitting: *Deleted

## 2022-04-07 VITALS — BP 106/64 | HR 83

## 2022-04-07 DIAGNOSIS — O99013 Anemia complicating pregnancy, third trimester: Secondary | ICD-10-CM

## 2022-04-07 DIAGNOSIS — I429 Cardiomyopathy, unspecified: Secondary | ICD-10-CM

## 2022-04-07 DIAGNOSIS — N83209 Unspecified ovarian cyst, unspecified side: Secondary | ICD-10-CM

## 2022-04-07 DIAGNOSIS — O365930 Maternal care for other known or suspected poor fetal growth, third trimester, not applicable or unspecified: Secondary | ICD-10-CM | POA: Diagnosis not present

## 2022-04-07 DIAGNOSIS — O360130 Maternal care for anti-D [Rh] antibodies, third trimester, not applicable or unspecified: Secondary | ICD-10-CM | POA: Diagnosis not present

## 2022-04-07 DIAGNOSIS — O99413 Diseases of the circulatory system complicating pregnancy, third trimester: Secondary | ICD-10-CM

## 2022-04-07 DIAGNOSIS — D649 Anemia, unspecified: Secondary | ICD-10-CM | POA: Diagnosis not present

## 2022-04-07 DIAGNOSIS — O0990 Supervision of high risk pregnancy, unspecified, unspecified trimester: Secondary | ICD-10-CM

## 2022-04-07 DIAGNOSIS — Z8759 Personal history of other complications of pregnancy, childbirth and the puerperium: Secondary | ICD-10-CM | POA: Diagnosis not present

## 2022-04-07 DIAGNOSIS — O09293 Supervision of pregnancy with other poor reproductive or obstetric history, third trimester: Secondary | ICD-10-CM | POA: Insufficient documentation

## 2022-04-07 DIAGNOSIS — Z3A30 30 weeks gestation of pregnancy: Secondary | ICD-10-CM | POA: Diagnosis not present

## 2022-04-07 DIAGNOSIS — O365990 Maternal care for other known or suspected poor fetal growth, unspecified trimester, not applicable or unspecified: Secondary | ICD-10-CM

## 2022-04-07 DIAGNOSIS — O3483 Maternal care for other abnormalities of pelvic organs, third trimester: Secondary | ICD-10-CM | POA: Diagnosis not present

## 2022-04-07 DIAGNOSIS — O99213 Obesity complicating pregnancy, third trimester: Secondary | ICD-10-CM

## 2022-04-07 DIAGNOSIS — E669 Obesity, unspecified: Secondary | ICD-10-CM

## 2022-04-07 NOTE — Procedures (Signed)
Jamie Gates 12-05-2000 [redacted]w[redacted]d  Fetus A Non-Stress Test Interpretation for 04/07/22  Indication:  Fetal Growth Restriction   Fetal Heart Rate A Mode: External Baseline Rate (A): 120 bpm Variability: Moderate Accelerations: 15 x 15 Decelerations: None Multiple birth?: No  Uterine Activity Mode: Toco Contraction Frequency (min): No UC's Resting Tone Palpated: Relaxed  Interpretation (Fetal Testing) Nonstress Test Interpretation: Reactive Overall Impression: Reassuring for gestational age Comments: Tracing reviewed by Dr. Annamaria Boots

## 2022-04-10 ENCOUNTER — Encounter (HOSPITAL_COMMUNITY): Payer: Self-pay | Admitting: Obstetrics and Gynecology

## 2022-04-10 ENCOUNTER — Inpatient Hospital Stay (HOSPITAL_COMMUNITY)
Admission: AD | Admit: 2022-04-10 | Discharge: 2022-04-10 | Disposition: A | Discharge status: Home / Self Care | Payer: Medicaid Other | Attending: Obstetrics and Gynecology | Admitting: Obstetrics and Gynecology

## 2022-04-10 DIAGNOSIS — O26893 Other specified pregnancy related conditions, third trimester: Secondary | ICD-10-CM | POA: Insufficient documentation

## 2022-04-10 DIAGNOSIS — R102 Pelvic and perineal pain: Secondary | ICD-10-CM | POA: Diagnosis not present

## 2022-04-10 DIAGNOSIS — N949 Unspecified condition associated with female genital organs and menstrual cycle: Secondary | ICD-10-CM

## 2022-04-10 DIAGNOSIS — O0993 Supervision of high risk pregnancy, unspecified, third trimester: Secondary | ICD-10-CM | POA: Insufficient documentation

## 2022-04-10 DIAGNOSIS — Z3689 Encounter for other specified antenatal screening: Secondary | ICD-10-CM

## 2022-04-10 DIAGNOSIS — Z3A31 31 weeks gestation of pregnancy: Secondary | ICD-10-CM | POA: Diagnosis not present

## 2022-04-10 DIAGNOSIS — O0990 Supervision of high risk pregnancy, unspecified, unspecified trimester: Secondary | ICD-10-CM

## 2022-04-10 LAB — URINALYSIS, ROUTINE W REFLEX MICROSCOPIC
Bilirubin Urine: NEGATIVE
Glucose, UA: NEGATIVE mg/dL
Hgb urine dipstick: NEGATIVE
Ketones, ur: 5 mg/dL — AB
Nitrite: NEGATIVE
Protein, ur: 30 mg/dL — AB
Specific Gravity, Urine: 1.031 — ABNORMAL HIGH (ref 1.005–1.030)
pH: 5 (ref 5.0–8.0)

## 2022-04-10 NOTE — MAU Provider Note (Signed)
Event Date/Time   First Provider Initiated Contact with Patient 04/10/22 0107      S Ms. Jamie Gates is a 22 y.o. G90P0100 pregnant female at 3w1dwho presents to MAU today with complaint of lower right sided abdominal pain worst with movement that started when she got up today and has continued throughout the day. Denies menstrual type cramping, vaginal bleeding or LOF, endorses normal fetal movement. No other physical complaints.   [facility] Prenatal records reviewed.  Pertinent items noted in HPI and remainder of comprehensive ROS otherwise negative.   O BP 135/71   Pulse 97   Temp 98.6 F (37 C)   Resp 17   Ht 5' 11"$  (1.803 m)   Wt (!) 322 lb (146.1 kg)   LMP 09/04/2021 (Exact Date)   SpO2 98%   BMI 44.91 kg/m  Physical Exam Vitals and nursing note reviewed.  Constitutional:      Appearance: Normal appearance.  HENT:     Head: Normocephalic and atraumatic.  Eyes:     Pupils: Pupils are equal, round, and reactive to light.  Cardiovascular:     Rate and Rhythm: Normal rate and regular rhythm.  Pulmonary:     Effort: Pulmonary effort is normal.  Abdominal:     General: There is no distension.     Palpations: Abdomen is soft.     Tenderness: There is no abdominal tenderness.  Musculoskeletal:        General: Normal range of motion.  Skin:    General: Skin is warm and dry.     Capillary Refill: Capillary refill takes less than 2 seconds.  Neurological:     Mental Status: She is alert and oriented to person, place, and time.  Psychiatric:        Mood and Affect: Mood normal.        Behavior: Behavior normal.        Thought Content: Thought content normal.        Judgment: Judgment normal.    Fetal Tracing: reactive Baseline: 125 Variability: moderate Accelerations: 15x15 Decelerations: none Toco: relaxed   MDM: Low  MAU Course: Patient asleep prior to provider exam. Stated this pain has been intermittent only with  movement today. Discussed round ligaments, massage and stretches to relieve as well as warning signs that should bring her back to MAU for evaluation.  A Supervision of high risk pregnancy, antepartum  Round ligament pain - Plan: Discharge patient  [redacted] weeks gestation of pregnancy - Plan: Discharge patient  NST (non-stress test) reactive   P Discharge from MAU in stable condition with return precautions [facility]  Allergies as of 04/10/2022   No Known Allergies      Medication List     TAKE these medications    aspirin EC 81 MG tablet Take 2 tablets (162 mg total) by mouth daily. Take after 12 weeks for prevention of preeclampsia later in pregnancy   famotidine 20 MG tablet Commonly known as: PEPCID Take 1 tablet (20 mg total) by mouth 2 (two) times daily.   ferrous sulfate 325 (65 FE) MG tablet Commonly known as: FerrouSul Take 1 tablet (325 mg total) by mouth every other day.   furosemide 20 MG tablet Commonly known as: LASIX Take 1 tablet (20 mg total) by mouth once a week.   metoprolol succinate 25 MG 24 hr tablet Commonly known as: Toprol XL Take 1 tablet (  25 mg total) by mouth daily.   montelukast 10 MG tablet Commonly known as: Singulair Take 1 tablet (10 mg total) by mouth at bedtime.   potassium chloride 10 MEQ tablet Commonly known as: KLOR-CON Take 1 tablet (10 mEq total) by mouth once a week.   Prenatal 27-1 MG Tabs Take 1 tablet by mouth daily.   valACYclovir 500 MG tablet Commonly known as: VALTREX Take 1 tablet (500 mg total) by mouth daily. Can increase to twice a day for 5 days in the event of a recurrence        Gabriel Carina, CNM 04/10/2022 1:22 AM

## 2022-04-10 NOTE — Discharge Instructions (Signed)

## 2022-04-10 NOTE — MAU Note (Signed)
.  Jamie Gates is a 22 y.o. at [redacted]w[redacted]d here in MAU reporting vaginal pain since Thurs am when she woke up. Pain is worse with movement like moving in bed or walking. Denies VB or LOF and reports good FM  Onset of complaint: Thurs am Pain score: 4 Vitals:   04/10/22 0012 04/10/22 0017  BP:  123/66  Pulse: 90   Resp: 17   Temp: 98.6 F (37 C)   SpO2: 100%      FHT:137 Lab orders placed from triage:  u/a

## 2022-04-14 ENCOUNTER — Encounter: Payer: Self-pay | Admitting: *Deleted

## 2022-04-14 ENCOUNTER — Ambulatory Visit: Payer: Medicaid Other | Attending: Obstetrics

## 2022-04-14 ENCOUNTER — Other Ambulatory Visit: Payer: Self-pay

## 2022-04-14 ENCOUNTER — Ambulatory Visit: Payer: Medicaid Other | Admitting: *Deleted

## 2022-04-14 VITALS — BP 110/74 | HR 85

## 2022-04-14 DIAGNOSIS — O365930 Maternal care for other known or suspected poor fetal growth, third trimester, not applicable or unspecified: Secondary | ICD-10-CM | POA: Insufficient documentation

## 2022-04-14 DIAGNOSIS — Z3A31 31 weeks gestation of pregnancy: Secondary | ICD-10-CM

## 2022-04-14 DIAGNOSIS — D649 Anemia, unspecified: Secondary | ICD-10-CM

## 2022-04-14 DIAGNOSIS — O0990 Supervision of high risk pregnancy, unspecified, unspecified trimester: Secondary | ICD-10-CM | POA: Insufficient documentation

## 2022-04-14 DIAGNOSIS — O99213 Obesity complicating pregnancy, third trimester: Secondary | ICD-10-CM | POA: Diagnosis not present

## 2022-04-14 DIAGNOSIS — O3483 Maternal care for other abnormalities of pelvic organs, third trimester: Secondary | ICD-10-CM | POA: Diagnosis not present

## 2022-04-14 DIAGNOSIS — O99413 Diseases of the circulatory system complicating pregnancy, third trimester: Secondary | ICD-10-CM | POA: Diagnosis not present

## 2022-04-14 DIAGNOSIS — I429 Cardiomyopathy, unspecified: Secondary | ICD-10-CM

## 2022-04-14 DIAGNOSIS — E669 Obesity, unspecified: Secondary | ICD-10-CM | POA: Diagnosis not present

## 2022-04-14 DIAGNOSIS — O360130 Maternal care for anti-D [Rh] antibodies, third trimester, not applicable or unspecified: Secondary | ICD-10-CM

## 2022-04-14 DIAGNOSIS — O99013 Anemia complicating pregnancy, third trimester: Secondary | ICD-10-CM | POA: Diagnosis not present

## 2022-04-14 DIAGNOSIS — O09293 Supervision of pregnancy with other poor reproductive or obstetric history, third trimester: Secondary | ICD-10-CM | POA: Diagnosis not present

## 2022-04-14 DIAGNOSIS — N83291 Other ovarian cyst, right side: Secondary | ICD-10-CM

## 2022-04-20 ENCOUNTER — Other Ambulatory Visit: Payer: Self-pay | Admitting: Cardiology

## 2022-04-20 ENCOUNTER — Encounter: Payer: Self-pay | Admitting: Obstetrics and Gynecology

## 2022-04-20 ENCOUNTER — Other Ambulatory Visit: Payer: Self-pay

## 2022-04-20 ENCOUNTER — Ambulatory Visit (INDEPENDENT_AMBULATORY_CARE_PROVIDER_SITE_OTHER): Payer: Medicaid Other | Admitting: Obstetrics and Gynecology

## 2022-04-20 VITALS — BP 114/78 | HR 92 | Wt 318.0 lb

## 2022-04-20 DIAGNOSIS — O26893 Other specified pregnancy related conditions, third trimester: Secondary | ICD-10-CM

## 2022-04-20 DIAGNOSIS — O99413 Diseases of the circulatory system complicating pregnancy, third trimester: Secondary | ICD-10-CM

## 2022-04-20 DIAGNOSIS — I5020 Unspecified systolic (congestive) heart failure: Secondary | ICD-10-CM

## 2022-04-20 DIAGNOSIS — O99013 Anemia complicating pregnancy, third trimester: Secondary | ICD-10-CM | POA: Diagnosis not present

## 2022-04-20 DIAGNOSIS — Z23 Encounter for immunization: Secondary | ICD-10-CM

## 2022-04-20 DIAGNOSIS — I340 Nonrheumatic mitral (valve) insufficiency: Secondary | ICD-10-CM

## 2022-04-20 DIAGNOSIS — Z8759 Personal history of other complications of pregnancy, childbirth and the puerperium: Secondary | ICD-10-CM

## 2022-04-20 DIAGNOSIS — Z6791 Unspecified blood type, Rh negative: Secondary | ICD-10-CM

## 2022-04-20 DIAGNOSIS — N83201 Unspecified ovarian cyst, right side: Secondary | ICD-10-CM

## 2022-04-20 DIAGNOSIS — O0993 Supervision of high risk pregnancy, unspecified, third trimester: Secondary | ICD-10-CM

## 2022-04-20 DIAGNOSIS — B009 Herpesviral infection, unspecified: Secondary | ICD-10-CM

## 2022-04-20 DIAGNOSIS — O0990 Supervision of high risk pregnancy, unspecified, unspecified trimester: Secondary | ICD-10-CM

## 2022-04-20 DIAGNOSIS — Z3A32 32 weeks gestation of pregnancy: Secondary | ICD-10-CM

## 2022-04-20 LAB — CBC
Hematocrit: 31.5 % — ABNORMAL LOW (ref 34.0–46.6)
Hemoglobin: 10.7 g/dL — ABNORMAL LOW (ref 11.1–15.9)
MCH: 28.5 pg (ref 26.6–33.0)
MCHC: 34 g/dL (ref 31.5–35.7)
MCV: 84 fL (ref 79–97)
Platelets: 247 10*3/uL (ref 150–450)
RBC: 3.75 x10E6/uL — ABNORMAL LOW (ref 3.77–5.28)
RDW: 13.1 % (ref 11.7–15.4)
WBC: 11.5 10*3/uL — ABNORMAL HIGH (ref 3.4–10.8)

## 2022-04-20 NOTE — Telephone Encounter (Signed)
Rx refill sent to pharmacy. 

## 2022-04-20 NOTE — Addendum Note (Signed)
Addended byMariane Baumgarten on: 04/20/2022 05:00 PM   Modules accepted: Orders

## 2022-04-20 NOTE — Progress Notes (Signed)
PRENATAL VISIT NOTE  Subjective:  Jamie Gates is a 22 y.o. G2P0100 at 25w4dbeing seen today for ongoing prenatal care.  She is currently monitored for the following issues for this high-risk pregnancy and has Maternal morbid obesity, antepartum ([place]; Cardiomegaly; Right ovarian cyst; HSV infection; Adjustment disorder with depressed mood; Abnormal antibody titer (anti-Lewis antibodies); Rh negative state in antepartum period; Cardiomyopathy, nonischemic ([place]; History of IUFD at 329 weeks Mitral regurgitation of mother during pregnancy; History of maternal cardiomyopathy, currently pregnant; Supervision of high risk pregnancy, antepartum; Heart failure with reduced ejection fraction (HE[place]; Atypical squamous cell changes of undetermined significance (ASCUS) with negative high risk human papilloma virus (HPV) pap 01/05/2022; Anemia of pregnancy; and BMI 45.0-49.9, adult ([place] on their problem list.  Patient reports  normal aches and pains of pregnancy .  Contractions: Not present. Vag. Bleeding: None.  Movement: Present. Denies leaking of fluid.   The following portions of the patient's history were reviewed and updated as appropriate: allergies, current medications, past family history, past medical history, past social history, past surgical history and problem list.   Objective:   Vitals:   04/20/22 1622  BP: 114/78  Pulse: 92  Weight: (!) 318 lb (144.2 kg)    Fetal Status: Fetal Heart Rate (bpm): 140   Movement: Present     General:  Alert, oriented and cooperative. Patient is in no acute distress.  Skin: Skin is warm and dry. No rash noted.   Cardiovascular: Normal heart rate noted  Respiratory: Normal respiratory effort, no problems with respiration noted  Abdomen: Soft, gravid, appropriate for gestational age.  Pain/Pressure: Present     Pelvic: Cervical exam deferred        Extremities: Normal range of motion.  Edema: None  Mental Status: Normal mood and affect. Normal  behavior. Normal judgment and thought content.   Assessment and Plan:  Pregnancy: G2P0100 at [redacted]w[redacted]d. Right ovarian cyst 12x8x7cm on 1/30 ultrasound. Patient asymptomatic. Continue to follow   2. Mitral regurgitation of mother during pregnancy [facility] 1/[facility]. 1/9 echo stable to slightly improved with stable EF at 40-45% but with MR slightly improved with now moderate MR from moderate to severe.  She is taking daily toprol and weekly lasix.  Follow up repeat 3/5 echo  3. Heart failure with reduced ejection fraction (HCLorainSee above  4. Anemia during pregnancy in third trimester Continue PO iron. Last check was hgb 10.7.     Latest Ref Rng & Units 03/09/2022    8:37 AM 01/05/2022    4:42 PM 03/14/2021   10:07 AM  CBC  WBC 3.4 - 10.8 x10E3/uL 9.6  9.5  9.9   Hemoglobin 11.1 - 15.9 g/dL 10.7  11.6  13.1   Hematocrit 34.0 - 46.6 % 31.7  34.6  38.1   Platelets 150 - 450 x10E3/uL 203  203  200   - Recheck today   5. History of IUFD at 3245eeks Continue weekly testing given history. Next growth is tomorrow. 1/30: AC is 8%, but overall growth is 28%ile. Normal AFI and normal dopplers.   6. HSV infection Continue prophylaxis until delivery  7. Rh negative state in antepartum period S/p rhogam  8. Supervision of high risk pregnancy, antepartum Offered and recommended tdap - pt accepts.    Preterm labor symptoms and general obstetric precautions including but not limited to vaginal bleeding, contractions, leaking of fluid and fetal movement were reviewed in detail with the patient. Please refer  to After Visit Summary for other counseling recommendations.   Return in about 2 weeks (around 05/04/2022) for OB VISIT, MD only.  Future Appointments  Date Time Provider [place]  04/21/2022  3:30 PM [facility] NURSE [facility]  04/21/2022  3:45 PM WMC-MFC US6 W[facility]  04/28/2022  3:15 PM WMC-MFC NURSE W[facility]-MFC [facility]  04/28/2022  3:30 PM WMC-MFC US2  WMC-MFCUS [facility]  05/05/2022  8:35 AM MC-CV CH ECHO 5 [facility]ECHO [facility]  05/05/2022 11:15 AM WMC-MFC NURSE W[facility]-MFC [facility]  05/05/2022 11:30 AM WMC-MFC US3 WMC-MFCUS [facility]  05/12/2022  3:15 PM WMC-MFC NURSE [facility]-MFC [facility]  05/12/2022  3:30 PM WMC-MFC US3 W[facility]  05/15/2022  1:40 PM Tobb, Godfrey Pick, DO CVD-WMC None    Radene Gunning, MD

## 2022-04-21 ENCOUNTER — Ambulatory Visit: Payer: Medicaid Other

## 2022-04-21 ENCOUNTER — Encounter: Payer: Self-pay | Admitting: Family Medicine

## 2022-04-21 ENCOUNTER — Ambulatory Visit: Payer: Medicaid Other | Attending: Obstetrics

## 2022-04-21 VITALS — BP 106/57 | HR 87

## 2022-04-21 DIAGNOSIS — O365930 Maternal care for other known or suspected poor fetal growth, third trimester, not applicable or unspecified: Secondary | ICD-10-CM

## 2022-04-21 DIAGNOSIS — E669 Obesity, unspecified: Secondary | ICD-10-CM | POA: Diagnosis not present

## 2022-04-21 DIAGNOSIS — O99413 Diseases of the circulatory system complicating pregnancy, third trimester: Secondary | ICD-10-CM | POA: Diagnosis not present

## 2022-04-21 DIAGNOSIS — O360130 Maternal care for anti-D [Rh] antibodies, third trimester, not applicable or unspecified: Secondary | ICD-10-CM | POA: Diagnosis not present

## 2022-04-21 DIAGNOSIS — O09293 Supervision of pregnancy with other poor reproductive or obstetric history, third trimester: Secondary | ICD-10-CM | POA: Diagnosis not present

## 2022-04-21 DIAGNOSIS — N83291 Other ovarian cyst, right side: Secondary | ICD-10-CM | POA: Diagnosis not present

## 2022-04-21 DIAGNOSIS — I429 Cardiomyopathy, unspecified: Secondary | ICD-10-CM | POA: Diagnosis not present

## 2022-04-21 DIAGNOSIS — D649 Anemia, unspecified: Secondary | ICD-10-CM | POA: Diagnosis not present

## 2022-04-21 DIAGNOSIS — Z3A32 32 weeks gestation of pregnancy: Secondary | ICD-10-CM | POA: Diagnosis not present

## 2022-04-21 DIAGNOSIS — O0990 Supervision of high risk pregnancy, unspecified, unspecified trimester: Secondary | ICD-10-CM

## 2022-04-21 DIAGNOSIS — O99013 Anemia complicating pregnancy, third trimester: Secondary | ICD-10-CM | POA: Diagnosis not present

## 2022-04-21 DIAGNOSIS — Z7189 Other specified counseling: Secondary | ICD-10-CM | POA: Insufficient documentation

## 2022-04-21 DIAGNOSIS — O99213 Obesity complicating pregnancy, third trimester: Secondary | ICD-10-CM

## 2022-04-21 DIAGNOSIS — O3483 Maternal care for other abnormalities of pelvic organs, third trimester: Secondary | ICD-10-CM | POA: Diagnosis not present

## 2022-04-22 ENCOUNTER — Other Ambulatory Visit: Payer: Self-pay | Admitting: *Deleted

## 2022-04-22 DIAGNOSIS — O99213 Obesity complicating pregnancy, third trimester: Secondary | ICD-10-CM

## 2022-04-22 DIAGNOSIS — Z8759 Personal history of other complications of pregnancy, childbirth and the puerperium: Secondary | ICD-10-CM

## 2022-04-28 ENCOUNTER — Ambulatory Visit: Payer: Medicaid Other

## 2022-04-28 ENCOUNTER — Ambulatory Visit: Payer: Medicaid Other | Attending: Obstetrics

## 2022-04-28 VITALS — BP 123/65 | HR 99

## 2022-04-28 DIAGNOSIS — O365930 Maternal care for other known or suspected poor fetal growth, third trimester, not applicable or unspecified: Secondary | ICD-10-CM | POA: Insufficient documentation

## 2022-04-28 DIAGNOSIS — O09293 Supervision of pregnancy with other poor reproductive or obstetric history, third trimester: Secondary | ICD-10-CM

## 2022-04-28 DIAGNOSIS — I429 Cardiomyopathy, unspecified: Secondary | ICD-10-CM | POA: Diagnosis not present

## 2022-04-28 DIAGNOSIS — N83291 Other ovarian cyst, right side: Secondary | ICD-10-CM

## 2022-04-28 DIAGNOSIS — Z3A33 33 weeks gestation of pregnancy: Secondary | ICD-10-CM | POA: Diagnosis not present

## 2022-04-28 DIAGNOSIS — O0990 Supervision of high risk pregnancy, unspecified, unspecified trimester: Secondary | ICD-10-CM | POA: Insufficient documentation

## 2022-04-28 DIAGNOSIS — O99013 Anemia complicating pregnancy, third trimester: Secondary | ICD-10-CM | POA: Diagnosis not present

## 2022-04-28 DIAGNOSIS — E669 Obesity, unspecified: Secondary | ICD-10-CM

## 2022-04-28 DIAGNOSIS — D649 Anemia, unspecified: Secondary | ICD-10-CM | POA: Diagnosis not present

## 2022-04-28 DIAGNOSIS — O3483 Maternal care for other abnormalities of pelvic organs, third trimester: Secondary | ICD-10-CM

## 2022-04-28 DIAGNOSIS — O360130 Maternal care for anti-D [Rh] antibodies, third trimester, not applicable or unspecified: Secondary | ICD-10-CM | POA: Diagnosis not present

## 2022-04-28 DIAGNOSIS — O99413 Diseases of the circulatory system complicating pregnancy, third trimester: Secondary | ICD-10-CM

## 2022-04-28 DIAGNOSIS — O99213 Obesity complicating pregnancy, third trimester: Secondary | ICD-10-CM

## 2022-05-02 NOTE — Progress Notes (Unsigned)
PRENATAL VISIT NOTE  Subjective:  Jamie Gates is a 22 y.o. G2P0100 at 65w6dbeing seen today for ongoing prenatal care.  She is currently monitored for the following issues for this high-risk pregnancy and has Maternal morbid obesity, antepartum ([place]Phil[place]; Cardiomegaly; Right ovarian cyst; HSV infection; Adjustment disorder with depressed mood; Abnormal antibody titer (anti-Lewis antibodies); Rh negative state in antepartum period; Cardiomyopathy, nonischemic (HL[place]; History of IUFD at 381 weeks Mitral regurgitation of mother during pregnancy; History of maternal cardiomyopathy, currently pregnant; Supervision of high risk pregnancy, antepartum; Heart failure with reduced ejection fraction (HG[place]; Atypical squamous cell changes of undetermined significance (ASCUS) with negative high risk human papilloma virus (HPV) pap 01/05/2022; Anemia of pregnancy; BMI 45.0-49.9, adult (HMeb[place]; and Red Chart Patient on their problem list.  Patient reports no complaints - no SOB, heart racing or CP.  Contractions: Not present. Vag. Bleeding: None.  Movement: Present. Denies leaking of fluid.   The following portions of the patient's history were reviewed and updated as appropriate: allergies, current medications, past family history, past medical history, past social history, past surgical history and problem list.   Objective:   Vitals:   05/06/22 1401  BP: 124/77  Pulse: (!) 108  Weight: (!) 321 lb 12.8 oz (146 kg)    Fetal Status: Fetal Heart Rate (bpm): 123   Movement: Present     General:  Alert, oriented and cooperative. Patient is in no acute distress.  Skin: Skin is warm and dry. No rash noted.   Cardiovascular: Normal heart rate noted  Respiratory: Normal respiratory effort, no problems with respiration noted  Abdomen: Soft, gravid, appropriate for gestational age.  Pain/Pressure: Absent     Pelvic: Cervical exam performed in the presence of a chaperone        Extremities: Normal range of motion.   Edema: None  Mental Status: Normal mood and affect. Normal behavior. Normal judgment and thought content.   Assessment and Plan:  Pregnancy: G2P0100 at 382w6d. Supervision of high risk pregnancy, antepartum Nexplanon postpartum GBS swab today in anticipate of 37w IOL based on MFM recommendation (h/o IUFD and her history of cardiomyopathy). Reviewed date options between 3/21-3/27. Reviewed would do a goal of Thursday MN or Monday in case of multi-day induction. She would prefer 3/25. Scheduled for this date but she is aware and understands if any changes in antenatal testing or issues with her heart, we would move this up.   2. Heart failure with reduced ejection fraction (HCBarrackvilleSee below  3. 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 1[facility]. 1/9 echo stable to slightly improved with stable EF at 40-45% but with MR slightly improved with now moderate MR from moderate to severe. Repeat Echo on 3/5 showed 40-45% which is stable for her. Has next appt with Dr. ToHarriet Massonn 3/15.  She is taking daily toprol and weekly lasix.  Updated Dr. WoLanetta Inchith anesthesia on timing of delivery for this patient. She would like her in Room 18 for IOL.   4. Right ovarian cyst 12x8x7cm on 1/30 ultrasound. Patient asymptomatic. Continue to follow and reevaluate after delivery.   5. Anemia during pregnancy in third trimester HgB is stable on PO iron at 10.7. Will continue present management.   6. History of IUFD at 3289 weeksas in the setting of FGR. Normal growth thus far - 61%ile, 2178g, normal AC 35%ile. Getting weekly monitoring with MFM.   7. HSV infection Continue prophy  8. Rh negative state  in antepartum period S/p Rhogam  Preterm labor symptoms and general obstetric precautions including but not limited to vaginal bleeding, contractions, leaking of fluid and fetal movement were reviewed in detail with the patient. Please refer to After Visit Summary for other  counseling recommendations.   Return in about 1 week (around 05/13/2022) for HROB VISIT, MD only.  Future Appointments  Date Time Provider [place]  05/12/2022  3:15 PM [facility] NURSE [facility]  05/12/2022  3:30 PM WMC-MFC US3 WMC-MFCUS [facility]  05/15/2022  1:40 PM Tobb, Kardie, DO CVD-WMC None  05/19/2022  2:45 PM WMC-MFC NURSE WMC-MFC [facility]  05/19/2022  3:00 PM WMC-MFC US1 WMC-MFCUS [place]    Radene Gunning, MD

## 2022-05-05 ENCOUNTER — Ambulatory Visit: Payer: Medicaid Other | Attending: Cardiology

## 2022-05-05 ENCOUNTER — Ambulatory Visit: Payer: Medicaid Other | Admitting: *Deleted

## 2022-05-05 ENCOUNTER — Encounter: Payer: Self-pay | Admitting: *Deleted

## 2022-05-05 ENCOUNTER — Ambulatory Visit (HOSPITAL_BASED_OUTPATIENT_CLINIC_OR_DEPARTMENT_OTHER): Payer: Medicaid Other

## 2022-05-05 VITALS — BP 113/64 | HR 83

## 2022-05-05 DIAGNOSIS — O09293 Supervision of pregnancy with other poor reproductive or obstetric history, third trimester: Secondary | ICD-10-CM

## 2022-05-05 DIAGNOSIS — O0990 Supervision of high risk pregnancy, unspecified, unspecified trimester: Secondary | ICD-10-CM | POA: Diagnosis not present

## 2022-05-05 DIAGNOSIS — O99213 Obesity complicating pregnancy, third trimester: Secondary | ICD-10-CM | POA: Diagnosis not present

## 2022-05-05 DIAGNOSIS — O99413 Diseases of the circulatory system complicating pregnancy, third trimester: Secondary | ICD-10-CM

## 2022-05-05 DIAGNOSIS — Z3A34 34 weeks gestation of pregnancy: Secondary | ICD-10-CM | POA: Diagnosis not present

## 2022-05-05 DIAGNOSIS — O365930 Maternal care for other known or suspected poor fetal growth, third trimester, not applicable or unspecified: Secondary | ICD-10-CM | POA: Insufficient documentation

## 2022-05-05 DIAGNOSIS — O360130 Maternal care for anti-D [Rh] antibodies, third trimester, not applicable or unspecified: Secondary | ICD-10-CM | POA: Diagnosis not present

## 2022-05-05 DIAGNOSIS — E669 Obesity, unspecified: Secondary | ICD-10-CM | POA: Diagnosis not present

## 2022-05-05 DIAGNOSIS — D649 Anemia, unspecified: Secondary | ICD-10-CM | POA: Diagnosis not present

## 2022-05-05 DIAGNOSIS — O99013 Anemia complicating pregnancy, third trimester: Secondary | ICD-10-CM | POA: Diagnosis not present

## 2022-05-05 DIAGNOSIS — O3483 Maternal care for other abnormalities of pelvic organs, third trimester: Secondary | ICD-10-CM

## 2022-05-05 DIAGNOSIS — I429 Cardiomyopathy, unspecified: Secondary | ICD-10-CM

## 2022-05-05 DIAGNOSIS — R0989 Other specified symptoms and signs involving the circulatory and respiratory systems: Secondary | ICD-10-CM | POA: Diagnosis not present

## 2022-05-05 DIAGNOSIS — N83291 Other ovarian cyst, right side: Secondary | ICD-10-CM

## 2022-05-05 LAB — ECHOCARDIOGRAM COMPLETE
Area-P 1/2: 5.01 cm2
MV M vel: 5.25 m/s
MV Peak grad: 110.3 mmHg
S' Lateral: 4.55 cm

## 2022-05-06 ENCOUNTER — Other Ambulatory Visit: Payer: Self-pay

## 2022-05-06 ENCOUNTER — Other Ambulatory Visit (HOSPITAL_COMMUNITY)
Admission: RE | Admit: 2022-05-06 | Discharge: 2022-05-06 | Disposition: A | Discharge status: Home / Self Care | Payer: Medicaid Other | Source: Ambulatory Visit | Attending: Obstetrics and Gynecology | Admitting: Obstetrics and Gynecology

## 2022-05-06 ENCOUNTER — Encounter: Payer: Self-pay | Admitting: Obstetrics and Gynecology

## 2022-05-06 ENCOUNTER — Ambulatory Visit (INDEPENDENT_AMBULATORY_CARE_PROVIDER_SITE_OTHER): Payer: Medicaid Other | Admitting: Obstetrics and Gynecology

## 2022-05-06 VITALS — BP 124/77 | HR 108 | Wt 321.8 lb

## 2022-05-06 DIAGNOSIS — N83201 Unspecified ovarian cyst, right side: Secondary | ICD-10-CM

## 2022-05-06 DIAGNOSIS — Z8759 Personal history of other complications of pregnancy, childbirth and the puerperium: Secondary | ICD-10-CM

## 2022-05-06 DIAGNOSIS — O0990 Supervision of high risk pregnancy, unspecified, unspecified trimester: Secondary | ICD-10-CM | POA: Insufficient documentation

## 2022-05-06 DIAGNOSIS — I340 Nonrheumatic mitral (valve) insufficiency: Secondary | ICD-10-CM

## 2022-05-06 DIAGNOSIS — B009 Herpesviral infection, unspecified: Secondary | ICD-10-CM

## 2022-05-06 DIAGNOSIS — O99013 Anemia complicating pregnancy, third trimester: Secondary | ICD-10-CM

## 2022-05-06 DIAGNOSIS — O0993 Supervision of high risk pregnancy, unspecified, third trimester: Secondary | ICD-10-CM

## 2022-05-06 DIAGNOSIS — Z6791 Unspecified blood type, Rh negative: Secondary | ICD-10-CM

## 2022-05-06 DIAGNOSIS — O26899 Other specified pregnancy related conditions, unspecified trimester: Secondary | ICD-10-CM

## 2022-05-06 DIAGNOSIS — Z3A34 34 weeks gestation of pregnancy: Secondary | ICD-10-CM

## 2022-05-06 DIAGNOSIS — I5020 Unspecified systolic (congestive) heart failure: Secondary | ICD-10-CM

## 2022-05-06 DIAGNOSIS — O99413 Diseases of the circulatory system complicating pregnancy, third trimester: Secondary | ICD-10-CM

## 2022-05-06 NOTE — Addendum Note (Signed)
Addended by: Michel Harrow on: 05/06/2022 03:16 PM   Modules accepted: Orders

## 2022-05-06 NOTE — Addendum Note (Signed)
Addended by: Forrestine Him A on: 05/06/2022 05:36 PM   Modules accepted: Orders

## 2022-05-07 ENCOUNTER — Other Ambulatory Visit: Payer: Self-pay | Admitting: Obstetrics and Gynecology

## 2022-05-07 DIAGNOSIS — O0990 Supervision of high risk pregnancy, unspecified, unspecified trimester: Secondary | ICD-10-CM

## 2022-05-07 NOTE — Progress Notes (Signed)
GBS pending at time of orders.

## 2022-05-08 LAB — CERVICOVAGINAL ANCILLARY ONLY
Chlamydia: NEGATIVE
Comment: NEGATIVE
Comment: NORMAL
Neisseria Gonorrhea: NEGATIVE

## 2022-05-10 LAB — CULTURE, BETA STREP (GROUP B ONLY): Strep Gp B Culture: POSITIVE — AB

## 2022-05-12 ENCOUNTER — Ambulatory Visit: Payer: Medicaid Other | Attending: Obstetrics

## 2022-05-12 ENCOUNTER — Encounter: Payer: Self-pay | Admitting: *Deleted

## 2022-05-12 ENCOUNTER — Ambulatory Visit: Payer: Medicaid Other | Admitting: *Deleted

## 2022-05-12 VITALS — BP 109/56 | HR 77

## 2022-05-12 DIAGNOSIS — N83291 Other ovarian cyst, right side: Secondary | ICD-10-CM

## 2022-05-12 DIAGNOSIS — O99013 Anemia complicating pregnancy, third trimester: Secondary | ICD-10-CM

## 2022-05-12 DIAGNOSIS — O360130 Maternal care for anti-D [Rh] antibodies, third trimester, not applicable or unspecified: Secondary | ICD-10-CM

## 2022-05-12 DIAGNOSIS — O0990 Supervision of high risk pregnancy, unspecified, unspecified trimester: Secondary | ICD-10-CM | POA: Diagnosis not present

## 2022-05-12 DIAGNOSIS — O99413 Diseases of the circulatory system complicating pregnancy, third trimester: Secondary | ICD-10-CM

## 2022-05-12 DIAGNOSIS — O3483 Maternal care for other abnormalities of pelvic organs, third trimester: Secondary | ICD-10-CM

## 2022-05-12 DIAGNOSIS — Z3A35 35 weeks gestation of pregnancy: Secondary | ICD-10-CM

## 2022-05-12 DIAGNOSIS — O365930 Maternal care for other known or suspected poor fetal growth, third trimester, not applicable or unspecified: Secondary | ICD-10-CM

## 2022-05-12 DIAGNOSIS — O99213 Obesity complicating pregnancy, third trimester: Secondary | ICD-10-CM

## 2022-05-12 DIAGNOSIS — I429 Cardiomyopathy, unspecified: Secondary | ICD-10-CM

## 2022-05-12 DIAGNOSIS — E669 Obesity, unspecified: Secondary | ICD-10-CM

## 2022-05-12 DIAGNOSIS — D649 Anemia, unspecified: Secondary | ICD-10-CM

## 2022-05-12 DIAGNOSIS — O09293 Supervision of pregnancy with other poor reproductive or obstetric history, third trimester: Secondary | ICD-10-CM | POA: Diagnosis not present

## 2022-05-14 ENCOUNTER — Ambulatory Visit (INDEPENDENT_AMBULATORY_CARE_PROVIDER_SITE_OTHER): Payer: Medicaid Other | Admitting: Obstetrics and Gynecology

## 2022-05-14 ENCOUNTER — Other Ambulatory Visit: Payer: Self-pay

## 2022-05-14 VITALS — BP 114/75 | HR 105 | Wt 321.1 lb

## 2022-05-14 DIAGNOSIS — O99820 Streptococcus B carrier state complicating pregnancy: Secondary | ICD-10-CM | POA: Insufficient documentation

## 2022-05-14 DIAGNOSIS — Z3A36 36 weeks gestation of pregnancy: Secondary | ICD-10-CM

## 2022-05-14 NOTE — Progress Notes (Signed)
PRENATAL VISIT NOTE  Subjective:  Jamie Gates is a 22 y.o. G2P0100 at 48w0dbeing seen today for ongoing prenatal care.  She is currently monitored for the following issues for this high-risk pregnancy and has Maternal morbid obesity, antepartum (HEdin[place]; Cardiomegaly; Right ovarian cyst; HSV infection; Adjustment disorder with depressed mood; Abnormal antibody titer (anti-Lewis antibodies); Rh negative state in antepartum period; Cardiomyopathy, nonischemic (HN[place]; History of IUFD at 317 weeks Mitral regurgitation of mother during pregnancy; History of maternal cardiomyopathy, currently pregnant; Supervision of high risk pregnancy, antepartum; Heart failure with reduced ejection fraction (HE[place]; Atypical squamous cell changes of undetermined significance (ASCUS) with negative high risk human papilloma virus (HPV) pap 01/05/2022; Anemia of pregnancy; BMI 45.0-49.9, adult (HDe Tour Village; Red Chart Patient; and GBS (group B Streptococcus carrier), +RV culture, currently pregnant on their problem list.  Patient reports no complaints.  Contractions: Not present. Vag. Bleeding: None.  Movement: Present. Denies leaking of fluid.   The following portions of the patient's history were reviewed and updated as appropriate: allergies, current medications, past family history, past medical history, past social history, past surgical history and problem list.   Objective:   Vitals:   05/14/22 1319  BP: 114/75  Pulse: (!) 105  Weight: (!) 321 lb 1.6 oz (145.7 kg)    Fetal Status: Fetal Heart Rate (bpm): 132   Movement: Present     General:  Alert, oriented and cooperative. Patient is in no acute distress.  Skin: Skin is warm and dry. No rash noted.   Cardiovascular: Normal heart rate noted  Respiratory: Normal respiratory effort, no problems with respiration noted  Abdomen: Soft, gravid, appropriate for gestational age.  Pain/Pressure: Present     Pelvic: Cervical exam deferred        Extremities: Normal range  of motion.  Edema: None  Mental Status: Normal mood and affect. Normal behavior. Normal judgment and thought content.   Assessment and Plan:  Pregnancy: G2P0100 at 360w0d.  Supervision of high risk pregnancy Continue weekly testing Patient already set up for 3/26 IOL; anesthesia recommends room 18 [id number], bpp 8/8, afi 18 2/20: efw 61%, 2178gm, ac 35%, afi 13 GBS positive  2. Mitral regurgitation of mother during pregnancy [facility] last visit 1/19 with next one tomorrow. Weight stable 3/5 and 1/9 echo stable to slightly improved with stable EF at 40-45%, mild global hypokinesis*** but with MR slightly improved with now moderate MR from moderate to severe.  Patient confirms she is taking the daily toprol and the weekly lasix.  Follow up repeat 3/5 echo   3. Heart failure with reduced ejection fraction (HCSt. JosephSee above   4. Rh negative state in antepartum period S/p rhogam on 1/22   5. Maternal morbid obesity, antepartum (HCTroyWeight stable from last visit   6. BMI 45.0-49.9, adult (HCTalmage  7. HSV infection Last outbreak in December. Precautions given and recommend starting prophylaxis now which she is amenable to   8. History of IUFD at 3271eeks F/u weekly dopplers tomorrow.  1/30: 28%, 1395gm, ac 8% with normal UA dopplers   9. Anemia during pregnancy in third trimester     Latest Ref Rng & Units 03/09/2022    8:37 AM 01/05/2022    4:42 PM 03/14/2021   10:07 AM  CBC  WBC 3.4 - 10.8 x10E3/uL 9.6  9.5  9.9   Hemoglobin 11.1 - 15.9 g/dL 10.7  11.6  13.1   Hematocrit 34.0 - 46.6 % 31.7  34.6  38.1   Platelets 150 - 450 x10E3/uL 203  203  200     10. Atypical squamous cell changes of undetermined significance (ASCUS) with negative high risk human papilloma virus (HPV) pap 01/05/2022   11. Abnormal antibody titer (anti-Lewis antibodies) F/u L&D admit antibody screen   12. Right ovarian cyst 12x8x7cm on 1/30 ultrasound. Patient asymptomatic. Continue  to follow  Preterm labor symptoms and general obstetric precautions including but not limited to vaginal bleeding, contractions, leaking of fluid and fetal movement were reviewed in detail with the patient. Please refer to After Visit Summary for other counseling recommendations.   No follow-ups on file.  Future Appointments  Date Time Provider [place]  05/15/2022  1:40 PM Berniece Salines, DO CVD-WMC None  05/19/2022  2:45 PM WMC-MFC NURSE W[facility]MFC [facility]  05/19/2022  3:00 PM WMC-MFC US1 WMC-MFCUS [facility]  05/20/2022 10:15 AM Caren Macadam, MD [facility]  05/26/2022  6:45 AM MC-LD SCHED ROOM MC-INDC None    Aletha Halim, MD

## 2022-05-15 ENCOUNTER — Ambulatory Visit (INDEPENDENT_AMBULATORY_CARE_PROVIDER_SITE_OTHER): Payer: Medicaid Other | Admitting: Cardiology

## 2022-05-15 ENCOUNTER — Encounter: Payer: Self-pay | Admitting: Cardiology

## 2022-05-15 VITALS — BP 108/60 | HR 93 | Ht 71.0 in | Wt 321.0 lb

## 2022-05-15 DIAGNOSIS — Z6841 Body Mass Index (BMI) 40.0 and over, adult: Secondary | ICD-10-CM

## 2022-05-15 DIAGNOSIS — I428 Other cardiomyopathies: Secondary | ICD-10-CM

## 2022-05-15 DIAGNOSIS — I5020 Unspecified systolic (congestive) heart failure: Secondary | ICD-10-CM

## 2022-05-15 DIAGNOSIS — Z3A36 36 weeks gestation of pregnancy: Secondary | ICD-10-CM

## 2022-05-15 DIAGNOSIS — I340 Nonrheumatic mitral (valve) insufficiency: Secondary | ICD-10-CM

## 2022-05-15 DIAGNOSIS — O99419 Diseases of the circulatory system complicating pregnancy, unspecified trimester: Secondary | ICD-10-CM

## 2022-05-15 NOTE — Progress Notes (Addendum)
Cardio-Obstetrics Clinic  New Evaluation  Date:  05/15/2022   ID:  Kentlee Bibeau, DOB 2000-05-22, MRN [id number]  PCP:  Precious Gilding, DO   [facility] Providers Cardiologist:  Berniece Salines, DO  Electrophysiologist:  None       Referring MD: Precious Gilding, DO   Chief Complaint: " I am doing fine but have intermittent shortness of breath"  History of Present Illness:    Jamela Janz is a 22 y.o. female [G2P0100] who is being seen today for the evaluation of depressed ejection fraction/Dilated cardiomyopathy at the request of Precious Gilding, DO.   I saw the patient on on 12/31/2000 at that time she was pregnant. Echo showed depressed ejection 40-45%. We discussed plans. Unfortunately she did not follow up. She tells me today that she lost her last baby girl at 40 weeks. After that she stopped taking the beta blocker.   Her echo in 03/2021 showed low normal EF.  Recent echo back down mildy depressed 40-45% 11/25/2021.  I saw her on 12/26/2021 at that time she was experiencing shortness of breath - I give her cautious diuretic. Kept her on her toprol and repeated her echo.   At her last visit we added Lasix  once weekely. Since her last visit she has not had any complaints.  She is currently 36 weeks 1 day pregnant.  Prior CV Studies Reviewed: The following studies were reviewed today:  11/25/2021 IMPRESSIONS   1. Left ventricular ejection fraction, by estimation, is 40 to 45%. The left ventricle has mildly decreased function. The left ventricle  demonstrates global hypokinesis. The left ventricular internal cavity size was moderately dilated. Left ventricular  diastolic parameters are indeterminate. The average left ventricular global longitudinal strain is -16.7 %. The global longitudinal strain is  abnormal.   2. Right ventricular systolic function is normal. The right ventricular size is normal.   3. The mitral valve is normal in structure. Moderate to severe mitral valve  regurgitation. No evidence of mitral stenosis.   4. The aortic valve is tricuspid. Aortic valve regurgitation is not visualized. No aortic stenosis is present.   5. The inferior vena cava is normal in size with greater than 50% respiratory variability, suggesting right atrial pressure of 3 mmHg.   FINDINGS   Left Ventricle: Left ventricular ejection fraction, by estimation, is 40 to 45%. The left ventricle has mildly decreased function. The left  ventricle demonstrates global hypokinesis. The average left ventricular global longitudinal strain is -16.7 %.  The global longitudinal strain is abnormal. The left ventricular internal cavity size was moderately dilated. There is no left ventricular  hypertrophy. Left ventricular diastolic parameters are indeterminate.   Right Ventricle: The right ventricular size is normal. Right ventricular  systolic function is normal.   Left Atrium: Left atrial size was normal in size.   Right Atrium: Right atrial size was normal in size.   Pericardium: There is no evidence of pericardial effusion.   Mitral Valve: The mitral valve is normal in structure. Moderate to severe  mitral valve regurgitation. No evidence of mitral valve stenosis. MV peak  gradient, 5.0 mmHg. The mean mitral valve gradient is 2.0 mmHg.   Tricuspid Valve: The tricuspid valve is normal in structure. Tricuspid  valve regurgitation is trivial. No evidence of tricuspid stenosis.   Aortic Valve: The aortic valve is tricuspid. Aortic valve regurgitation is  not visualized. No aortic stenosis is present.   Pulmonic Valve: The pulmonic valve was normal in structure. Pulmonic valve  regurgitation is trivial. No evidence of pulmonic stenosis.   Aorta: The aortic root is normal in size and structure.   Venous: The inferior vena cava is normal in size with greater than 50%  respiratory variability, suggesting right atrial pressure of 3 mmHg.   IAS/Shunts: No atrial level shunt detected  by color flow Doppler.      Past Medical History:  Diagnosis Date   Abnormal antibody titer (anti-Lewis antibodies) 09/30/2020   Adjustment disorder with depressed mood 09/30/2020   Cardiomegaly 12/18/2012   still working with cardiology-esp now with pregnancy   Cardiomyopathy, nonischemic ([place]) 02/05/2021   Chlamydia 09/23/2018   Treated 09/23/2018   Gonorrhea 09/23/2018   Treated 09/23/2018   Heart failure with reduced ejection fraction ([place]) 01/05/2022   Her echo in 03/2021 showed low normal EF.  Recent echo back down mildy depressed 40-45% 11/25/2021.    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   Migraines    Mitral regurgitation    Morbid obesity (HCC)    Ovarian cyst    Pregnancy induced hypertension     Past Surgical History:  Procedure Laterality Date   TYMPANOSTOMY TUBE PLACEMENT        OB History     Gravida  2   Para  1   Term  0   Preterm  1   AB  0   Living  0      SAB  0   IAB  0   Ectopic  0   Multiple  0   Live Births  0               Current Medications: Current Meds  Medication Sig   aspirin EC 81 MG tablet Take 2 tablets (162 mg total) by mouth daily. Take after 12 weeks for prevention of preeclampsia later in pregnancy   famotidine (PEPCID) 20 MG tablet Take 1 tablet (20 mg total) by mouth 2 (two) times daily.   ferrous sulfate (FERROUSUL) 325 (65 FE) MG tablet Take 1 tablet (325 mg total) by mouth every other day.   furosemide (LASIX) 20 MG tablet TAKE 1 TABLET BY MOUTH ONCE A WEEK   metoprolol succinate (TOPROL XL) 25 MG 24 hr tablet Take 1 tablet (25 mg total) by mouth daily.   montelukast (SINGULAIR) 10 MG tablet Take 1 tablet (10 mg total) by mouth at bedtime.   potassium chloride (KLOR-CON) 10 MEQ tablet TAKE 1 TABLET BY MOUTH ONCE A WEEK   Prenatal 27-1 MG TABS Take 1 tablet by mouth daily.   valACYclovir (VALTREX) 500 MG tablet Take 1 tablet (500 mg total) by mouth daily. Can increase to twice a day for 5 days in the event of a  recurrence     Allergies:   Patient has no known allergies.   Social History   Socioeconomic History   Marital status: Single    Spouse name: Not on file   Number of children: Not on file   Years of education: Not on file   Highest education level: Not on file  Occupational History   Not on file  Tobacco Use   Smoking status: Former    Packs/day: 0    Types: Cigars, Cigarettes    Quit date: 09/03/2020    Years since quitting: 1.6   Smokeless tobacco: Never  Vaping Use   Vaping Use: Never used  Substance and Sexual Activity   Alcohol use: No   Drug use: No   Sexual activity: Yes  Other Topics  Concern   Not on file  Social History Narrative   Patient is living with her dad at this time in school still getting passing grades   Social Determinants of Health   Financial Resource Strain: Low Risk  (01/10/2021)   Overall Financial Resource Strain (CARDIA)    Difficulty of Paying Living Expenses: Not hard at all  Food Insecurity: Food Insecurity Present (01/05/2022)   Hunger Vital Sign    Worried About Running Out of Food in the Last Year: Sometimes true    Ran Out of Food in the Last Year: Sometimes true  Transportation Needs: No Transportation Needs (01/05/2022)   PRAPARE - Hydrologist (Medical): No    Lack of Transportation (Non-Medical): No  Physical Activity: Not on file  Stress: Not on file  Social Connections: Not on file      Family History  Problem Relation Age of Onset   Raynaud syndrome Mother    Diabetes Father    Hyperlipidemia Father    Hypertension Father    Asthma Neg Hx    Stroke Neg Hx       ROS:   Please see the history of present illness.      Review of Systems  Constitution: Negative for decreased appetite, fever and weight gain.  HENT: Negative for congestion, ear discharge, hoarse voice and sore throat.   Eyes: Negative for discharge, redness, vision loss in right eye and visual halos.  Cardiovascular:  Reports shortness of breath on exertion.  Negative for chest pain,  leg swelling, orthopnea and palpitations.  Respiratory: Negative for cough, hemoptysis, shortness of breath and snoring.   Endocrine: Negative for heat intolerance and polyphagia.  Hematologic/Lymphatic: Negative for bleeding problem. Does not bruise/bleed easily.  Skin: Negative for flushing, nail changes, rash and suspicious lesions.  Musculoskeletal: Negative for arthritis, joint pain, muscle cramps, myalgias, neck pain and stiffness.  Gastrointestinal: Negative for abdominal pain, bowel incontinence, diarrhea and excessive appetite.  Genitourinary: Negative for decreased libido, genital sores and incomplete emptying.  Neurological: Negative for brief paralysis, focal weakness, headaches and loss of balance.  Psychiatric/Behavioral: Negative for altered mental status, depression and suicidal ideas.  Allergic/Immunologic: Negative for HIV exposure and persistent infections.     Labs/EKG Reviewed:    EKG:   EKG is was ordered today.     TTE 03/2022 IMPRESSIONS   1. Left ventricular ejection fraction by 3D volume is 41 %. The left  ventricle has mild to moderately decreased function. The left ventricle  demonstrates global hypokinesis. The left ventricular internal cavity size  was moderately dilated. Left  ventricular diastolic parameters were normal. The average left ventricular  global longitudinal strain is -14.2 %. The global longitudinal strain is  abnormal.   2. Right ventricular systolic function is normal. The right ventricular  size is normal. There is normal pulmonary artery systolic pressure.   3. The mitral valve is normal in structure. Moderate mitral valve  regurgitation. No evidence of mitral stenosis.   4. The aortic valve is normal in structure. Aortic valve regurgitation is  not visualized. No aortic stenosis is present.   5. The inferior vena cava is normal in size with greater than 50%   respiratory variability, suggesting right atrial pressure of 3 mmHg.   Comparison(s): No significant change from prior study. Prior images  reviewed side by side.   Recent Labs: 01/05/2022: ALT 8; BNP 58.1; BUN 9; Creatinine, Ser 0.60; Potassium 4.2; Sodium 138; TSH 0.782 04/20/2022:  Hemoglobin 10.7; Platelets 247   Recent Lipid Panel No results found for: "CHOL", "TRIG", "HDL", "CHOLHDL", "LDLCALC", "LDLDIRECT"  Physical Exam:    VS:  BP 108/60   Pulse 93   Ht 5\' 11"  (1.803 m)   Wt (!) 145.6 kg   LMP 09/04/2021 (Exact Date)   SpO2 98%   BMI 44.77 kg/m     Wt Readings from Last 3 Encounters:  05/15/22 (!) 145.6 kg  05/14/22 (!) 145.7 kg  05/06/22 (!) 146 kg     GEN:  Well nourished, well developed in no acute distress HEENT: Normal NECK: No JVD; No carotid bruits LYMPHATICS: No lymphadenopathy CARDIAC: RRR, no murmurs, rubs, gallops RESPIRATORY:  Clear to auscultation without rales, wheezing or rhonchi  ABDOMEN: Soft, non-tender, non-distended MUSCULOSKELETAL:  No edema; No deformity  SKIN: Warm and dry NEUROLOGIC:  Alert and oriented x 3 PSYCHIATRIC:  Normal affect    Risk Assessment/Risk Calculators:                  ASSESSMENT & PLAN:    Depressed ejection fraction Heart failure with reduced ejection fraction Obesity affecting pregnancy Moderate Mitral Regurgitation   Her recent echo 05/05/2022 EF still moderately depressed - 40-45%. MR slightly improved was moderate to severe now moderate.  Given his young patient suspect that this etiology may be nonischemic but will limited to any evaluation to understand her cardiomyopathy.  Treatment is paramount here for this patient.  She is no longer on her toprol xl will restart.    Hopefully postpartum we can consider treatment repeat echocardiogram as well as probably cardiac MRI.  She having some shortness of breath - continue on her Lasix  I have educated patient about warning signs understand if she  gets worsening shortness of breath to notify my office.  I encouraged the patient to keep her weight gain less than 20 pounds during her pregnancy.  Follow-up in 12 weeks or sooner if needed.  Patient Instructions  Medication Instructions:  Your physician recommends that you continue on your current medications as directed. Please refer to the Current Medication list given to you today.  *If you need a refill on your cardiac medications before your next appointment, please call your pharmacy*   Lab Work: None   Testing/Procedures: NONE ordered at this time of appointment    Follow-Up: At [facility], you and your health needs are our priority.  As part of our continuing mission to provide you with exceptional heart care, we have created designated Provider Care Teams.  These Care Teams include your primary Cardiologist (physician) and Advanced Practice Providers (APPs -  Physician Assistants and Nurse Practitioners) who all work together to provide you with the care you need, when you need it.  Your next appointment:   12 week(s) POSTPARTUM  Provider:   Berniece Salines, DO     Other Instructions     Dispo:  No follow-ups on file.   Medication Adjustments/Labs and Tests Ordered: Current medicines are reviewed at length with the patient today.  Concerns regarding medicines are outlined above.  Tests Ordered: No orders of the defined types were placed in this encounter.  Medication Changes: No orders of the defined types were placed in this encounter.

## 2022-05-15 NOTE — Patient Instructions (Addendum)
Medication Instructions:  Your physician recommends that you continue on your current medications as directed. Please refer to the Current Medication list given to you today.  *If you need a refill on your cardiac medications before your next appointment, please call your pharmacy*   Lab Work: None   Testing/Procedures: NONE ordered at this time of appointment    Follow-Up: At [facility], you and your health needs are our priority.  As part of our continuing mission to provide you with exceptional heart care, we have created designated Provider Care Teams.  These Care Teams include your primary Cardiologist (physician) and Advanced Practice Providers (APPs -  Physician Assistants and Nurse Practitioners) who all work together to provide you with the care you need, when you need it.  Your next appointment:   12 week(s) POSTPARTUM  Provider:   Berniece Salines, DO     Other Instructions

## 2022-05-19 ENCOUNTER — Ambulatory Visit: Payer: Medicaid Other | Attending: Maternal & Fetal Medicine

## 2022-05-19 ENCOUNTER — Other Ambulatory Visit: Payer: Self-pay | Admitting: Maternal & Fetal Medicine

## 2022-05-19 ENCOUNTER — Encounter (HOSPITAL_COMMUNITY): Payer: Self-pay

## 2022-05-19 ENCOUNTER — Telehealth (HOSPITAL_COMMUNITY): Payer: Self-pay | Admitting: *Deleted

## 2022-05-19 ENCOUNTER — Ambulatory Visit: Payer: Medicaid Other | Admitting: *Deleted

## 2022-05-19 ENCOUNTER — Encounter: Payer: Self-pay | Admitting: *Deleted

## 2022-05-19 VITALS — BP 116/64 | HR 99

## 2022-05-19 DIAGNOSIS — O360130 Maternal care for anti-D [Rh] antibodies, third trimester, not applicable or unspecified: Secondary | ICD-10-CM

## 2022-05-19 DIAGNOSIS — O99013 Anemia complicating pregnancy, third trimester: Secondary | ICD-10-CM

## 2022-05-19 DIAGNOSIS — O09293 Supervision of pregnancy with other poor reproductive or obstetric history, third trimester: Secondary | ICD-10-CM | POA: Diagnosis not present

## 2022-05-19 DIAGNOSIS — Z8759 Personal history of other complications of pregnancy, childbirth and the puerperium: Secondary | ICD-10-CM | POA: Insufficient documentation

## 2022-05-19 DIAGNOSIS — O99213 Obesity complicating pregnancy, third trimester: Secondary | ICD-10-CM | POA: Diagnosis not present

## 2022-05-19 DIAGNOSIS — O3483 Maternal care for other abnormalities of pelvic organs, third trimester: Secondary | ICD-10-CM

## 2022-05-19 DIAGNOSIS — Z3A36 36 weeks gestation of pregnancy: Secondary | ICD-10-CM | POA: Diagnosis not present

## 2022-05-19 DIAGNOSIS — N83291 Other ovarian cyst, right side: Secondary | ICD-10-CM | POA: Diagnosis not present

## 2022-05-19 DIAGNOSIS — E669 Obesity, unspecified: Secondary | ICD-10-CM | POA: Diagnosis not present

## 2022-05-19 DIAGNOSIS — D649 Anemia, unspecified: Secondary | ICD-10-CM

## 2022-05-19 DIAGNOSIS — I429 Cardiomyopathy, unspecified: Secondary | ICD-10-CM | POA: Diagnosis not present

## 2022-05-19 DIAGNOSIS — O99413 Diseases of the circulatory system complicating pregnancy, third trimester: Secondary | ICD-10-CM

## 2022-05-19 DIAGNOSIS — O0990 Supervision of high risk pregnancy, unspecified, unspecified trimester: Secondary | ICD-10-CM | POA: Diagnosis not present

## 2022-05-19 NOTE — Telephone Encounter (Signed)
Preadmission screen  

## 2022-05-20 ENCOUNTER — Ambulatory Visit: Payer: Medicaid Other | Admitting: Family Medicine

## 2022-05-20 ENCOUNTER — Other Ambulatory Visit: Payer: Self-pay | Admitting: Advanced Practice Midwife

## 2022-05-20 ENCOUNTER — Other Ambulatory Visit: Payer: Self-pay

## 2022-05-20 ENCOUNTER — Telehealth (HOSPITAL_COMMUNITY): Payer: Self-pay | Admitting: *Deleted

## 2022-05-20 VITALS — BP 106/64 | HR 116 | Wt 321.0 lb

## 2022-05-20 DIAGNOSIS — O99213 Obesity complicating pregnancy, third trimester: Secondary | ICD-10-CM

## 2022-05-20 DIAGNOSIS — Z6791 Unspecified blood type, Rh negative: Secondary | ICD-10-CM

## 2022-05-20 DIAGNOSIS — I340 Nonrheumatic mitral (valve) insufficiency: Secondary | ICD-10-CM

## 2022-05-20 DIAGNOSIS — Z3A36 36 weeks gestation of pregnancy: Secondary | ICD-10-CM | POA: Diagnosis not present

## 2022-05-20 DIAGNOSIS — O0993 Supervision of high risk pregnancy, unspecified, third trimester: Secondary | ICD-10-CM | POA: Diagnosis not present

## 2022-05-20 DIAGNOSIS — O0990 Supervision of high risk pregnancy, unspecified, unspecified trimester: Secondary | ICD-10-CM

## 2022-05-20 DIAGNOSIS — O99419 Diseases of the circulatory system complicating pregnancy, unspecified trimester: Secondary | ICD-10-CM

## 2022-05-20 DIAGNOSIS — I428 Other cardiomyopathies: Secondary | ICD-10-CM

## 2022-05-20 DIAGNOSIS — O99413 Diseases of the circulatory system complicating pregnancy, third trimester: Secondary | ICD-10-CM

## 2022-05-20 DIAGNOSIS — O26893 Other specified pregnancy related conditions, third trimester: Secondary | ICD-10-CM

## 2022-05-20 DIAGNOSIS — Z8759 Personal history of other complications of pregnancy, childbirth and the puerperium: Secondary | ICD-10-CM

## 2022-05-20 DIAGNOSIS — O99820 Streptococcus B carrier state complicating pregnancy: Secondary | ICD-10-CM

## 2022-05-20 DIAGNOSIS — I5020 Unspecified systolic (congestive) heart failure: Secondary | ICD-10-CM

## 2022-05-20 DIAGNOSIS — B009 Herpesviral infection, unspecified: Secondary | ICD-10-CM

## 2022-05-20 NOTE — Progress Notes (Signed)
   PRENATAL VISIT NOTE  Subjective:  Jamie Gates is a 22 y.o. G2P0100 at [redacted]w[redacted]d being seen today for ongoing prenatal care.  She is currently monitored for the following issues for this high-risk pregnancy and has Maternal morbid obesity, antepartum ([place]); Cardiomegaly; Right ovarian cyst; HSV infection; Adjustment disorder with depressed mood; Abnormal antibody titer (anti-Lewis antibodies); Rh negative state in antepartum period; Cardiomyopathy, nonischemic ([employer]); History of IUFD at 65 weeks; Mitral regurgitation of mother during pregnancy; History of maternal cardiomyopathy, currently pregnant; Supervision of high risk pregnancy, antepartum; Heart failure with reduced ejection fraction ([place]); Atypical squamous cell changes of undetermined significance (ASCUS) with negative high risk human papilloma virus (HPV) pap 01/05/2022; Anemia of pregnancy; BMI 45.0-49.9, adult ([place]); Red Chart Patient; and GBS (group B Streptococcus carrier), +RV culture, currently pregnant on their problem list.  Patient reports no complaints.  Contractions: Not present. Vag. Bleeding: None.  Movement: Present. Denies leaking of fluid.   The following portions of the patient's history were reviewed and updated as appropriate: allergies, current medications, past family history, past medical history, past social history, past surgical history and problem list.   Objective:   Vitals:   05/20/22 1014  BP: 106/64  Pulse: (!) 116  Weight: (!) 321 lb (145.6 kg)    Fetal Status: Fetal Heart Rate (bpm): 135   Movement: Present     General:  Alert, oriented and cooperative. Patient is in no acute distress.  Skin: Skin is warm and dry. No rash noted.   Cardiovascular: Normal heart rate noted  Respiratory: Normal respiratory effort, no problems with respiration noted  Abdomen: Soft, gravid, appropriate for gestational age.  Pain/Pressure: Present     Pelvic: Cervical exam deferred        Extremities: Normal range of  motion.     Mental Status: Normal mood and affect. Normal behavior. Normal judgment and thought content.   Assessment and Plan:  Pregnancy: G2P0100 at [redacted]w[redacted]d 1. Supervision of high risk pregnancy, antepartum Up to date, planning IOL 3/26 Last [place] on 3/19 showed normal fetal growth at 3057 gm (59th%) Vertex on leopold's today and on [place] yesterday  2. Mitral regurgitation of mother during pregnancy  3. Cardiomyopathy, nonischemic (HCC) No current sx of HF  4. History of IUFD at 32 weeks Induction at 37 weeks Vigorous movement FHR is WNL  5. Heart failure with reduced ejection fraction ([place]) Last EF was 41% in Jan 2024  6. Rh negative state in antepartum period S/p rhogam  7. Maternal morbid obesity, antepartum (HCC) Twg=21 lb (9.526 kg)   8. HSV infection Taking ppx Denies any prodromal symptoms  9. GBS (group B Streptococcus carrier), +RV culture, currently pregnant PCN in labor   Preterm labor symptoms and general obstetric precautions including but not limited to vaginal bleeding, contractions, leaking of fluid and fetal movement were reviewed in detail with the patient. Please refer to After Visit Summary for other counseling recommendations.   Return in about 1 week (around 05/27/2022) for IOL on 3/26.  Future Appointments  Date Time Provider [place]  05/26/2022  6:45 AM [facility] SCHED ROOM [facility] None  07/31/2022  1:20 PM Tobb, Godfrey Pick, DO [facility] None    Caren Macadam, MD

## 2022-05-20 NOTE — Telephone Encounter (Signed)
Preadmission screen  

## 2022-05-21 ENCOUNTER — Inpatient Hospital Stay (HOSPITAL_COMMUNITY)
Admission: AD | Admit: 2022-05-21 | Discharge: 2022-05-21 | Disposition: A | Discharge status: Home / Self Care | Payer: Medicaid Other | Attending: Obstetrics & Gynecology | Admitting: Obstetrics & Gynecology

## 2022-05-21 ENCOUNTER — Encounter (HOSPITAL_COMMUNITY): Payer: Self-pay | Admitting: Obstetrics & Gynecology

## 2022-05-21 DIAGNOSIS — Z3689 Encounter for other specified antenatal screening: Secondary | ICD-10-CM

## 2022-05-21 DIAGNOSIS — O99213 Obesity complicating pregnancy, third trimester: Secondary | ICD-10-CM | POA: Insufficient documentation

## 2022-05-21 DIAGNOSIS — O26893 Other specified pregnancy related conditions, third trimester: Secondary | ICD-10-CM | POA: Insufficient documentation

## 2022-05-21 DIAGNOSIS — O98513 Other viral diseases complicating pregnancy, third trimester: Secondary | ICD-10-CM | POA: Diagnosis not present

## 2022-05-21 DIAGNOSIS — Z3A37 37 weeks gestation of pregnancy: Secondary | ICD-10-CM

## 2022-05-21 DIAGNOSIS — O10913 Unspecified pre-existing hypertension complicating pregnancy, third trimester: Secondary | ICD-10-CM | POA: Diagnosis not present

## 2022-05-21 LAB — URINALYSIS, ROUTINE W REFLEX MICROSCOPIC
Bilirubin Urine: NEGATIVE
Glucose, UA: NEGATIVE mg/dL
Hgb urine dipstick: NEGATIVE
Ketones, ur: 5 mg/dL — AB
Nitrite: NEGATIVE
Protein, ur: >=300 mg/dL — AB
Specific Gravity, Urine: 1.027 (ref 1.005–1.030)
pH: 5 (ref 5.0–8.0)

## 2022-05-21 NOTE — MAU Note (Signed)
.  Jamie Gates is a 22 y.o. at [redacted]w[redacted]d here in MAU reporting: she got into an argument with her partner and felt like her BP became elevated from the argument. She reports her BP reading at home was high, but does not remember what the reading was. She denies any physical altercation. She reports DFM today. Reports some mild pelvic pain that has been on-going throughout her pregnancy.  PIH Assessment: Headache present: No  Visual disturbances: None RUQ pain/Epigastric: None Atypical edema: None Hx of HBP: Patient denies BP Medications: N/A   Vaginal Bleeding:  Denies Abnormal discharge/LOF:  Denies   LMP: N/A Onset of complaint: Today Pain score:  3/10 Vitals:   05/21/22 1633  BP: (!) 110/50  Pulse: 99  Resp: 18  Temp: 99.3 F (37.4 C)  SpO2: 99%      FHT:135 bpm via  External; FM palpated OB Office: Faculty Lab orders placed from triage: Urinalysis

## 2022-05-21 NOTE — MAU Provider Note (Signed)
History     CSN: [id number]  Arrival date and time: 05/21/22 1602   None     Chief Complaint  Patient presents with   Hypertension   Decreased Fetal Movement   HPI Jamie Gates is a 22 y.o. G2P0100 at [redacted]w[redacted]d who presents to MAU for elevated blood pressure. She reports she got into an argument with her baby's father today and came here right after because she "didn't feel right". She reports she started to see spots in her vision and feel lightheaded after the argument. She did not check her blood pressure but reports it "just felt high". She denies headaches, RUQ/epigastric pain, or significant swelling. She is no longer seeing spots in her vision. She denies contractions, vaginal bleeding, or leaking fluid. She reports decreased fetal movement since the argument but reports she has felt movement since arrival to MAU. She just wants to make sure baby is okay.   Pregnancy course: hx of IUFD at 34 weeks, heart failure with reduced EF, cardiomyopathy, mitral regurgitation. Patient receives [facility]. She is scheduled for IOL on 3/26.    OB History     Gravida  2   Para  1   Term  0   Preterm  1   AB  0   Living  0      SAB  0   IAB  0   Ectopic  0   Multiple  0   Live Births  0           Past Medical History:  Diagnosis Date   Abnormal antibody titer (anti-Lewis antibodies) 09/30/2020   Adjustment disorder with depressed mood 09/30/2020   Cardiomegaly 12/18/2012   still working with cardiology-esp now with pregnancy   Cardiomyopathy, nonischemic ([place]) 02/05/2021   Chlamydia 09/23/2018   Treated 09/23/2018   Gonorrhea 09/23/2018   Treated 09/23/2018   Heart failure with reduced ejection fraction ([place]) 01/05/2022   Her echo in 03/2021 showed low normal EF.  Recent echo back down mildy depressed 40-45% 11/25/2021.    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   Migraines    Mitral regurgitation    Morbid obesity (HCC)    Ovarian cyst    Pregnancy induced hypertension      Past Surgical History:  Procedure Laterality Date   TYMPANOSTOMY TUBE PLACEMENT      Family History  Problem Relation Age of Onset   Raynaud syndrome Mother    Diabetes Father    Hyperlipidemia Father    Hypertension Father    Asthma Neg Hx    Stroke Neg Hx     Social History   Tobacco Use   Smoking status: Former    Packs/day: 0    Types: Cigars, Cigarettes    Quit date: 09/03/2020    Years since quitting: 1.7   Smokeless tobacco: Never  Vaping Use   Vaping Use: Never used  Substance Use Topics   Alcohol use: No   Drug use: No    Allergies: No Known Allergies  No medications prior to admission.   Review of Systems  Constitutional: Negative.   Eyes:  Positive for visual disturbance (seeing spots).  All other systems reviewed and are negative.  Physical Exam  Patient Vitals for the past 24 hrs:  BP Temp Temp src Pulse Resp SpO2 Height Weight  05/21/22 1734 112/60 -- -- 76 -- -- -- --  05/21/22 1639 (!) 114/51 -- -- 97 -- -- -- --  05/21/22 1633 (!) 110/50 99.3 F (  37.4 C) Oral 99 18 99 % 5\' 10"  (1.778 m) (!) 147 kg   Physical Exam Vitals and nursing note reviewed.  Constitutional:      General: She is not in acute distress.    Appearance: She is obese.  Eyes:     Extraocular Movements: Extraocular movements intact.     Pupils: Pupils are equal, round, and reactive to light.  Cardiovascular:     Rate and Rhythm: Normal rate.  Pulmonary:     Effort: Pulmonary effort is normal. No respiratory distress.  Abdominal:     Palpations: Abdomen is soft.     Tenderness: There is no abdominal tenderness.  Musculoskeletal:        General: Normal range of motion.     Cervical back: Normal range of motion.  Skin:    General: Skin is warm and dry.  Neurological:     General: No focal deficit present.     Mental Status: She is alert and oriented to person, place, and time.  Psychiatric:        Mood and Affect: Mood normal.        Behavior: Behavior normal.     NST FHR: 120 bpm, moderate variability, +15x15 accels, no decels Toco: quiet  MAU Course  Procedures  MDM NST  NST reactive and reassuring. Patient given NST clicker and pressed several times. She reports feeling normal fetal movement. BP's all normotensive. She does not have any symptoms for pre-eclampsia. Low suspicion at this time.   Assessment and Plan   1. [redacted] weeks gestation of pregnancy   2. NST (non-stress test) reactive    - Discharge home in stable condition - Strict return precautions reviewed. Return to MAU as needed for new/worsening symptoms - Keep IOL as scheduled on 3/26  Renee Harder, CNM 05/21/2022, 5:48 PM

## 2022-05-22 ENCOUNTER — Encounter (HOSPITAL_COMMUNITY): Payer: Self-pay | Admitting: *Deleted

## 2022-05-22 ENCOUNTER — Telehealth (HOSPITAL_COMMUNITY): Payer: Self-pay | Admitting: *Deleted

## 2022-05-22 NOTE — Telephone Encounter (Signed)
Preadmission screen  

## 2022-05-26 ENCOUNTER — Encounter (HOSPITAL_COMMUNITY): Payer: Self-pay | Admitting: Obstetrics and Gynecology

## 2022-05-26 ENCOUNTER — Inpatient Hospital Stay (HOSPITAL_COMMUNITY): Payer: Medicaid Other | Admitting: Anesthesiology

## 2022-05-26 ENCOUNTER — Other Ambulatory Visit: Payer: Self-pay

## 2022-05-26 ENCOUNTER — Inpatient Hospital Stay (HOSPITAL_COMMUNITY): Payer: Medicaid Other

## 2022-05-26 ENCOUNTER — Inpatient Hospital Stay (HOSPITAL_COMMUNITY)
Admission: RE | Admit: 2022-05-26 | Discharge: 2022-05-29 | DRG: 806 | Disposition: A | Discharge status: Home / Self Care | Payer: Medicaid Other | Attending: Family Medicine | Admitting: Family Medicine

## 2022-05-26 DIAGNOSIS — B009 Herpesviral infection, unspecified: Secondary | ICD-10-CM | POA: Diagnosis present

## 2022-05-26 DIAGNOSIS — Z6791 Unspecified blood type, Rh negative: Secondary | ICD-10-CM

## 2022-05-26 DIAGNOSIS — I340 Nonrheumatic mitral (valve) insufficiency: Secondary | ICD-10-CM | POA: Diagnosis present

## 2022-05-26 DIAGNOSIS — O164 Unspecified maternal hypertension, complicating childbirth: Secondary | ICD-10-CM | POA: Diagnosis not present

## 2022-05-26 DIAGNOSIS — Z30017 Encounter for initial prescription of implantable subdermal contraceptive: Secondary | ICD-10-CM | POA: Diagnosis not present

## 2022-05-26 DIAGNOSIS — O9832 Other infections with a predominantly sexual mode of transmission complicating childbirth: Secondary | ICD-10-CM | POA: Diagnosis present

## 2022-05-26 DIAGNOSIS — O9942 Diseases of the circulatory system complicating childbirth: Principal | ICD-10-CM | POA: Diagnosis present

## 2022-05-26 DIAGNOSIS — O26893 Other specified pregnancy related conditions, third trimester: Secondary | ICD-10-CM | POA: Diagnosis not present

## 2022-05-26 DIAGNOSIS — O99419 Diseases of the circulatory system complicating pregnancy, unspecified trimester: Secondary | ICD-10-CM

## 2022-05-26 DIAGNOSIS — Z87891 Personal history of nicotine dependence: Secondary | ICD-10-CM

## 2022-05-26 DIAGNOSIS — I429 Cardiomyopathy, unspecified: Secondary | ICD-10-CM | POA: Diagnosis not present

## 2022-05-26 DIAGNOSIS — O99824 Streptococcus B carrier state complicating childbirth: Secondary | ICD-10-CM | POA: Diagnosis not present

## 2022-05-26 DIAGNOSIS — O0990 Supervision of high risk pregnancy, unspecified, unspecified trimester: Secondary | ICD-10-CM

## 2022-05-26 DIAGNOSIS — O9902 Anemia complicating childbirth: Secondary | ICD-10-CM | POA: Diagnosis not present

## 2022-05-26 DIAGNOSIS — Z3490 Encounter for supervision of normal pregnancy, unspecified, unspecified trimester: Secondary | ICD-10-CM

## 2022-05-26 DIAGNOSIS — A6000 Herpesviral infection of urogenital system, unspecified: Secondary | ICD-10-CM | POA: Diagnosis not present

## 2022-05-26 DIAGNOSIS — O99019 Anemia complicating pregnancy, unspecified trimester: Secondary | ICD-10-CM | POA: Diagnosis present

## 2022-05-26 DIAGNOSIS — Z3A37 37 weeks gestation of pregnancy: Secondary | ICD-10-CM

## 2022-05-26 DIAGNOSIS — O99214 Obesity complicating childbirth: Secondary | ICD-10-CM | POA: Diagnosis not present

## 2022-05-26 DIAGNOSIS — O09293 Supervision of pregnancy with other poor reproductive or obstetric history, third trimester: Secondary | ICD-10-CM | POA: Diagnosis not present

## 2022-05-26 DIAGNOSIS — D649 Anemia, unspecified: Secondary | ICD-10-CM | POA: Diagnosis not present

## 2022-05-26 DIAGNOSIS — O903 Peripartum cardiomyopathy: Secondary | ICD-10-CM | POA: Diagnosis not present

## 2022-05-26 DIAGNOSIS — O99820 Streptococcus B carrier state complicating pregnancy: Secondary | ICD-10-CM | POA: Diagnosis not present

## 2022-05-26 DIAGNOSIS — Z8759 Personal history of other complications of pregnancy, childbirth and the puerperium: Secondary | ICD-10-CM

## 2022-05-26 DIAGNOSIS — I5020 Unspecified systolic (congestive) heart failure: Secondary | ICD-10-CM | POA: Diagnosis present

## 2022-05-26 LAB — CBC
HCT: 31.4 % — ABNORMAL LOW (ref 36.0–46.0)
Hemoglobin: 10.7 g/dL — ABNORMAL LOW (ref 12.0–15.0)
MCH: 28.8 pg (ref 26.0–34.0)
MCHC: 34.1 g/dL (ref 30.0–36.0)
MCV: 84.6 fL (ref 80.0–100.0)
Platelets: 211 10*3/uL (ref 150–400)
RBC: 3.71 MIL/uL — ABNORMAL LOW (ref 3.87–5.11)
RDW: 13.5 % (ref 11.5–15.5)
WBC: 10.9 10*3/uL — ABNORMAL HIGH (ref 4.0–10.5)
nRBC: 0 % (ref 0.0–0.2)

## 2022-05-26 LAB — RPR: RPR Ser Ql: NONREACTIVE

## 2022-05-26 MED ORDER — EPHEDRINE 5 MG/ML INJ: 10.0000 mg | INTRAVENOUS | Status: DC | PRN

## 2022-05-26 MED ORDER — MISOPROSTOL 25 MCG QUARTER TABLET
25.0000 ug | ORAL_TABLET | Freq: Once | ORAL | Status: AC
  Administered 2022-05-26: 25 ug via VAGINAL
  Filled 2022-05-26: qty 1

## 2022-05-26 MED ORDER — LIDOCAINE HCL (PF) 1 % IJ SOLN
INTRAMUSCULAR | Status: DC | PRN
  Administered 2022-05-26: 10 mL via EPIDURAL

## 2022-05-26 MED ORDER — SOD CITRATE-CITRIC ACID 500-334 MG/5ML PO SOLN: 30.0000 mL | ORAL | Status: DC | PRN

## 2022-05-26 MED ORDER — MISOPROSTOL 25 MCG QUARTER TABLET
25.0000 ug | ORAL_TABLET | Freq: Once | ORAL | Status: DC
  Filled 2022-05-26: qty 1

## 2022-05-26 MED ORDER — OXYTOCIN-SODIUM CHLORIDE 30-0.9 UT/500ML-% IV SOLN: 2.5000 [IU]/h | INTRAVENOUS | Status: DC

## 2022-05-26 MED ORDER — PENICILLIN G POT IN DEXTROSE 60000 UNIT/ML IV SOLN
3.0000 10*6.[IU] | INTRAVENOUS | Status: DC
  Administered 2022-05-26 – 2022-05-27 (×7): 3 10*6.[IU] via INTRAVENOUS
  Filled 2022-05-26 (×4): qty 50

## 2022-05-26 MED ORDER — PHENYLEPHRINE 80 MCG/ML (10ML) SYRINGE FOR IV PUSH (FOR BLOOD PRESSURE SUPPORT): 80.0000 ug | PREFILLED_SYRINGE | INTRAVENOUS | Status: DC | PRN

## 2022-05-26 MED ORDER — LACTATED RINGERS IV SOLN
500.0000 mL | Freq: Once | INTRAVENOUS | Status: AC
  Administered 2022-05-26: 500 mL via INTRAVENOUS

## 2022-05-26 MED ORDER — FENTANYL-BUPIVACAINE-NACL 0.5-0.125-0.9 MG/250ML-% EP SOLN
12.0000 mL/h | EPIDURAL | Status: DC | PRN
  Administered 2022-05-26 – 2022-05-27 (×2): 12 mL/h via EPIDURAL
  Filled 2022-05-26 (×2): qty 250

## 2022-05-26 MED ORDER — OXYTOCIN BOLUS FROM INFUSION: 333.0000 mL | Freq: Once | INTRAVENOUS | Status: DC

## 2022-05-26 MED ORDER — LACTATED RINGERS AMNIOINFUSION: INTRAVENOUS | Status: DC

## 2022-05-26 MED ORDER — SODIUM CHLORIDE 0.9 % IV SOLN
5.0000 10*6.[IU] | Freq: Once | INTRAVENOUS | Status: AC
  Administered 2022-05-26: 5 10*6.[IU] via INTRAVENOUS
  Filled 2022-05-26: qty 5

## 2022-05-26 MED ORDER — LACTATED RINGERS IV SOLN: 500.0000 mL | INTRAVENOUS | Status: DC | PRN

## 2022-05-26 MED ORDER — MISOPROSTOL 50MCG HALF TABLET
50.0000 ug | ORAL_TABLET | Freq: Once | ORAL | Status: AC
  Administered 2022-05-26: 50 ug via ORAL
  Filled 2022-05-26: qty 1

## 2022-05-26 MED ORDER — DIPHENHYDRAMINE HCL 50 MG/ML IJ SOLN
12.5000 mg | INTRAMUSCULAR | Status: DC | PRN
  Administered 2022-05-26: 12.5 mg via INTRAVENOUS
  Filled 2022-05-26: qty 1

## 2022-05-26 MED ORDER — LACTATED RINGERS IV SOLN: INTRAVENOUS | Status: DC

## 2022-05-26 MED ORDER — FENTANYL CITRATE (PF) 100 MCG/2ML IJ SOLN
50.0000 ug | INTRAMUSCULAR | Status: DC | PRN
  Administered 2022-05-26: 100 ug via INTRAVENOUS
  Filled 2022-05-26: qty 2

## 2022-05-26 MED ORDER — ONDANSETRON HCL 4 MG/2ML IJ SOLN
4.0000 mg | Freq: Four times a day (QID) | INTRAMUSCULAR | Status: DC | PRN
  Administered 2022-05-26 – 2022-05-27 (×2): 4 mg via INTRAVENOUS
  Filled 2022-05-26 (×2): qty 2

## 2022-05-26 MED ORDER — LIDOCAINE HCL (PF) 1 % IJ SOLN: 30.0000 mL | INTRAMUSCULAR | Status: DC | PRN

## 2022-05-26 MED ORDER — OXYTOCIN-SODIUM CHLORIDE 30-0.9 UT/500ML-% IV SOLN
1.0000 m[IU]/min | INTRAVENOUS | Status: DC
  Administered 2022-05-26: 2 m[IU]/min via INTRAVENOUS
  Filled 2022-05-26: qty 500

## 2022-05-26 MED ORDER — ACETAMINOPHEN 325 MG PO TABS: 650.0000 mg | ORAL_TABLET | ORAL | Status: DC | PRN

## 2022-05-26 MED ORDER — LIDOCAINE HCL (PF) 1 % IJ SOLN: INTRAMUSCULAR | Status: DC | PRN

## 2022-05-26 MED ORDER — TERBUTALINE SULFATE 1 MG/ML IJ SOLN
0.2500 mg | Freq: Once | INTRAMUSCULAR | Status: AC | PRN
  Administered 2022-05-27: 0.25 mg via SUBCUTANEOUS
  Filled 2022-05-26: qty 1

## 2022-05-26 NOTE — Anesthesia Procedure Notes (Addendum)
Epidural Patient location during procedure: OB Start time: 05/26/2022 4:50 PM End time: 05/26/2022 4:56 PM  Staffing Anesthesiologist: Lyn Hollingshead, MD Performed: anesthesiologist   Preanesthetic Checklist Completed: patient identified, IV checked, site marked, risks and benefits discussed, surgical consent, monitors and equipment checked, pre-op evaluation and timeout performed  Epidural Patient position: sitting Prep: DuraPrep and site prepped and draped Patient monitoring: continuous pulse ox and blood pressure Approach: midline Location: L3-L4 Injection technique: LOR air  Needle:  Needle type: Tuohy  Needle gauge: 17 G Needle length: 9 cm and 9 Needle insertion depth: 8 cm Catheter type: closed end flexible Catheter size: 19 Gauge Catheter at skin depth: 14 cm Test dose: negative and Other  Assessment Events: blood not aspirated, no cerebrospinal fluid, injection not painful, no injection resistance, no paresthesia and negative IV test  Additional Notes Reason for block:procedure for pain

## 2022-05-26 NOTE — H&P (Addendum)
OBSTETRIC ADMISSION HISTORY AND PHYSICAL  Jamie Gates is a 22 y.o. female G2P0100 with IUP at [redacted]w[redacted]d by LMP presenting for IOL for cardiomyopathy, MR. She reports +FMs, No LOF, no VB, no blurry vision, headaches or peripheral edema, and RUQ pain.  She plans on bottle and breast feeding. She request nexplanon for birth control. She received her prenatal care at [facility]: By LMP --->  Estimated Date of Delivery: 06/11/22  Sono:    @[redacted]w[redacted]d , normal anatomy, cephalic presentation, posterior fundal placenta, 74% EFW @[redacted]w[redacted]d , cephalic, posterior fundal placenta,, EFW 59%, AC 25%  Prenatal History/Complications:  - cardiomyopathy with most recent EF 40-45% (March 2024), mod to severe Mitral regurgitation - Hx of  IUFD at 32wk (in the setting of anti L ab) - resolved FGR (dx at 27wk with AC 9%) - history of HSV on valtrex   Past Medical History: Past Medical History:  Diagnosis Date   Abnormal antibody titer (anti-Lewis antibodies) 09/30/2020   Adjustment disorder with depressed mood 09/30/2020   Cardiomegaly 12/18/2012   still working with cardiology-esp now with pregnancy   Cardiomyopathy, nonischemic ([place]) 02/05/2021   Chlamydia 09/23/2018   Treated 09/23/2018   Gonorrhea 09/23/2018   Treated 09/23/2018   Heart failure with reduced ejection fraction ([place]) 01/05/2022   Her echo in 03/2021 showed low normal EF.  Recent echo back down mildy depressed 40-45% 11/25/2021.    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   Migraines    Mitral regurgitation    Morbid obesity (HCC)    Ovarian cyst    Pregnancy induced hypertension     Past Surgical History: Past Surgical History:  Procedure Laterality Date   TYMPANOSTOMY TUBE PLACEMENT      Obstetrical History: OB History  Gravida Para Term Preterm AB Living  2 1 0 1 0 0  SAB IAB Ectopic Multiple Live Births  0 0 0 0 0    # Outcome Date GA Lbr Len/2nd Weight Sex Delivery Anes PTL Lv  2 Current           1 Preterm 03/15/21 [redacted]w[redacted]d 04:24 / 00:07  1335 g F Vag-Spont EPI  FD     Birth Comments: very tight nuchal cord    Social History Social History   Socioeconomic History   Marital status: Single    Spouse name: Not on file   Number of children: Not on file   Years of education: Not on file   Highest education level: Not on file  Occupational History   Occupation: Scientist, water quality    Comment: [employer]  Tobacco Use   Smoking status: Former    Packs/day: 0    Types: Cigars, Cigarettes    Quit date: 09/03/2020    Years since quitting: 1.7   Smokeless tobacco: Never  Vaping Use   Vaping Use: Never used  Substance and Sexual Activity   Alcohol use: No   Drug use: Not Currently    Types: Marijuana   Sexual activity: Yes    Birth control/protection: None  Other Topics Concern   Not on file  Social History Narrative   Patient is living with her dad at this time in school still getting passing grades   Social Determinants of Health   Financial Resource Strain: Low Risk  (01/10/2021)   Overall Financial Resource Strain (CARDIA)    Difficulty of Paying Living Expenses: Not hard at all  Food Insecurity: No Food Insecurity (05/26/2022)   Hunger Vital Sign    Worried About Running Out of Food  in the Last Year: Never true    [place] Walk in the Last Year: Never true  Transportation Needs: No Transportation Needs (05/26/2022)   PRAPARE - Hydrologist (Medical): No    Lack of Transportation (Non-Medical): No  Physical Activity: Not on file  Stress: Not on file  Social Connections: Not on file    Family History: Family History  Problem Relation Age of Onset   Raynaud syndrome Mother    Diabetes Father    Hyperlipidemia Father    Hypertension Father    Asthma Neg Hx    Stroke Neg Hx     Allergies: No Known Allergies  Medications Prior to Admission  Medication Sig Dispense Refill Last Dose   aspirin EC 81 MG tablet Take 2 tablets (162 mg total) by mouth daily. Take after 12 weeks for prevention of  preeclampsia later in pregnancy 300 tablet 2 Past Week   famotidine (PEPCID) 20 MG tablet Take 1 tablet (20 mg total) by mouth 2 (two) times daily. 60 tablet 3 Past Week   ferrous sulfate (FERROUSUL) 325 (65 FE) MG tablet Take 1 tablet (325 mg total) by mouth every other day. 30 tablet 3 05/25/2022   furosemide (LASIX) 20 MG tablet TAKE 1 TABLET BY MOUTH ONCE A WEEK 30 tablet 1 Past Week   metoprolol succinate (TOPROL XL) 25 MG 24 hr tablet Take 1 tablet (25 mg total) by mouth daily. 90 tablet 1 05/25/2022   montelukast (SINGULAIR) 10 MG tablet Take 1 tablet (10 mg total) by mouth at bedtime. 42 tablet 1 Past Week   potassium chloride (KLOR-CON) 10 MEQ tablet TAKE 1 TABLET BY MOUTH ONCE A WEEK 30 tablet 1 Past Week   Prenatal 27-1 MG TABS Take 1 tablet by mouth daily. 30 tablet 10 05/25/2022   valACYclovir (VALTREX) 500 MG tablet Take 1 tablet (500 mg total) by mouth daily. Can increase to twice a day for 5 days in the event of a recurrence 60 tablet 1 05/25/2022     Review of Systems   All systems reviewed and negative except as stated in HPI  Blood pressure 108/84, pulse 73, temperature 98.5 F (36.9 C), temperature source Oral, resp. rate 18, last menstrual period 09/04/2021. General appearance: alert, in no acute distress Lungs: normal work of breathing Heart: normal rate Presentation: cephalic by exam Fetal monitoring: baseline 125, mod variability, + accel, - decel Uterine activity: quiet SSE: no vesicles in the external genitalia and the vagina. Small white papules visualized on the cervix without vesicles or ulcerations. Dr. Nehemiah Settle visualized and agreed with findings.      Prenatal labs: ABO, Rh: --/--/PENDING (03/26 [id number]) Antibody: PENDING (03/26 0844) Rubella: 5.29 (11/06 1642) RPR: Non Reactive (01/08 0837)  HBsAg: Negative (11/06 1642)  HIV: Non Reactive (01/08 0837)  GBS: Positive/-- (03/06 1531)  2 hr Glucola 79/94/78 Genetic screening:  NIPS:   LR F; AFP:   neg;  Horizon: neg Anatomy US wnl  Prenatal Transfer Tool  Maternal Diabetes: No Genetic Screening: Normal Maternal Ultrasounds/Referrals: IUGR - now resolved Fetal Ultrasounds or other Referrals:  None Maternal Substance Abuse:  No Significant Maternal Medications:  None Significant Maternal Lab Results:  Group B Strep positive Number of Prenatal Visits:greater than 3 verified prenatal visits Other Comments:  None  Results for orders placed or performed during the hospital encounter of 05/26/22 (from the past 24 hour(s))  Type and screen   Collection Time: 05/26/22  8:44 AM  Result Value Ref Range   ABO/RH(D) PENDING    Antibody Screen PENDING    Sample Expiration      05/29/2022,2359 Performed at [facility], [place] [address], [place], [place] [address]     Patient Active Problem List   Diagnosis Date Noted   Pregnancy 05/26/2022   GBS (group B Streptococcus carrier), +RV culture, currently pregnant 05/14/2022   Red Chart Patient 04/21/2022   BMI 45.0-49.9, adult ([place]) 03/23/2022   Anemia of pregnancy 03/10/2022   Atypical squamous cell changes of undetermined significance (ASCUS) with negative high risk human papilloma virus (HPV) pap 01/05/2022 01/08/2022   Heart failure with reduced ejection fraction ([place]) 01/05/2022   Supervision of high risk pregnancy, antepartum 12/10/2021   History of maternal cardiomyopathy, currently pregnant 11/06/2021   History of IUFD at 49 weeks 03/11/2021   Mitral regurgitation of mother during pregnancy 03/11/2021   Cardiomyopathy, nonischemic ([place]) 02/05/2021   Rh negative state in antepartum period 01/07/2021   Adjustment disorder with depressed mood 09/30/2020   Abnormal antibody titer (anti-Lewis antibodies) 09/30/2020   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 G2P0100 at [redacted]w[redacted]d here for  IOL for cardiomyopathy  #Labor: will start with cytotec 50PO /25Vag and Foley balloon  #Pain: Desires epidural #FWB: Cat 1 #ID:  GBS pos - PCN #MOF: breast #MOC: nexplanon #Circ:  N/A  Seen and discussed with Dr. Naaman Plummer [place]-Lott. [name], M.D. PGY-2 Family Medicine Visiting Resident Faculty Practice 05/26/2022 8:59 AM   GME ATTESTATION:  I saw and evaluated the patient. I agree with the findings and the plan of care as documented in the resident's note. I have made changes to documentation as necessary.  Gerlene Fee, DO OB Fellow, [place] for [place] 05/26/2022, 10:19 AM

## 2022-05-26 NOTE — Progress Notes (Addendum)
Labor Progress Note  Zaliya Peeples is a 22 y.o. G2P0100 at [redacted]w[redacted]d presented for IOL for cardiomyopathy, MR  S: s/p epidural. Feeling okay  O:  BP 105/68   Pulse 85   Temp 98 F (36.7 C) (Oral)   Resp 18   Ht 5\' 11"  (1.803 m)   Wt (!) 147.1 kg   LMP 09/04/2021 (Exact Date)   SpO2 100%   BMI 45.24 kg/m   EFM: baseline 120 bpm / moderate variability/ Present accels/ None decels  Toco: q2-3 min, ~ 200 MVU  CVE: Dilation: 4 Effacement (%): 50 Cervical Position: Posterior Station: -3 Presentation: Vertex Exam by:: Dr. Janus Molder   A&P: 22 y.o. G2P0100 [redacted]w[redacted]d  here for IOL as above  #Labor: Progressing well. S/p FB, cytotec. Pit off due to 5-6 mins decel. AROM'd to clear. IUPC placed to better trace contraction. Contracting every 2 mins. Consider terb if there is another prolonged decel #Pain:  Epidural in place #FWB: CAT 1 #GBS positive on PCN  #cardiomyopathy, MR - cardiology following - on tele  Seen with Dr. Naaman Plummer [place] [name], M.D. PGY-2 Family Medicine Visiting Resident Faculty Practice 05/26/2022 5:35 PM

## 2022-05-26 NOTE — Anesthesia Preprocedure Evaluation (Signed)
Anesthesia Evaluation  Patient identified by MRN, date of birth, ID band Patient awake    Reviewed: Allergy & Precautions, H&P , NPO status , Patient's Chart, lab work & pertinent test results, reviewed documented beta blocker date and time   Airway Mallampati: III  TM Distance: >3 FB Neck ROM: full    Dental no notable dental hx.    Pulmonary former smoker   Pulmonary exam normal breath sounds clear to auscultation       Cardiovascular hypertension, Pt. on home beta blockers Normal cardiovascular exam+ Valvular Problems/Murmurs MR  Rhythm:Regular Rate:Normal + Systolic murmurs    Neuro/Psych negative neurological ROS  negative psych ROS   GI/Hepatic Neg liver ROS,GERD  Medicated,,  Endo/Other    Morbid obesity  Renal/GU negative Renal ROS  negative genitourinary   Musculoskeletal negative musculoskeletal ROS (+)    Abdominal  (+) + obese  Peds  Hematology  (+) Blood dyscrasia, anemia   Anesthesia Other Findings onographer Comments: Cardio-OB Echo  IMPRESSIONS     1. Left ventricular ejection fraction, by estimation, is 40 to 45%. The  left ventricle has mildly decreased function. The left ventricle  demonstrates global hypokinesis. The left ventricular internal cavity size  was mildly dilated. Left ventricular  diastolic parameters were normal.   2. Right ventricular systolic function is normal. The right ventricular  size is normal. Tricuspid regurgitation signal is inadequate for assessing  PA pressure.   3. The mitral valve is normal in structure. Moderate to severe mitral  valve regurgitation. No evidence of mitral stenosis.   4. The aortic valve is tricuspid. Aortic valve regurgitation is not  visualized. No aortic stenosis is present.   5. The inferior vena cava is normal in size with greater than 50%  respiratory variability, suggesting right atrial pressure of 3 mmHg.   Comparison(s): No  significant change from prior study. Prior images  reviewed side by side. Mitral insufficiency may be slightly worse.     Reproductive/Obstetrics (+) Pregnancy                             Anesthesia Physical Anesthesia Plan  ASA: 3  Anesthesia Plan: Epidural   Post-op Pain Management:    Induction:   PONV Risk Score and Plan:   Airway Management Planned:   Additional Equipment:   Intra-op Plan:   Post-operative Plan:   Informed Consent: I have reviewed the patients History and Physical, chart, labs and discussed the procedure including the risks, benefits and alternatives for the proposed anesthesia with the patient or authorized representative who has indicated his/her understanding and acceptance.       Plan Discussed with:   Anesthesia Plan Comments:         Anesthesia Quick Evaluation

## 2022-05-27 ENCOUNTER — Encounter (HOSPITAL_COMMUNITY): Payer: Self-pay | Admitting: Obstetrics and Gynecology

## 2022-05-27 DIAGNOSIS — O903 Peripartum cardiomyopathy: Secondary | ICD-10-CM

## 2022-05-27 DIAGNOSIS — Z3A37 37 weeks gestation of pregnancy: Secondary | ICD-10-CM

## 2022-05-27 DIAGNOSIS — O09293 Supervision of pregnancy with other poor reproductive or obstetric history, third trimester: Secondary | ICD-10-CM

## 2022-05-27 DIAGNOSIS — O99820 Streptococcus B carrier state complicating pregnancy: Secondary | ICD-10-CM

## 2022-05-27 MED ORDER — OXYTOCIN 10 UNIT/ML IJ SOLN
INTRAMUSCULAR | Status: AC
  Filled 2022-05-27: qty 1

## 2022-05-27 MED ORDER — ONDANSETRON HCL 4 MG/2ML IJ SOLN: 4.0000 mg | INTRAMUSCULAR | Status: DC | PRN

## 2022-05-27 MED ORDER — ACETAMINOPHEN 325 MG PO TABS
650.0000 mg | ORAL_TABLET | ORAL | Status: DC | PRN
  Administered 2022-05-27 – 2022-05-29 (×6): 650 mg via ORAL
  Filled 2022-05-27 (×6): qty 2

## 2022-05-27 MED ORDER — ZOLPIDEM TARTRATE 5 MG PO TABS: 5.0000 mg | ORAL_TABLET | Freq: Every evening | ORAL | Status: DC | PRN

## 2022-05-27 MED ORDER — SODIUM CHLORIDE 0.9 % IV SOLN: INTRAVENOUS | Status: DC | PRN

## 2022-05-27 MED ORDER — WITCH HAZEL-GLYCERIN EX PADS: 1.0000 | MEDICATED_PAD | CUTANEOUS | Status: DC | PRN

## 2022-05-27 MED ORDER — FUROSEMIDE 20 MG PO TABS
20.0000 mg | ORAL_TABLET | Freq: Every day | ORAL | Status: DC
  Administered 2022-05-27: 20 mg via ORAL
  Filled 2022-05-27: qty 1

## 2022-05-27 MED ORDER — TERBUTALINE SULFATE 1 MG/ML IJ SOLN
INTRAMUSCULAR | Status: AC
  Filled 2022-05-27: qty 1

## 2022-05-27 MED ORDER — COCONUT OIL OIL: 1.0000 | TOPICAL_OIL | Status: DC | PRN

## 2022-05-27 MED ORDER — METOPROLOL TARTRATE 5 MG/5ML IV SOLN
5.0000 mg | Freq: Once | INTRAVENOUS | Status: DC
  Filled 2022-05-27: qty 5

## 2022-05-27 MED ORDER — TERBUTALINE SULFATE 1 MG/ML IJ SOLN
0.2500 mg | Freq: Once | INTRAMUSCULAR | Status: AC
  Administered 2022-05-27: 0.25 mg via SUBCUTANEOUS

## 2022-05-27 MED ORDER — SENNOSIDES-DOCUSATE SODIUM 8.6-50 MG PO TABS
2.0000 | ORAL_TABLET | ORAL | Status: DC
  Administered 2022-05-27 – 2022-05-28 (×2): 2 via ORAL
  Filled 2022-05-27 (×2): qty 2

## 2022-05-27 MED ORDER — IBUPROFEN 600 MG PO TABS
600.0000 mg | ORAL_TABLET | Freq: Four times a day (QID) | ORAL | Status: DC
  Administered 2022-05-27 – 2022-05-29 (×8): 600 mg via ORAL
  Filled 2022-05-27 (×8): qty 1

## 2022-05-27 MED ORDER — DIPHENHYDRAMINE HCL 25 MG PO CAPS: 25.0000 mg | ORAL_CAPSULE | Freq: Four times a day (QID) | ORAL | Status: DC | PRN

## 2022-05-27 MED ORDER — METOPROLOL SUCCINATE ER 25 MG PO TB24
25.0000 mg | ORAL_TABLET | Freq: Every day | ORAL | Status: DC
  Administered 2022-05-27 – 2022-05-29 (×3): 25 mg via ORAL
  Filled 2022-05-27 (×3): qty 1

## 2022-05-27 MED ORDER — OXYTOCIN 10 UNIT/ML IJ SOLN
10.0000 [IU] | Freq: Once | INTRAMUSCULAR | Status: AC
  Administered 2022-05-27: 10 [IU] via INTRAMUSCULAR

## 2022-05-27 MED ORDER — SODIUM CHLORIDE 0.9% FLUSH: 3.0000 mL | Freq: Two times a day (BID) | INTRAVENOUS | Status: DC

## 2022-05-27 MED ORDER — ERYTHROMYCIN 5 MG/GM OP OINT
TOPICAL_OINTMENT | OPHTHALMIC | Status: AC
  Filled 2022-05-27: qty 1

## 2022-05-27 MED ORDER — ONDANSETRON HCL 4 MG PO TABS: 4.0000 mg | ORAL_TABLET | ORAL | Status: DC | PRN

## 2022-05-27 MED ORDER — OXYCODONE HCL 5 MG PO TABS
5.0000 mg | ORAL_TABLET | ORAL | Status: DC | PRN
  Administered 2022-05-27 – 2022-05-28 (×4): 5 mg via ORAL
  Filled 2022-05-27 (×4): qty 1

## 2022-05-27 MED ORDER — PRENATAL MULTIVITAMIN CH
1.0000 | ORAL_TABLET | Freq: Every day | ORAL | Status: DC
  Administered 2022-05-28 – 2022-05-29 (×2): 1 via ORAL
  Filled 2022-05-27 (×2): qty 1

## 2022-05-27 MED ORDER — SIMETHICONE 80 MG PO CHEW: 80.0000 mg | CHEWABLE_TABLET | ORAL | Status: DC | PRN

## 2022-05-27 MED ORDER — BENZOCAINE-MENTHOL 20-0.5 % EX AERO: 1.0000 | INHALATION_SPRAY | CUTANEOUS | Status: DC | PRN

## 2022-05-27 MED ORDER — SODIUM CHLORIDE 0.9% FLUSH: 3.0000 mL | INTRAVENOUS | Status: DC | PRN

## 2022-05-27 MED ORDER — DIBUCAINE (PERIANAL) 1 % EX OINT: 1.0000 | TOPICAL_OINTMENT | CUTANEOUS | Status: DC | PRN

## 2022-05-27 NOTE — Discharge Summary (Signed)
Postpartum Discharge Summary   Patient Name: Jamie Gates DOB: 2000/05/29 MRN: [id number]  Date of admission: 05/26/2022 Delivery date:05/27/2022  Delivering provider: Stormy Card  Date of discharge: 05/29/2022  Admitting diagnosis: Pregnancy [Z34.90] Intrauterine pregnancy: 100w6d     Secondary diagnosis:  Principal Problem:   Vaginal delivery Active Problems:   HSV infection   History of IUFD at 83 weeks   Mitral regurgitation of mother during pregnancy   Supervision of high risk pregnancy, antepartum   Heart failure with reduced ejection fraction (HCC)   Anemia of pregnancy   GBS (group B Streptococcus carrier), +RV culture, currently pregnant  Additional problems: None    Discharge diagnosis: Term Pregnancy Delivered and cardiomyopathy                                               Post partum procedures: nexplanon insertion Augmentation: AROM, Pitocin, Cytotec, and IP Foley Complications: None  Hospital course: Induction of Labor With Vaginal Delivery   22 y.o. yo G2P1101 at [redacted]w[redacted]d was admitted to the hospital 05/26/2022 for induction of labor.  Indication for induction:  cardiomyopathy .  Patient had an labor course complicated by: none Membrane Rupture Time/Date: 5:09 PM ,05/26/2022   Delivery Method:Vaginal, Spontaneous  Episiotomy: None  Lacerations:  None  Details of delivery can be found in separate delivery note.  Patient had a postpartum course complicated by: none. She received 20mg  BID of lasix while on admission and will be discharged with this regimen till day 10 post delivery. After which she will go back to her once weekly furosemide dose she had in pregnancy. She has a follow up scheduled with cardio-obstetrics at the end of May. Blood pressures are well controlled at the time of discharge, no evidence of fluid overload. She will follow up at the office of a 1 week BP and mood check. Patient is discharged home 05/29/22.  Newborn Data: Birth  date:05/27/2022  Birth time:5:26 PM  Gender:Female  Living status:Living  Apgars:9 ,9  Weight:2650 g   Magnesium Sulfate received: No BMZ received: No Rhophylac:Yes MMR:N/A, Rubella Immune T-DaP:Given prenatally Flu: N/A Transfusion:No  Physical exam  Vitals:   05/28/22 1618 05/28/22 2058 05/29/22 0511 05/29/22 0739  BP: 112/64 110/60 119/71   Pulse: (!) 59 63 79   Resp:  18 17   Temp: 97.9 F (36.6 C) 97.6 F (36.4 C) 97.7 F (36.5 C)   TempSrc: Oral Oral Oral   SpO2:  100% 99%   Weight:    (!) 141.7 kg  Height:       General: alert, cooperative, and no distress Lochia: appropriate Chest: clear, no increased work of breathing Uterine Fundus: firm Incision: N/A DVT Evaluation: No evidence of DVT seen on physical exam. Lower extremities: no edema noted. Labs: Lab Results  Component Value Date   WBC 19.4 (H) 05/28/2022   HGB 10.1 (L) 05/28/2022   HCT 29.9 (L) 05/28/2022   MCV 84.7 05/28/2022   PLT 218 05/28/2022      Latest Ref Rng & Units 05/28/2022    7:25 AM  CMP  Glucose 70 - 99 mg/dL 82   BUN 6 - 20 mg/dL 7   Creatinine 0.44 - 1.00 mg/dL 0.77   Sodium 135 - 145 mmol/L 133   Potassium 3.5 - 5.1 mmol/L 4.1   Chloride 98 - 111 mmol/L  105   CO2 22 - 32 mmol/L 22   Calcium 8.9 - 10.3 mg/dL 8.3    Edinburgh Score:    05/28/2022    1:04 AM  Edinburgh Postnatal Depression Scale Screening Tool  I have been able to laugh and see the funny side of things. 0  I have looked forward with enjoyment to things. 0  I have blamed myself unnecessarily when things went wrong. 1  I have been anxious or worried for no good reason. 1  I have felt scared or panicky for no good reason. 0  Things have been getting on top of me. 0  I have been so unhappy that I have had difficulty sleeping. 0  I have felt sad or miserable. 1  I have been so unhappy that I have been crying. 1  The thought of harming myself has occurred to me. 0  Edinburgh Postnatal Depression Scale Total 4      After visit meds:  Allergies as of 05/29/2022   No Known Allergies      Medication List     STOP taking these medications    aspirin EC 81 MG tablet   famotidine 20 MG tablet Commonly known as: PEPCID   valACYclovir 500 MG tablet Commonly known as: VALTREX       TAKE these medications    ferrous sulfate 325 (65 FE) MG tablet Commonly known as: FerrouSul Take 1 tablet (325 mg total) by mouth every other day.   furosemide 20 MG tablet Commonly known as: LASIX Take 1 tablet (20 mg total) by mouth 2 (two) times daily for 7 days. After this, ok to go back to once a week dose What changed:  when to take this additional instructions   metoprolol succinate 25 MG 24 hr tablet Commonly known as: Toprol XL Take 1 tablet (25 mg total) by mouth daily. Start taking on: May 30, 2022   montelukast 10 MG tablet Commonly known as: Singulair Take 1 tablet (10 mg total) by mouth at bedtime.   potassium chloride 10 MEQ tablet Commonly known as: KLOR-CON TAKE 1 TABLET BY MOUTH ONCE A WEEK   Prenatal 27-1 MG Tabs Take 1 tablet by mouth daily.   senna-docusate 8.6-50 MG tablet Commonly known as: Senokot-S Take 2 tablets by mouth daily.        Discharge home in stable condition Infant Feeding: Bottle and Breast Infant Disposition:home with mother Discharge instruction: per After Visit Summary and Postpartum booklet. Activity: Advance as tolerated. Pelvic rest for 6 weeks.  Diet: low salt diet Future Appointments: Future Appointments  Date Time Provider [place]  06/25/2022 10:15 AM Griffin Basil, MD [facility]  07/01/2022  2:00 PM [facility] NURSE [facility]  07/31/2022  1:20 PM Tobb, Godfrey Pick, DO [facility] None   Follow up Visit:  The following message was sent to [facility] by Mikki Santee, MD  Please schedule this patient for a In person postpartum visit in 4 weeks with the following provider: MD. Additional Postpartum F/U:BP check 1 week  High risk  pregnancy complicated by:  history of IUFD<, cardiomyopathy Delivery mode:  Vaginal, Spontaneous  Anticipated Birth Control:  Nexplanon inpatient.  Liliane Channel MD MPH OB Fellow, [place] for [place] 05/29/2022

## 2022-05-27 NOTE — Progress Notes (Signed)
Labor Progress Note  Jamie Gates is a 22 y.o. G2P0100 at [redacted]w[redacted]d presented for IOL  cardiomyopathy, MR   S: patient is doing well. Just woke up. Planning to start nipple stimulation. Contractions are about 6 mins apart  O:  BP 108/65   Pulse 78   Temp 98 F (36.7 C) (Oral)   Resp 16   Ht 5\' 11"  (1.803 m)   Wt (!) 147.1 kg   LMP 09/04/2021 (Exact Date)   SpO2 100%   BMI 45.24 kg/m   EFM: 125 bpm, moderate variability, +accels, variable decels.  Contractions: averageing 100 - 150 MVU in   CVE: Dilation: 8 Effacement (%): 80, 90 Cervical Position: Posterior Station: 0, Plus 1 Presentation: Vertex Exam by:: Ignacia Felling, RNC   A&P: 23 y.o. G2P0100 [redacted]w[redacted]d  here for IOL as above  #Labor: s/p cyto, FB, AROM. Not tolerant of pitocin even on the lowest dose due to FHR decels. Has had terbutaline X 2 doses in this labor. Currently laboring spontaneously. ~ 100 - 150 MVU on average.  She will start nipple stimulation to see if it helps increase contractions.  #Pain: Family/Friend support and Epidural #FWB: CAT 2 due to recurrent variable decels. However has excellent variability, and spontaneous accels, so overall reassuring.  #GBS negative #Cardiomyopathy/MR/HFrEF (EF 40-45%): Metoprolol continued, lasix/ASA held. Plan to restart GDMT pp.   Verl Whitmore Sherrilyn Rist, MD [place] Fellow, Faculty practice [place] for [place] 05/27/22  11:11 AM

## 2022-05-27 NOTE — Progress Notes (Signed)
Patient ID: Jamie Gates, female   DOB: 02-12-2001, 22 y.o.   MRN: [id number] Had several repetitive late/variable decelerations  Pitocin turned off Position changed  Vitals:   05/26/22 2130 05/26/22 2132 05/26/22 2202 05/26/22 2232  BP:  (!) 141/67 (!) 128/59 122/63  Pulse:  70 77 81  Resp:      Temp: 98 F (36.7 C)     TempSrc: Oral     SpO2:      Weight:      Height:       Ordered 280ml bolus and Start Amnioinfusion Will hold off on Terbutaline due to cardiomyopathy   Now has had two UCs without decels WIll monitor

## 2022-05-27 NOTE — Progress Notes (Signed)
Patient given single hand operated breast pump. Instruction given for how to use this pump for nipple stimulation for labor induction. RN demonstrated for patient; patient voiced understanding and demonstrated use for RN.

## 2022-05-27 NOTE — Progress Notes (Signed)
Labor Progress Note  Jamie Gates is a 22 y.o. G2P0100 at [redacted]w[redacted]d presented for IOL  cardiomyopathy, MR   S: called to bedside for repeat late decelerations with contractions. S/p terbutaline x1, amnioinfusion, 250cc IVB and maternal repositioning. Pitocin was stopped, was on 67mu/min  O:  BP 131/72 (BP Location: Right Arm)   Pulse 100   Temp 98.1 F (36.7 C) (Oral)   Resp 18   Ht 5\' 11"  (1.803 m)   Wt (!) 147.1 kg   LMP 09/04/2021 (Exact Date)   SpO2 100%   BMI 45.24 kg/m  EFM: 135 bpm/Moderate variability/ 15x15 accels/ Late decels  CVE: Dilation: 5 Effacement (%): 80 Cervical Position: Posterior Station: 0 Presentation: Vertex Exam by:: Ronalee Belts RN   A&P: [name] [redacted]w[redacted]d  here for IOL as above  #Labor: contractions ongoing with decels after terbutaline. Added a second dose of terbutaline with some improvement/early decelerations. Plan to monitor off pitocin, will recheck SVE in 1-2 hours as long as FHT allows.  #Pain: Family/Friend support and Epidural #FWB: CAT 1 #GBS negative #Cardiomyopathy/MR/HFrEF (EF 40-45%): Metoprolol continued, lasix/ASA held. Plan to restart GDMT pp.   [name], [place] Fellow, Faculty practice [place] for [place] 05/27/22  1:35 AM

## 2022-05-27 NOTE — Progress Notes (Signed)
Labor Progress Note  Jamie Gates is a 22 y.o. G2P0100 at [redacted]w[redacted]d presented for IOL  cardiomyopathy, MR   S: difficulty tracing FHT, seems similar to mother's HR. spontaneous ctx q 3-4 min  O:  BP 123/68 (BP Location: Right Arm)   Pulse (!) 114   Temp 98.1 F (36.7 C) (Oral)   Resp 18   Ht 5\' 11"  (1.803 m)   Wt (!) 147.1 kg   LMP 09/04/2021 (Exact Date)   SpO2 100%   BMI 45.24 kg/m  EFM: 135 bpm/Moderate variability/ 15x15 accels/ None decels  CVE: Dilation: 9 Effacement (%): 100 Cervical Position: Posterior Station: -1, 0 Presentation: Vertex Exam by:: Mercado MD   A&P: 22 y.o. G2P0100 [redacted]w[redacted]d  here for IOL as above  #Labor: FSE placed by nursing. FHT confirmed on [place] per nursing. off pitocin, pt progressing well on spontaneous contractions. In throne position. #Pain: Family/Friend support and Epidural #FWB: CAT 1 #GBS negative #Cardiomyopathy/MR/HFrEF (EF 40-45%): Metoprolol continued, lasix/ASA held. Plan to restart GDMT pp.   [name], [place] Fellow, Faculty practice [place] for [place] 05/27/22  3:02 AM

## 2022-05-27 NOTE — Progress Notes (Signed)
Labor Progress Note  Jamie Gates is a 22 y.o. G2P0100 at [redacted]w[redacted]d presented for IOL  cardiomyopathy, MR   S: late decelerations again while on 31mu of pitocin.   O:  BP 126/71 (BP Location: Right Arm)   Pulse 89   Temp 98.3 F (36.8 C) (Oral)   Resp 18   Ht 5\' 11"  (1.803 m)   Wt (!) 147.1 kg   LMP 09/04/2021 (Exact Date)   SpO2 100%   BMI 45.24 kg/m  EFM: 135 bpm/Moderate variability/ 15x15 accels/ Late decels  CVE: Dilation: 7 Effacement (%): 90 Cervical Position: Posterior Station: 0 Presentation: Vertex Exam by:: Ronalee Belts RN   A&P: 22 y.o. G2P0100 [redacted]w[redacted]d  here for IOL as above  #Labor: stopped pit. Maternal position changes, improved FHT #Pain: Family/Friend support and Epidural #FWB: CAT 1 after interventions #GBS negative #Cardiomyopathy/MR/HFrEF (EF 40-45%): Metoprolol continued, lasix/ASA held. Plan to restart GDMT pp.   [name], [place] Fellow, Faculty practice [place] for [place] 05/27/22  6:24 AM

## 2022-05-27 NOTE — Progress Notes (Signed)
Labor Progress Note  Jamie Doleman is a [name] at [redacted]w[redacted]d presented for IOL cardiomyopathy, MR   S: doing well. Making progress. Intermittent nipple stimulation.  O:  BP (!) 117/58   Pulse 63   Temp 98.9 F (37.2 C) (Oral)   Resp 16   Ht 5\' 11"  (1.803 m)   Wt (!) 147.1 kg   LMP 09/04/2021 (Exact Date)   SpO2 100%   BMI 45.24 kg/m   EFM: 130 bpm, moderate variability, + accelerations, no decelerations  Contractions: averageing 80 - 120 MVU  CVE: Dilation: Lip/rim Effacement (%): 90 Cervical Position: Posterior Station: Plus 1 Presentation: Vertex Exam by:: Ignacia Felling, RNC   A&P: 22 y.o. G2P0100 [redacted]w[redacted]d  here for IOL as above  #Labor: s/p cyto, FB, AROM. Not tolerant of pitocin even on the lowest dose due to FHR decels. Has had terbutaline X 2 doses in this labor. Currently laboring spontaneously. ~ 100 - 150 MVU on average.  Making slow but steady progress with intermittent nipple stimulation. Her cervix is close to full dilation at this time. Plan to continue monitoring, allowing an abundance of time to reach full dilation and allow for descent, given inadequate contractions, as long as FHR is reassuring.   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 Plan to restart GDMT pp.   [name] Sherrilyn Rist, MD [place] Fellow, Faculty practice [place] for [place] 05/27/22  3:57 PM

## 2022-05-28 LAB — BASIC METABOLIC PANEL
Anion gap: 6 (ref 5–15)
BUN: 7 mg/dL (ref 6–20)
CO2: 22 mmol/L (ref 22–32)
Calcium: 8.3 mg/dL — ABNORMAL LOW (ref 8.9–10.3)
Chloride: 105 mmol/L (ref 98–111)
Creatinine, Ser: 0.77 mg/dL (ref 0.44–1.00)
GFR, Estimated: >60 mL/min (ref >60)
Glucose, Bld: 82 mg/dL (ref 70–99)
Potassium: 4.1 mmol/L (ref 3.5–5.1)
Sodium: 133 mmol/L — ABNORMAL LOW (ref 135–145)

## 2022-05-28 LAB — CBC
HCT: 29.9 % — ABNORMAL LOW (ref 36.0–46.0)
Hemoglobin: 10.1 g/dL — ABNORMAL LOW (ref 12.0–15.0)
MCH: 28.6 pg (ref 26.0–34.0)
MCHC: 33.8 g/dL (ref 30.0–36.0)
MCV: 84.7 fL (ref 80.0–100.0)
Platelets: 218 10*3/uL (ref 150–400)
RBC: 3.53 MIL/uL — ABNORMAL LOW (ref 3.87–5.11)
RDW: 13.5 % (ref 11.5–15.5)
WBC: 19.4 10*3/uL — ABNORMAL HIGH (ref 4.0–10.5)
nRBC: 0 % (ref 0.0–0.2)

## 2022-05-28 MED ORDER — RHO D IMMUNE GLOBULIN 1500 UNIT/2ML IJ SOSY
300.0000 ug | PREFILLED_SYRINGE | Freq: Once | INTRAMUSCULAR | Status: AC
  Administered 2022-05-28: 300 ug via INTRAMUSCULAR
  Filled 2022-05-28: qty 2

## 2022-05-28 MED ORDER — FUROSEMIDE 20 MG PO TABS
20.0000 mg | ORAL_TABLET | Freq: Two times a day (BID) | ORAL | Status: DC
  Administered 2022-05-28 – 2022-05-29 (×4): 20 mg via ORAL
  Filled 2022-05-28 (×4): qty 1

## 2022-05-28 NOTE — Anesthesia Postprocedure Evaluation (Signed)
Anesthesia Post Note  Patient: Jamie Gates  Procedure(s) Performed: AN AD HOC LABOR EPIDURAL     Patient location during evaluation: Mother Baby Anesthesia Type: Epidural Level of consciousness: awake and alert and oriented Pain management: satisfactory to patient Vital Signs Assessment: post-procedure vital signs reviewed and stable Respiratory status: respiratory function stable Cardiovascular status: stable Postop Assessment: no headache, no backache, epidural receding, patient able to bend at knees, no signs of nausea or vomiting, adequate PO intake and able to ambulate Anesthetic complications: no   No notable events documented.  Last Vitals:  Vitals:   05/28/22 0104 05/28/22 0445  BP: (!) 104/54 102/65  Pulse: 63 67  Resp: 16 16  Temp: 36.4 C 36.5 C  SpO2: 98% 98%    Last Pain:  Vitals:   05/28/22 0530  TempSrc:   PainSc: 0-No pain   Pain Goal:                   [name]

## 2022-05-28 NOTE — Progress Notes (Signed)
Circumcision Consent  Discussed with mom at bedside about circumcision.   Circumcision is a surgery that removes the skin that covers the tip of the penis, called the "foreskin." Circumcision is usually done when a boy is between 34 and 68 days old, sometimes up to 58-46 weeks old.  The most common reasons boys are circumcised include for cultural/religious beliefs or for parental preference (potentially easier to clean, so baby looks like daddy, etc).  There may be some medical benefits for circumcision:   Circumcised boys seem to have slightly lower rates of: ? Urinary tract infections (per the American Academy of Pediatrics an uncircumcised boy has a 1/100 chance of developing a UTI in the first year of life, a circumcised boy at a 03/998 chance of developing a UTI in the first year of life- a 10% reduction) ? Penis cancer (typically rare- an uncircumcised female has a 1 in 100,000 chance of developing cancer of the penis) ? Sexually transmitted infection (in endemic areas, including HIV, HPV and Herpes- circumcision does NOT protect against gonorrhea, chlamydia, trachomatis, or syphilis) ? Phimosis: a condition where that makes retraction of the foreskin over the glans impossible (0.4 per 1000 boys per year or 0.6% of boys are affected by their 15th birthday)  Boys and men who are not circumcised can reduce these extra risks by: ? Cleaning their penis well ? Using condoms during sex  What are the risks of circumcision?  As with any surgical procedure, there are risks and complications. In circumcision, complications are rare and usually minor, the most common being: ? Bleeding- risk is reduced by holding each clamp for 30 seconds prior to a cut being made, and by holding pressure after the procedure is done ? Infection- the penis is cleaned prior to the procedure, and the procedure is done under sterile technique ? Damage to the urethra or amputation of the penis  How is circumcision done  in baby boys?  The baby will be placed on a special table and the legs restrained for their safety. Numbing medication is injected into the penis, and the skin is cleansed with betadine to decrease the risk of infection.   What to expect:  The penis will look red and raw for 5-7 days as it heals. We expect scabbing around where the cut was made, as well as clear-pink fluid and some swelling of the penis right after the procedure. If your baby's circumcision starts to bleed or develops pus, please contact your pediatrician immediately.  All questions were answered and mother consented.  Carrel Leather "Darlyne Russian, M.D. PGY-2 Family Medicine Visiting Resident Faculty Practice 05/28/2022 8:00 AM

## 2022-05-28 NOTE — Lactation Note (Signed)
This note was copied from a baby's chart. Lactation Consultation Note  Patient Name: Jamie Gates [id number] Date: 05/28/2022 Age:22 hours Reason for consult: Initial assessment;1st time breastfeeding;Late-preterm 34-36.6wks;Infant < 6lbs  P1, Unwrapped baby to wake.  Assisted with latching in football hold. Baby active for 10 min. Encouraged mother to support breast from underneath and bring baby deep on breast.  Baby breastfed for 10 min and will be supplemented with donor milk after.  Goal 12-14 ml.  Increase per day of life and as baby desires. Plan: Keep baby STS as much as possible  Offer breast when baby cues that he/she is hungry, or awaken baby for feeding at 3 hrs.  Breast feed baby, asking for help prn.  Limit to 30 mins so not to overtire baby. If baby does not latch after 10 min of attempt - give supplemental breastmilk/donor milk.  Slow flow nipple bottle is an option.   Pump both breasts 15-20 minutes on initiation setting, adding breast massage and hand expression to collect as much colostrum as possible to feed baby.  Feed baby 12-14 ml EBM+/DMafter breastfeeding per LPTI volume guidelines increasing per day of life and as baby desires.     Maternal Data Has patient been taught Hand Expression?: Yes Does the patient have breastfeeding experience prior to this delivery?: No  Feeding Mother's Current Feeding Choice: Breast Milk and Donor Milk  LATCH Score Latch: Repeated attempts needed to sustain latch, nipple held in mouth throughout feeding, stimulation needed to elicit sucking reflex.  Audible Swallowing: A few with stimulation  Type of Nipple: Everted at rest and after stimulation  Comfort (Breast/Nipple): Soft / non-tender  Hold (Positioning): Assistance needed to correctly position infant at breast and maintain latch.  LATCH Score: 7   Interventions Interventions: Breast feeding basics reviewed;Assisted with latch;Skin to skin;Hand express;Adjust  position;Support pillows;DEBP;Education;Pace feeding  Discharge Pump: Stork Pump [employer] pump referral emailed and [facility] loaner pump paperwork sent)  Consult Status Consult Status: Follow-up Date: 05/29/22 Follow-up type: In-patient    Jamie Gates [facility] 05/28/2022, 3:27 PM

## 2022-05-28 NOTE — Progress Notes (Signed)
POSTPARTUM PROGRESS NOTE  Post Partum Day 1  Subjective:  Jamie Gates is a 22 y.o. G2P1101 s/p SVD at [redacted]w[redacted]d.  She reports she is doing well. No acute events overnight. She denies any problems with ambulating, voiding or po intake. Denies nausea or vomiting.  Pain is moderately controlled - ibuprofen is helpful.  Lochia is decreasing.  She does not have a diagnosis of asthma. She was prescribed montelukast during pregnancy but she does not take them. She does not have wheezing.  She does not have any LE edema, shortness of breath, chest pains at this time  Objective: Blood pressure 102/65, pulse 67, temperature 97.7 F (36.5 C), temperature source Oral, resp. rate 16, height 5\' 11"  (1.803 m), weight (!) 147.1 kg, last menstrual period 09/04/2021, SpO2 98 %, unknown if currently breastfeeding.  Physical Exam:  General: alert, cooperative and no distress Chest: no respiratory distress, CTAB in the posterior lung fields Heart:regular rate, distal pulses intact. Normal S1, S2 Uterine Fundus: firm, appropriately tender DVT Evaluation: No calf swelling or tenderness Extremities: trace pitting edema. Skin: warm, dry  Recent Labs    05/26/22 0844 05/28/22 0313  HGB 10.7* 10.1*  HCT 31.4* 29.9*    Assessment/Plan: Jamie Gates is a 22 y.o. G2P1101 s/p SVD at [redacted]w[redacted]d   Cardiomyopathy, Mitral Regurgitation Unclear etiology, likely nonischemic. Most recent EF Jan 2024 showing EF 41%  - continue home meds metoprolol XL 25mg  daily  - lasix 20mg  daily --> BID today  - strict I's O's, daily standing weight, 1/5L fluid restriction  - daily BMP  PPD#1 - Doing well  Routine postpartum care  Contraception: desires nexplanon, has medicaid. Will get prior to discharge  Feeding: both Dispo: Plan for discharge tomorrow.   LOS: 2 days    Jamie Gates "Darlyne Russian, M.D. PGY-2 Family Medicine Visiting Resident Faculty Practice 05/28/2022 6:58 AM

## 2022-05-28 NOTE — Social Work (Signed)
CSW received consult for hx of Depression.  CSW met with MOB to offer support and complete assessment. CSW entered the room and observed FOB at bedside and MOB resting in bed with infant skin to skin. CSW introduced self, and offered privacy, MOB allowed FOB to remain in the room. CSW explained role and reason for visit, MOB was agreeable to visit. CSW inquired about how MOB was feeling, MOB reported good. CSW inquired about MOB MH hx, MOB reported she was diagnosed with Adjustment Disorder with depressed mood in 2022 after the loss of their first child. CSW expressed condolences. MOB reported a stable mood throughout this pregnancy. MOB reported she does not take medication but has been referred to a therapist  through her OB.  CSW assessed for safety, MOB denied any SI or HI. CSW provided education regarding the baby blues period vs. perinatal mood disorders, discussed treatment and gave resources for mental health follow up if concerns arise.  CSW recommends self-evaluation during the postpartum time period using the New Mom Checklist from Postpartum Progress and encouraged MOB to contact a medical professional if symptoms are noted at any time.  MOB identified her mom and FOB as her supports.  CSW provided review of Sudden Infant Death Syndrome (SIDS) precautions. MOB reported she has all necessary items for the infant including a bassinet and car seat for the infant. CSW identifies no further need for intervention and no barriers to discharge at this time.  Jamie Gates, Jamie Gates Social Worker [phone]

## 2022-05-29 ENCOUNTER — Ambulatory Visit (HOSPITAL_COMMUNITY): Payer: Self-pay

## 2022-05-29 ENCOUNTER — Encounter (HOSPITAL_COMMUNITY): Payer: Self-pay | Admitting: Obstetrics and Gynecology

## 2022-05-29 ENCOUNTER — Other Ambulatory Visit (HOSPITAL_COMMUNITY): Payer: Self-pay

## 2022-05-29 DIAGNOSIS — Z30017 Encounter for initial prescription of implantable subdermal contraceptive: Secondary | ICD-10-CM

## 2022-05-29 HISTORY — PX: NEXPLANON TRAY: NUR84248

## 2022-05-29 LAB — RH IG WORKUP (INCLUDES ABO/RH)
Fetal Screen: NEGATIVE
Gestational Age(Wks): 37.6
Unit division: 0

## 2022-05-29 MED ORDER — FUROSEMIDE 20 MG PO TABS
20.0000 mg | ORAL_TABLET | Freq: Two times a day (BID) | ORAL | 0 refills | Status: DC
  Filled 2022-05-29: qty 14, 7d supply, fill #0

## 2022-05-29 MED ORDER — METOPROLOL SUCCINATE ER 25 MG PO TB24
25.0000 mg | ORAL_TABLET | Freq: Every day | ORAL | 0 refills | Status: DC
  Filled 2022-05-29: qty 60, 60d supply, fill #0

## 2022-05-29 MED ORDER — LIDOCAINE HCL 1 % IJ SOLN
0.0000 mL | Freq: Once | INTRAMUSCULAR | Status: AC | PRN
  Administered 2022-05-29: 20 mL via INTRADERMAL
  Filled 2022-05-29: qty 20

## 2022-05-29 MED ORDER — ETONOGESTREL 68 MG ~~LOC~~ IMPL
68.0000 mg | DRUG_IMPLANT | Freq: Once | SUBCUTANEOUS | Status: AC
  Administered 2022-05-29: 68 mg via SUBCUTANEOUS
  Filled 2022-05-29: qty 1

## 2022-05-29 MED ORDER — SENNOSIDES-DOCUSATE SODIUM 8.6-50 MG PO TABS
2.0000 | ORAL_TABLET | ORAL | 0 refills | Status: DC
  Filled 2022-05-29: qty 30, 15d supply, fill #0

## 2022-05-29 NOTE — Procedures (Signed)
     Nexplanon Insertion Procedure Patient identified, informed consent performed, consent signed.   Patient does understand that irregular bleeding is a very common side effect of this medication. She was advised to have backup contraception for one week after placement. Pregnancy test in clinic today was negative.  Appropriate time out taken.    Patient's left arm was prepped and draped in the usual sterile fashion. The ruler used to measure and mark insertion area.  Patient was prepped with alcohol swab and then injected with 3 ml of 1% lidocaine.  She was prepped with betadine, Nexplanon removed from packaging,  Device confirmed in needle, then inserted full length of needle and withdrawn per handbook instructions. Nexplanon was able to palpated in the patient's arm; patient palpated the insert herself. There was minimal blood loss.  Patient insertion site covered with guaze and a pressure bandage to reduce any bruising.    The patient tolerated the procedure well and was given post procedure instructions.   Lot No: [id number] Exp Date: 01/2024  Liliane Channel MD MPH OB Fellow, [place] for [place] 05/29/2022

## 2022-05-29 NOTE — Discharge Instructions (Addendum)
-   Continue your prenatal vitamins especially if breastfeeding - Try to eat iron rich food. And please stick with a low salt diet - Take over the counter tylenol (500mg ) or ibuprofen (200mg ) three times a day as needed for cramping/pain. - Take water pill (lasix) as prescribed for a total of 5 days and then can go back to once a week until you see cardiology postpartum - follow up in clinic in 1 week for a blood pressure check and a mood check, and in 4-6 weeks as scheduled for your regular post partum visit. - Please come back to [facility] if you notice persistently elevated blood pressures or you start to have a headache, that doesn't get better with medications (tylenol and ibuprofen), rest (4hrs of sleep) and drinking water.

## 2022-05-29 NOTE — Lactation Note (Signed)
This note was copied from a baby's chart. Lactation Consultation Note  Patient Name: Jamie Gates [id number] Date: 05/29/2022 Age:22 hours  Reason for consult: Follow-up assessment;Early term 37-38.6wks;1st time breastfeeding;Primapara;Infant < 6lbs  P1, ETI [redacted]w[redacted]d, 4% weight loss, today's weight 5lb 9oz  Mother states she latched baby this morning and he stayed on the breast for 10 minutes. She reports trying to latch before feedings but he has been too sleepy. Infant is getting donor breast milk. Mother says she is pumping every 3 hrs for 15 minutes and has not expressed milk yet. Encouragement to keep latching baby, requesting help with latch, and pumping to stimulate milk production.   Mother aware that [facility] will not be available to take home and she wants to introduce formula before discharge if she is not collecting her own milk. Mother will ask RN for formula when needed.  Infant currently sleeping on mom's chest skin to skin after his circumcision.  Maternal Data Has patient been taught Hand Expression?: Yes Does the patient have breastfeeding experience prior to this delivery?: No  Feeding Mother's Current Feeding Choice: Breast Milk and Donor Milk Nipple Type: Extra Slow Flow   Interventions Interventions: Education  Discharge Pump: Stork Pump  Consult Status Consult Status: Follow-up Date: 05/30/22 Follow-up type: In-patient    Stana Bunting M 05/29/2022, 7:19 PM

## 2022-05-30 ENCOUNTER — Ambulatory Visit (HOSPITAL_COMMUNITY): Payer: Self-pay

## 2022-05-30 LAB — TYPE AND SCREEN
ABO/RH(D): B NEG
Antibody Screen: POSITIVE
Unit division: 0
Unit division: 0

## 2022-05-30 LAB — BPAM RBC
Blood Product Expiration Date: 202404042359
Blood Product Expiration Date: 202404102359
Unit Type and Rh: 1700
Unit Type and Rh: 1700

## 2022-05-30 NOTE — Lactation Note (Signed)
This note was copied from a baby's chart. Lactation Consultation Note  Patient Name: Jamie Gates [id number] Date: 05/30/2022 Age:22 hours Reason for consult: Early term 37-38.6wks;Other (Comment) (discharge)  [place] student entered room to provide mother of baby with education for discharge. Mother stated that breastfeeding was not going too well, baby will latch fine but she is not producing any milk. Explained to mother that her current medication (LASIX) could be why her milk has not came in yet. Mother and support persons verbally understood. Mother of baby expressed that she cramps when pumping, discussed why that happens.  Educated mother of baby on engorgement and mastitis prevention and treatment, outpatient resources and the significance of stimulating the breast.    Emphasized rest, diet and hydration.   Plan: To continue pumping q3hr for 15 minutes. When milk comes in to lessen pump sessions and to pump to relieve discomfort. Continue to provide formula to infant per guidelines.    Maternal Data 82 year old, G2P1. Mother of baby is on Furosamide (LASIX) for 7 days then will take it once a week.  Does the patient have breastfeeding experience prior to this delivery?: Yes  Feeding Mother's Current Feeding Choice: Breast Milk and Formula  Interventions Interventions: Education  Discharge Discharge Education: Engorgement and breast care;Outpatient recommendation;Warning signs for feeding baby Pump: Personal;DEBP  Consult Status Consult Status: Complete Date: 05/30/22 Follow-up type: Out-patient    Telena Peyser 05/30/2022, 12:54 PM

## 2022-06-06 ENCOUNTER — Telehealth (HOSPITAL_COMMUNITY): Payer: Self-pay | Admitting: *Deleted

## 2022-06-06 NOTE — Telephone Encounter (Signed)
Attempted hospital discharge follow-up call. Left message for patient to return RN call with any questions or concerns. Deforest Hoyles, RN, 06/06/22, [phone]

## 2022-06-25 ENCOUNTER — Ambulatory Visit (INDEPENDENT_AMBULATORY_CARE_PROVIDER_SITE_OTHER): Payer: Medicaid Other | Admitting: Obstetrics and Gynecology

## 2022-06-25 ENCOUNTER — Telehealth: Payer: Self-pay

## 2022-06-25 ENCOUNTER — Other Ambulatory Visit: Payer: Self-pay

## 2022-06-25 ENCOUNTER — Encounter: Payer: Self-pay | Admitting: Obstetrics and Gynecology

## 2022-06-25 DIAGNOSIS — N83201 Unspecified ovarian cyst, right side: Secondary | ICD-10-CM

## 2022-06-25 NOTE — Progress Notes (Signed)
Post Partum Visit Note  Jamie Gates is a 22 y.o. G16P1101 female who presents for a postpartum visit. She is 4 weeks postpartum following a normal spontaneous vaginal delivery.  I have fully reviewed the prenatal and intrapartum course. The delivery was at 37/6 gestational weeks.  Anesthesia: epidural. Postpartum course has been uncomplicated. Baby is doing well. Baby is feeding by bottle - [employer] Good Start. Bleeding no bleeding. Bowel function is normal. Bladder function is normal. Patient is not sexually active. Contraception method is Nexplanon. Postpartum depression screening: negative.   The pregnancy intention screening data noted above was reviewed. Potential methods of contraception were discussed. The patient elected to proceed with nexplanon, already placed.   Edinburgh Postnatal Depression Scale - 06/25/22 1033       Edinburgh Postnatal Depression Scale:  In the Past 7 Days   I have been able to laugh and see the funny side of things. 0    I have looked forward with enjoyment to things. 0    I have blamed myself unnecessarily when things went wrong. 2    I have been anxious or worried for no good reason. 2    I have felt scared or panicky for no good reason. 1    Things have been getting on top of me. 1    I have been so unhappy that I have had difficulty sleeping. 0    I have felt sad or miserable. 0    I have been so unhappy that I have been crying. 0    The thought of harming myself has occurred to me. 0    Edinburgh Postnatal Depression Scale Total 6             Health Maintenance Due  Topic Date Due   COVID-19 Vaccine (1) Never done    The following portions of the patient's history were reviewed and updated as appropriate: allergies, current medications, past family history, past medical history, past social history, past surgical history, and problem list.  Review of Systems Pertinent items are noted in HPI.  Objective:  BP (!) 99/54   Pulse 72    Ht  (1.803 m)   Wt (!) 309 lb 12.8 oz (140.5 kg)   LMP 09/04/2021 (Exact Date)   Breastfeeding No   BMI 43.21 kg/m    General:  alert, no distress, and moderately obese   Breasts:  not indicated  Lungs: clear to auscultation bilaterally  Heart:  regular rate and rhythm  Abdomen: soft, non-tender; bowel sounds normal; no masses,  no organomegaly   Wound N/a  GU exam:  not indicated       Assessment:    Encounter for postpartum care  normal postpartum exam.   Plan:   Essential components of care per ACOG recommendations:  1.  Mood and well being: Patient with negative depression screening today. Reviewed local resources for support.  - Patient tobacco use? No.   - hx of drug use? No.    2. Infant care and feeding:  -Patient currently breastmilk feeding? No.  -Social determinants of health (SDOH) reviewed in EPIC. No concerns.  3. Sexuality, contraception and birth spacing - Patient does not want a pregnancy in the next year.  Desired family size is 3 children.  - Reviewed reproductive life planning. Reviewed contraceptive methods based on pt preferences and effectiveness.  Patient desired Hormonal Implant today.   - Discussed birth spacing of 18 months  4. Sleep and fatigue -Encouraged  family/partner/community support of 4 hrs of uninterrupted sleep to help with mood and fatigue  5. Physical Recovery  - Discussed patients delivery and complications. She describes her labor as good. - Patient had a Vaginal, no problems at delivery. Patient had a  small right labial  laceration. Perineal healing reviewed. Patient expressed understanding - Patient has urinary incontinence? No. - Patient is safe to resume physical and sexual activity  6.  Health Maintenance - HM due items addressed Yes - Last pap smear  Diagnosis  Date Value Ref Range Status  01/05/2022 (A)  Final   - Atypical squamous cells of undetermined significance (ASC-US)   Pap smear not done at today's  visit.  -Breast Cancer screening indicated? No.   7. Chronic Disease/Pregnancy Condition follow up:  cardiomyopathy , pt has follow with cardiology 07/31/22 Possible right ovarian cyst, check pelvic ultrasound in 3-4 months, consider intervention if still present  - PCP follow up  Warden Fillers, MD [facility] for [facility], [facility]

## 2022-06-25 NOTE — Telephone Encounter (Signed)
Called Pt to advise of [place] appt scheduled for 07/02/22@ 2:30 p, no answer, left VM.

## 2022-07-01 ENCOUNTER — Ambulatory Visit: Payer: Medicaid Other

## 2022-07-01 ENCOUNTER — Other Ambulatory Visit: Payer: Self-pay

## 2022-07-01 VITALS — BP 102/57 | HR 68 | Ht 71.0 in | Wt 308.9 lb

## 2022-07-01 DIAGNOSIS — Z0130 Encounter for examination of blood pressure without abnormal findings: Secondary | ICD-10-CM

## 2022-07-01 NOTE — Progress Notes (Signed)
Pt here today for BP check.  BP 102/57  Pt denies headache and changes in vision.  Pt advised to continue taking Metoprolol as prescribed as she has an appt with Dr. Servando Salina on 07/31/22.  Pt verbalized understanding with further questions.   Leonette Nutting  07/01/22

## 2022-07-02 ENCOUNTER — Ambulatory Visit (HOSPITAL_COMMUNITY)
Admission: RE | Admit: 2022-07-02 | Discharge: 2022-07-02 | Disposition: A | Discharge status: Home / Self Care | Payer: Medicaid Other | Source: Ambulatory Visit | Attending: Obstetrics and Gynecology | Admitting: Obstetrics and Gynecology

## 2022-07-02 DIAGNOSIS — N83291 Other ovarian cyst, right side: Secondary | ICD-10-CM | POA: Diagnosis not present

## 2022-07-02 DIAGNOSIS — N83201 Unspecified ovarian cyst, right side: Secondary | ICD-10-CM

## 2022-07-13 ENCOUNTER — Ambulatory Visit: Payer: Self-pay | Admitting: Student

## 2022-07-31 ENCOUNTER — Encounter: Payer: Self-pay | Admitting: Cardiology

## 2022-07-31 ENCOUNTER — Ambulatory Visit (INDEPENDENT_AMBULATORY_CARE_PROVIDER_SITE_OTHER): Payer: Medicaid Other | Admitting: Cardiology

## 2022-07-31 VITALS — BP 110/70 | HR 68 | Ht 71.0 in | Wt 308.0 lb

## 2022-07-31 DIAGNOSIS — Z01812 Encounter for preprocedural laboratory examination: Secondary | ICD-10-CM | POA: Diagnosis not present

## 2022-07-31 DIAGNOSIS — I340 Nonrheumatic mitral (valve) insufficiency: Secondary | ICD-10-CM

## 2022-07-31 DIAGNOSIS — R0989 Other specified symptoms and signs involving the circulatory and respiratory systems: Secondary | ICD-10-CM | POA: Diagnosis not present

## 2022-07-31 NOTE — Progress Notes (Signed)
Cardio-Obstetrics Clinic  New Evaluation  Date:  08/01/2022   ID:  Jamie Gates, DOB 21-Jan-2001, MRN [id number]  PCP:  Erick Alley, DO   [facility] Providers Cardiologist:  Thomasene Ripple, DO  Electrophysiologist:  None       Referring MD: Erick Alley, DO   Chief Complaint: " I am doing fine but have intermittent shortness of breath"  History of Present Illness:    Jamie Gates is a 22 y.o. female [G2P1101] who is being seen today for the evaluation of depressed ejection fraction/Dilated cardiomyopathy at the request of Erick Alley, DO.   I saw the patient on on 12/31/2000 at that time she was pregnant. Echo showed depressed ejection 40-45%. We discussed plans. Unfortunately she did not follow up. She tells me today that she lost her last baby girl at 34 weeks. After that she stopped taking the beta blocker.   Her echo in 03/2021 showed low normal EF.  Recent echo back down mildy depressed 40-45% 11/25/2021.  I saw her on 12/26/2021 at that time she was experiencing shortness of breath - I give her cautious diuretic. Kept her on her toprol and repeated her echo.   At her last visit we added Lasix  once weekely. Since her last visit she has not had any complaints.  I saw the patient on 05/15/2022 at that time she was [redacted] weeks pregnant. Since her last visit she has delivered a beautiful baby boy named Chosen.  Prior CV Studies Reviewed: The following studies were reviewed today:  11/25/2021 IMPRESSIONS   1. Left ventricular ejection fraction, by estimation, is 40 to 45%. The left ventricle has mildly decreased function. The left ventricle  demonstrates global hypokinesis. The left ventricular internal cavity size was moderately dilated. Left ventricular  diastolic parameters are indeterminate. The average left ventricular global longitudinal strain is -16.7 %. The global longitudinal strain is  abnormal.   2. Right ventricular systolic function is normal. The right  ventricular size is normal.   3. The mitral valve is normal in structure. Moderate to severe mitral valve regurgitation. No evidence of mitral stenosis.   4. The aortic valve is tricuspid. Aortic valve regurgitation is not visualized. No aortic stenosis is present.   5. The inferior vena cava is normal in size with greater than 50% respiratory variability, suggesting right atrial pressure of 3 mmHg.   FINDINGS   Left Ventricle: Left ventricular ejection fraction, by estimation, is 40 to 45%. The left ventricle has mildly decreased function. The left  ventricle demonstrates global hypokinesis. The average left ventricular global longitudinal strain is -16.7 %.  The global longitudinal strain is abnormal. The left ventricular internal cavity size was moderately dilated. There is no left ventricular  hypertrophy. Left ventricular diastolic parameters are indeterminate.   Right Ventricle: The right ventricular size is normal. Right ventricular  systolic function is normal.   Left Atrium: Left atrial size was normal in size.   Right Atrium: Right atrial size was normal in size.   Pericardium: There is no evidence of pericardial effusion.   Mitral Valve: The mitral valve is normal in structure. Moderate to severe  mitral valve regurgitation. No evidence of mitral valve stenosis. MV peak  gradient, 5.0 mmHg. The mean mitral valve gradient is 2.0 mmHg.   Tricuspid Valve: The tricuspid valve is normal in structure. Tricuspid  valve regurgitation is trivial. No evidence of tricuspid stenosis.   Aortic Valve: The aortic valve is tricuspid. Aortic valve regurgitation is  not visualized.  No aortic stenosis is present.   Pulmonic Valve: The pulmonic valve was normal in structure. Pulmonic valve  regurgitation is trivial. No evidence of pulmonic stenosis.   Aorta: The aortic root is normal in size and structure.   Venous: The inferior vena cava is normal in size with greater than 50%   respiratory variability, suggesting right atrial pressure of 3 mmHg.   IAS/Shunts: No atrial level shunt detected by color flow Doppler.      Past Medical History:  Diagnosis Date   Abnormal antibody titer (anti-Lewis antibodies) 09/30/2020   Adjustment disorder with depressed mood 09/30/2020   Cardiomegaly 12/18/2012   still working with cardiology-esp now with pregnancy   Cardiomyopathy, nonischemic (HCC) 02/05/2021   Chlamydia 09/23/2018   Treated 09/23/2018   Gonorrhea 09/23/2018   Treated 09/23/2018   Heart failure with reduced ejection fraction (HCC) 01/05/2022   Her echo in 03/2021 showed low normal EF.  Recent echo back down mildy depressed 40-45% 11/25/2021.    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   metoprolol succinate (TOPROL XL) 25 MG 24 hr tablet Take 1 tablet (25 mg total) by mouth daily.   potassium chloride (KLOR-CON) 10 MEQ tablet TAKE 1 TABLET BY MOUTH ONCE A WEEK   Prenatal 27-1 MG TABS Take 1 tablet by mouth daily.   senna-docusate (SENOKOT-S) 8.6-50 MG tablet Take 2 tablets by mouth daily.     Allergies:   Patient has no known allergies.   Social History   Socioeconomic History   Marital status: Single    Spouse name: Not on file   Number of children: Not on file   Years of education: Not on file   Highest education level: Not on file  Occupational History   Occupation: Conservation officer, nature    Comment:  [employer]  Tobacco Use   Smoking status: Former    Packs/day: 0    Types: Cigars, Cigarettes    Quit date: 09/03/2020    Years since quitting: 1.9   Smokeless tobacco: Never  Vaping Use   Vaping Use: Never used  Substance and Sexual Activity   Alcohol use: No   Drug use: Not Currently    Types: Marijuana   Sexual activity: Yes    Birth control/protection: None  Other Topics Concern   Not on file  Social History Narrative   Patient is living with her dad at this time in school still getting passing grades   Social Determinants of Health   Financial Resource Strain: Low Risk  (01/10/2021)   Overall Financial Resource Strain (CARDIA)    Difficulty of Paying  Living Expenses: Not hard at all  Food Insecurity: No Food Insecurity (05/26/2022)   Hunger Vital Sign    Worried About Running Out of Food in the Last Year: Never true    Ran Out of Food in the Last Year: Never true  Transportation Needs: No Transportation Needs (05/26/2022)   PRAPARE - Administrator, Civil Service (Medical): No    Lack of Transportation (Non-Medical): No  Physical Activity: Not on file  Stress: Not on file  Social Connections: Not on file      Family History  Problem Relation Age of Onset   Raynaud syndrome Mother    Diabetes Father    Hyperlipidemia Father    Hypertension Father    Asthma Neg Hx    Stroke Neg Hx       ROS:   Please see the history of present illness.      Review of Systems  Constitution: Negative for decreased appetite, fever and weight gain.  HENT: Negative for congestion, ear discharge, hoarse voice and sore throat.   Eyes: Negative for discharge, redness, vision loss in right eye and visual halos.  Cardiovascular: Reports shortness of breath on exertion.  Negative for chest pain,  leg swelling, orthopnea and palpitations.  Respiratory: Negative for cough, hemoptysis, shortness of breath and snoring.   Endocrine: Negative for heat intolerance and polyphagia.   Hematologic/Lymphatic: Negative for bleeding problem. Does not bruise/bleed easily.  Skin: Negative for flushing, nail changes, rash and suspicious lesions.  Musculoskeletal: Negative for arthritis, joint pain, muscle cramps, myalgias, neck pain and stiffness.  Gastrointestinal: Negative for abdominal pain, bowel incontinence, diarrhea and excessive appetite.  Genitourinary: Negative for decreased libido, genital sores and incomplete emptying.  Neurological: Negative for brief paralysis, focal weakness, headaches and loss of balance.  Psychiatric/Behavioral: Negative for altered mental status, depression and suicidal ideas.  Allergic/Immunologic: Negative for HIV exposure and persistent infections.     Labs/EKG Reviewed:    EKG:   EKG is was ordered today.     TTE 03/2022 IMPRESSIONS   1. Left ventricular ejection fraction by 3D volume is 41 %. The left  ventricle has mild to moderately decreased function. The left ventricle  demonstrates global hypokinesis. The left ventricular internal cavity size  was moderately dilated. Left  ventricular diastolic parameters were normal. The average left ventricular  global longitudinal strain is -14.2 %. The global longitudinal strain is  abnormal.   2. Right ventricular systolic function is normal. The right ventricular  size is normal. There is normal pulmonary artery systolic pressure.   3. The mitral valve is normal in structure. Moderate mitral valve  regurgitation. No evidence of mitral stenosis.   4. The aortic valve is normal in structure. Aortic valve regurgitation is  not visualized. No aortic stenosis is present.   5. The inferior vena cava is normal in size with greater than 50%  respiratory variability, suggesting right atrial pressure of 3 mmHg.   Comparison(s): No significant change from prior study. Prior images  reviewed side by side.   Recent Labs: 01/05/2022: ALT 8; BNP 58.1; TSH 0.782 05/28/2022: BUN 7; Creatinine, Ser  0.77; Hemoglobin 10.1; Platelets 218; Potassium 4.1; Sodium 133   Recent Lipid Panel No results found for: "CHOL", "TRIG", "HDL", "CHOLHDL", "LDLCALC", "LDLDIRECT"  Physical Exam:    VS:  BP 110/70   Pulse 68   Ht 5\' 11"  (1.803 m)   Wt (!) 308 lb (139.7 kg)   LMP 09/04/2021 (Exact Date)  SpO2 99%   BMI 42.96 kg/m     Wt Readings from Last 3 Encounters:  07/31/22 (!) 308 lb (139.7 kg)  07/01/22 (!) 308 lb 14.4 oz (140.1 kg)  06/25/22 (!) 309 lb 12.8 oz (140.5 kg)     GEN:  Well nourished, well developed in no acute distress HEENT: Normal NECK: No JVD; No carotid bruits LYMPHATICS: No lymphadenopathy CARDIAC: RRR, no murmurs, rubs, gallops RESPIRATORY:  Clear to auscultation without rales, wheezing or rhonchi  ABDOMEN: Soft, non-tender, non-distended MUSCULOSKELETAL:  No edema; No deformity  SKIN: Warm and dry NEUROLOGIC:  Alert and oriented x 3 PSYCHIATRIC:  Normal affect    Risk Assessment/Risk Calculators:                  ASSESSMENT & PLAN:    Depressed ejection fraction Heart failure with reduced ejection fraction Obesity affecting pregnancy Moderate Mitral Regurgitation   Her recent echo 05/05/2022 EF still moderately depressed - 40-45%, mitral regurgitation. At this time we will get a cardiac MR.   Continue her toprol xl.   Given his young patient suspect that this etiology may be nonischemic but will limited to any evaluation to understand her cardiomyopathy.  Treatment is paramount here for this patient.     I have educated patient about warning signs understand if she gets worsening shortness of breath to notify my office.   Follow-up in 6 months or sooner if needed.  Patient Instructions  Medication Instructions:  Your physician recommends that you continue on your current medications as directed. Please refer to the Current Medication list given to you today.  *If you need a refill on your cardiac medications before your next appointment,  please call your pharmacy*   Lab Work: Your physician recommends that you have labs drawn today: CMET. CBC If you have labs (blood work) drawn today and your tests are completely normal, you will receive your results only by: MyChart Message (if you have MyChart) OR A paper copy in the mail If you have any lab test that is abnormal or we need to change your treatment, we will call you to review the results.   Testing/Procedures:   You will be scheduled for a Cardiac MRI. Please arrive for your appointment at arrive 30-45 minutes prior to test start time.  [facility] [address] [place], [place] [address] [phone] Please take advantage of the free valet parking available at the MAIN entrance (A entrance).  Proceed to the [facility] Radiology Department (First Floor) for check-in.   Magnetic resonance imaging (MRI) is a painless test that produces images of the inside of the body without using Xrays.  During an MRI, strong magnets and radio waves work together in a Data processing manager to form detailed images.   MRI images may provide more details about a medical condition than X-rays, CT scans, and ultrasounds can provide.  You may be given earphones to listen for instructions.  You may eat a light breakfast and take medications as ordered with the exception of furosemide, hydrochlorothiazide, or spironolactone(fluid pill, other). Please avoid stimulants for 12 hr prior to test. (Ie. Caffeine, nicotine, chocolate, or antihistamine medications)  If a contrast material will be used, an IV will be inserted into one of your veins. Contrast material will be injected into your IV. It will leave your body through your urine within a day. You may be told to drink plenty of fluids to help flush the contrast material out of your  system.  You will be asked to remove all metal, including: Watch, jewelry, and other metal objects including hearing aids, hair pieces and dentures.  Also wearable glucose monitoring systems (ie. Freestyle Libre and Omnipods) (Braces and fillings normally are not a problem.)   TEST WILL TAKE APPROXIMATELY 1 HOUR  PLEASE NOTIFY SCHEDULING AT LEAST 24 HOURS IN ADVANCE IF YOU ARE UNABLE TO KEEP YOUR APPOINTMENT. [phone]  Please call Rockwell Alexandria, cardiac imaging nurse navigator with any questions/concerns. Rockwell Alexandria RN Navigator Cardiac Imaging Larey Brick RN Navigator Cardiac Imaging [facility] [phone] Office    Follow-Up: At [facility], you and your health needs are our priority.  As part of our continuing mission to provide you with exceptional heart care, we have created designated Provider Care Teams.  These Care Teams include your primary Cardiologist (physician) and Advanced Practice Providers (APPs -  Physician Assistants and Nurse Practitioners) who all work together to provide you with the care you need, when you need it.   Your next appointment:   6 month(s)  Provider:   Thomasene Ripple, DO      Dispo:  No follow-ups on file.   Medication Adjustments/Labs and Tests Ordered: Current medicines are reviewed at length with the patient today.  Concerns regarding medicines are outlined above.  Tests Ordered: Orders Placed This Encounter  Procedures   MR CARDIAC MORPHOLOGY W WO CONTRAST   CBC with Differential/Platelet   Comp Met (CMET)   Medication Changes: No orders of the defined types were placed in this encounter.

## 2022-07-31 NOTE — Patient Instructions (Signed)
Medication Instructions:  Your physician recommends that you continue on your current medications as directed. Please refer to the Current Medication list given to you today.  *If you need a refill on your cardiac medications before your next appointment, please call your pharmacy*   Lab Work: Your physician recommends that you have labs drawn today: CMET. CBC If you have labs (blood work) drawn today and your tests are completely normal, you will receive your results only by: MyChart Message (if you have MyChart) OR A paper copy in the mail If you have any lab test that is abnormal or we need to change your treatment, we will call you to review the results.   Testing/Procedures:   You will be scheduled for a Cardiac MRI. Please arrive for your appointment at arrive 30-45 minutes prior to test start time.  [facility] [address] [place], [place] [address] [phone] Please take advantage of the free valet parking available at the MAIN entrance (A entrance).  Proceed to the [facility] Radiology Department (First Floor) for check-in.   Magnetic resonance imaging (MRI) is a painless test that produces images of the inside of the body without using Xrays.  During an MRI, strong magnets and radio waves work together in a Data processing manager to form detailed images.   MRI images may provide more details about a medical condition than X-rays, CT scans, and ultrasounds can provide.  You may be given earphones to listen for instructions.  You may eat a light breakfast and take medications as ordered with the exception of furosemide, hydrochlorothiazide, or spironolactone(fluid pill, other). Please avoid stimulants for 12 hr prior to test. (Ie. Caffeine, nicotine, chocolate, or antihistamine medications)  If a contrast material will be used, an IV will be inserted into one of your veins. Contrast material will be injected into your IV. It will leave your body through your  urine within a day. You may be told to drink plenty of fluids to help flush the contrast material out of your system.  You will be asked to remove all metal, including: Watch, jewelry, and other metal objects including hearing aids, hair pieces and dentures. Also wearable glucose monitoring systems (ie. Freestyle Libre and Omnipods) (Braces and fillings normally are not a problem.)   TEST WILL TAKE APPROXIMATELY 1 HOUR  PLEASE NOTIFY SCHEDULING AT LEAST 24 HOURS IN ADVANCE IF YOU ARE UNABLE TO KEEP YOUR APPOINTMENT. [phone]  Please call Rockwell Alexandria, cardiac imaging nurse navigator with any questions/concerns. Rockwell Alexandria RN Navigator Cardiac Imaging Larey Brick RN Navigator Cardiac Imaging [facility] [phone] Office    Follow-Up: At [facility], you and your health needs are our priority.  As part of our continuing mission to provide you with exceptional heart care, we have created designated Provider Care Teams.  These Care Teams include your primary Cardiologist (physician) and Advanced Practice Providers (APPs -  Physician Assistants and Nurse Practitioners) who all work together to provide you with the care you need, when you need it.   Your next appointment:   6 month(s)  Provider:   Thomasene Ripple, DO

## 2022-08-14 ENCOUNTER — Telehealth: Payer: Medicaid Other | Admitting: Family Medicine

## 2022-08-14 DIAGNOSIS — K047 Periapical abscess without sinus: Secondary | ICD-10-CM

## 2022-08-14 MED ORDER — PENICILLIN V POTASSIUM 500 MG PO TABS: 500.0000 mg | ORAL_TABLET | Freq: Three times a day (TID) | ORAL | 0 refills | Status: AC

## 2022-08-14 NOTE — Patient Instructions (Signed)
Dental Pain Dental pain is often a sign that something is wrong with your teeth or gums. You can also have pain after a dental treatment. If you have dental pain, it is important to contact your dentist, especially if the cause of the pain is not known. Dental pain may hurt a lot or a little and can be caused by many things, including: Tooth decay (cavities or caries). Infection. The inner part of the tooth being filled with pus (an abscess). Injury. A crack in the tooth. Gums that move back and expose the root of a tooth. Gum disease. Abnormal grinding or clenching of teeth. Not taking good care of your teeth. Sometimes the cause of pain is not known. You may have pain all the time, or it may happen only when you are: Chewing. Exposed to hot or cold temperatures. Eating or drinking foods or drinks that have a lot of sugar in them, such as soda or candy. Follow these instructions at home: Medicines Take over-the-counter and prescription medicines only as told by your dentist. If you were prescribed an antibiotic medicine, take it as told by your dentist. Do not stop taking it even if you start to feel better. Eating and drinking Do not eat foods or drinks that cause you pain. These include: Very hot or very cold foods or drinks. Sweet or sugary foods or drinks. Managing pain and swelling  If told, put ice on the painful area of your face. To do this: Put ice in a plastic bag. Place a towel between your skin and the bag. Leave the ice on for 20 minutes, 2-3 times a day. Take off the ice if your skin turns bright red. This is very important. If you cannot feel pain, heat, or cold, you have a greater risk of damage to the area. Brushing your teeth Brush your teeth twice a day using a fluoride toothpaste. Use a toothpaste made for sensitive teeth as told by your dentist. Use a soft toothbrush. General instructions Floss your teeth at least once a day. Do not put heat on the outside  of your face. Rinse your mouth often with salt water. To make salt water, dissolve -1 tsp (3-6 g) of salt in 1 cup (237 mL) of warm water. Watch your dental pain. Let your dentist know if there are any changes. Keep all follow-up visits. Contact a dentist if: You have dental pain and you do not know why. Medicine does not help your pain. Your symptoms get worse. You have new symptoms. Get help right away if: You cannot open your mouth. You are having trouble breathing or swallowing. You have a fever. Your face, neck, or jaw is swollen. These symptoms may be an emergency. Get help right away. Call your local emergency services (911 in the [place] Do not wait to see if the symptoms will go away. Do not drive yourself to the hospital. Summary Dental pain may be caused by many things, including tooth decay, injury, or infection. In some cases, the cause is not known. Dental pain may hurt a lot or very little. You may have pain all the time, or you may have it only when you eat or drink. Take over-the-counter and prescription medicines only as told by your dentist. Watch your dental pain for any changes. Let your dentist know if symptoms get worse. This information is not intended to replace advice given to you by your health care provider. Make sure you discuss any questions you have with   your health care provider. Document Revised: 11/22/2019 Document Reviewed: 11/22/2019 Elsevier Patient Education  2024 [employer].

## 2022-08-14 NOTE — Progress Notes (Signed)
Virtual Visit Consent   Jamie Gates, you are scheduled for a virtual visit with a [facility] provider today. Just as with appointments in the office, your consent must be obtained to participate. Your consent will be active for this visit and any virtual visit you may have with one of our providers in the next 365 days. If you have a MyChart account, a copy of this consent can be sent to you electronically.  As this is a virtual visit, video technology does not allow for your provider to perform a traditional examination. This may limit your provider's ability to fully assess your condition. If your provider identifies any concerns that need to be evaluated in person or the need to arrange testing (such as labs, EKG, etc.), we will make arrangements to do so. Although advances in technology are sophisticated, we cannot ensure that it will always work on either your end or our end. If the connection with a video visit is poor, the visit may have to be switched to a telephone visit. With either a video or telephone visit, we are not always able to ensure that we have a secure connection.  By engaging in this virtual visit, you consent to the provision of healthcare and authorize for your insurance to be billed (if applicable) for the services provided during this visit. Depending on your insurance coverage, you may receive a charge related to this service.  I need to obtain your verbal consent now. Are you willing to proceed with your visit today? Ritaann Leppo has provided verbal consent on 08/14/2022 for a virtual visit (video or telephone). Georgana Curio, FNP  Date: 08/14/2022 3:34 PM  Virtual Visit via Video Note   I, Georgana Curio, connected with  Jamie Gates  ([id number], 2000/04/16) on 08/14/22 at  3:30 PM EDT by a video-enabled telemedicine application and verified that I am speaking with the correct person using two identifiers.  Location: Patient: Virtual Visit Location Patient:  Home Provider: Virtual Visit Location Provider: Home Office   I discussed the limitations of evaluation and management by telemedicine and the availability of in person appointments. The patient expressed understanding and agreed to proceed.    History of Present Illness: Jamie Gates is a 22 y.o. who identifies as a female who was assigned female at birth, and is being seen today for left upper and lower dental pain radiating into left ear. She says this has been ongoing for several days worsening. No fever. No history. Gums may be swollen not draining. Marland Kitchen  HPI: HPI  Problems:  Patient Active Problem List   Diagnosis Date Noted   Encounter for initial prescription of implantable subdermal contraceptive 05/29/2022   Nexplanon insertion 05/29/2022   GBS (group B Streptococcus carrier), +RV culture, currently pregnant 05/14/2022   Red Chart Patient 04/21/2022   BMI 45.0-49.9, adult (HCC) 03/23/2022   Anemia of pregnancy 03/10/2022   Atypical squamous cell changes of undetermined significance (ASCUS) with negative high risk human papilloma virus (HPV) pap 01/05/2022 01/08/2022   Heart failure with reduced ejection fraction (HCC) 01/05/2022   Supervision of high risk pregnancy, antepartum 12/10/2021   History of maternal cardiomyopathy, currently pregnant 11/06/2021   Vaginal delivery 03/14/2021   History of IUFD at 32 weeks 03/11/2021   Mitral regurgitation of mother during pregnancy 03/11/2021   Cardiomyopathy, nonischemic (HCC) 02/05/2021   Rh negative state in antepartum period 01/07/2021   Adjustment disorder with depressed mood 09/30/2020   Abnormal antibody titer (anti-Lewis antibodies) 09/30/2020  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   Right ovarian cyst 04/13/2018   Cardiomegaly 12/18/2012   Maternal morbid obesity, antepartum (HCC) 12/04/2010    Allergies: No Known Allergies Medications:  Current Outpatient Medications:    ferrous sulfate (FERROUSUL) 325 (65 FE) MG tablet, Take 1  tablet (325 mg total) by mouth every other day., Disp: 30 tablet, Rfl: 3   metoprolol succinate (TOPROL XL) 25 MG 24 hr tablet, Take 1 tablet (25 mg total) by mouth daily., Disp: 60 tablet, Rfl: 0   potassium chloride (KLOR-CON) 10 MEQ tablet, TAKE 1 TABLET BY MOUTH ONCE A WEEK, Disp: 30 tablet, Rfl: 1   Prenatal 27-1 MG TABS, Take 1 tablet by mouth daily., Disp: 30 tablet, Rfl: 10   senna-docusate (SENOKOT-S) 8.6-50 MG tablet, Take 2 tablets by mouth daily., Disp: 30 tablet, Rfl: 0  Observations/Objective: Patient is well-developed, well-nourished in no acute distress.  Resting comfortably  at home.  Head is normocephalic, atraumatic.  No labored breathing.  Speech is clear and coherent with logical content.  Patient is alert and oriented at baseline.    Assessment and Plan: 1. Dental infection  Warm salt water rinses, make apptmt to follow up with dentist, UC if sx worsen.   Follow Up Instructions: I discussed the assessment and treatment plan with the patient. The patient was provided an opportunity to ask questions and all were answered. The patient agreed with the plan and demonstrated an understanding of the instructions.  A copy of instructions were sent to the patient via MyChart unless otherwise noted below.     The patient was advised to call back or seek an in-person evaluation if the symptoms worsen or if the condition fails to improve as anticipated.  Time:  I spent 10 minutes with the patient via telehealth technology discussing the above problems/concerns.    Georgana Curio, FNP

## 2022-10-19 ENCOUNTER — Ambulatory Visit: Payer: Medicaid Other

## 2022-10-19 ENCOUNTER — Encounter (HOSPITAL_COMMUNITY): Payer: Self-pay

## 2022-10-20 ENCOUNTER — Telehealth (HOSPITAL_COMMUNITY): Payer: Self-pay | Admitting: *Deleted

## 2022-10-20 NOTE — Telephone Encounter (Signed)
Attempted to call patient regarding upcoming cardiac MRI appointment. Left message on voicemail with name and callback number  Merle Prescott RN Navigator Cardiac Imaging [facility] [phone] Office [phone] Cell  

## 2022-10-21 ENCOUNTER — Ambulatory Visit (HOSPITAL_COMMUNITY): Admission: RE | Admit: 2022-10-21 | Payer: Self-pay | Source: Ambulatory Visit

## 2022-11-13 ENCOUNTER — Telehealth: Payer: Medicaid Other | Admitting: Nurse Practitioner

## 2022-11-13 DIAGNOSIS — J069 Acute upper respiratory infection, unspecified: Secondary | ICD-10-CM | POA: Diagnosis not present

## 2022-11-13 MED ORDER — IPRATROPIUM BROMIDE 0.03 % NA SOLN: 2.0000 | Freq: Two times a day (BID) | NASAL | 12 refills | Status: DC

## 2022-11-13 MED ORDER — BENZONATATE 100 MG PO CAPS: 100.0000 mg | ORAL_CAPSULE | Freq: Three times a day (TID) | ORAL | 0 refills | Status: DC | PRN

## 2022-11-13 NOTE — Progress Notes (Signed)
E-Visit for Upper Respiratory Infection   We are sorry you are not feeling well.  Here is how we plan to help!  Based on what you have shared with me, it looks like you may have a viral upper respiratory infection.  Upper respiratory infections are caused by a large number of viruses; however, rhinovirus is the most common cause.   Symptoms vary from person to person, with common symptoms including sore throat, cough, fatigue or lack of energy and feeling of general discomfort.  A low-grade fever of up to 100.4 may present, but is often uncommon.  Symptoms vary however, and are closely related to a person's age or underlying illnesses.  The most common symptoms associated with an upper respiratory infection are nasal discharge or congestion, cough, sneezing, headache and pressure in the ears and face.  These symptoms usually persist for about 3 to 10 days, but can last up to 2 weeks.  It is important to know that upper respiratory infections do not cause serious illness or complications in most cases.    Upper respiratory infections can be transmitted from person to person, with the most common method of transmission being a person's hands.  The virus is able to live on the skin and can infect other persons for up to 2 hours after direct contact.  Also, these can be transmitted when someone coughs or sneezes; thus, it is important to cover the mouth to reduce this risk.  To keep the spread of the illness at bay, good hand hygiene is very important.  This is an infection that is most likely caused by a virus. There are no specific treatments other than to help you with the symptoms until the infection runs its course.  We are sorry you are not feeling well.  Here is how we plan to help!   For nasal congestion, you may use an oral decongestants such as Mucinex D or if you have glaucoma or high blood pressure use plain Mucinex.  Saline nasal spray or nasal drops can help and can safely be used as often as  needed for congestion.  For your congestion, I have prescribed Ipratropium Bromide nasal spray 0.03% two sprays in each nostril 2-3 times a day  If you do not have a history of heart disease, hypertension, diabetes or thyroid disease, prostate/bladder issues or glaucoma, you may also use Sudafed to treat nasal congestion.  It is highly recommended that you consult with a pharmacist or your primary care physician to ensure this medication is safe for you to take.     If you have a cough, you may use cough suppressants such as Delsym and Robitussin.  If you have glaucoma or high blood pressure, you can also use Coricidin HBP.   For cough I have prescribed for you A prescription cough medication called Tessalon Perles 100 mg. You may take 1-2 capsules every 8 hours as needed for cough  If you have a sore or scratchy throat, use a saltwater gargle-  to  teaspoon of salt dissolved in a 4-ounce to 8-ounce glass of warm water.  Gargle the solution for approximately 15-30 seconds and then spit.  It is important not to swallow the solution.  You can also use throat lozenges/cough drops and Chloraseptic spray to help with throat pain or discomfort.  Warm or cold liquids can also be helpful in relieving throat pain.  For headache, pain or general discomfort, you can use Ibuprofen or Tylenol as directed.  Some authorities believe that zinc sprays or the use of Echinacea may shorten the course of your symptoms.   HOME CARE Only take medications as instructed by your medical team. Be sure to drink plenty of fluids. Water is fine as well as fruit juices, sodas and electrolyte beverages. You may want to stay away from caffeine or alcohol. If you are nauseated, try taking small sips of liquids. How do you know if you are getting enough fluid? Your urine should be a pale yellow or almost colorless. Get rest. Taking a steamy shower or using a humidifier may help nasal congestion and ease sore throat pain. You can  place a towel over your head and breathe in the steam from hot water coming from a faucet. Using a saline nasal spray works much the same way. Cough drops, hard candies and sore throat lozenges may ease your cough. Avoid close contacts especially the very young and the elderly Cover your mouth if you cough or sneeze Always remember to wash your hands.   GET HELP RIGHT AWAY IF: You develop worsening fever. If your symptoms do not improve within 10 days You develop yellow or green discharge from your nose over 3 days. You have coughing fits You develop a severe head ache or visual changes. You develop shortness of breath, difficulty breathing or start having chest pain Your symptoms persist after you have completed your treatment plan  MAKE SURE YOU  Understand these instructions. Will watch your condition. Will get help right away if you are not doing well or get worse.  Thank you for choosing an e-visit.  Your e-visit answers were reviewed by a board certified advanced clinical practitioner to complete your personal care plan. Depending upon the condition, your plan could have included both over the counter or prescription medications.  Please review your pharmacy choice. Make sure the pharmacy is open so you can pick up prescription now. If there is a problem, you may contact your provider through [employer] and have the prescription routed to another pharmacy.  Your safety is important to [place]. If you have drug allergies check your prescription carefully.   For the next 24 hours you can use MyChart to ask questions about today's visit, request a non-urgent call back, or ask for a work or school excuse. You will get an email in the next two days asking about your experience. I hope that your e-visit has been valuable and will speed your recovery.  Meds ordered this encounter  Medications   ipratropium (ATROVENT) 0.03 % nasal spray    Sig: Place 2 sprays into both nostrils every  12 (twelve) hours.    Dispense:  30 mL    Refill:  12   benzonatate (TESSALON) 100 MG capsule    Sig: Take 1 capsule (100 mg total) by mouth 3 (three) times daily as needed.    Dispense:  30 capsule    Refill:  0    I spent approximately 5 minutes reviewing the patient's history, current symptoms and coordinating their care today.

## 2022-12-21 ENCOUNTER — Ambulatory Visit: Payer: Self-pay

## 2023-01-03 ENCOUNTER — Other Ambulatory Visit (HOSPITAL_COMMUNITY): Payer: Self-pay

## 2023-02-12 ENCOUNTER — Ambulatory Visit: Payer: Medicaid Other | Admitting: Cardiology

## 2023-02-26 ENCOUNTER — Ambulatory Visit: Payer: Self-pay

## 2023-03-05 ENCOUNTER — Ambulatory Visit (INDEPENDENT_AMBULATORY_CARE_PROVIDER_SITE_OTHER): Payer: Medicaid Other

## 2023-03-05 DIAGNOSIS — Z111 Encounter for screening for respiratory tuberculosis: Secondary | ICD-10-CM

## 2023-03-05 NOTE — Progress Notes (Signed)
 Patient is here for a PPD placement.  PPD placed in left forearm @ 1015 am.  Patient will return 03/08/2023 to have PPD read. Veronda Prude, RN

## 2023-03-08 ENCOUNTER — Ambulatory Visit (INDEPENDENT_AMBULATORY_CARE_PROVIDER_SITE_OTHER): Payer: Self-pay

## 2023-03-08 ENCOUNTER — Ambulatory Visit (HOSPITAL_COMMUNITY)
Admission: EM | Admit: 2023-03-08 | Discharge: 2023-03-08 | Disposition: A | Discharge status: Home / Self Care | Payer: Medicaid Other

## 2023-03-08 ENCOUNTER — Encounter (HOSPITAL_COMMUNITY): Payer: Self-pay | Admitting: *Deleted

## 2023-03-08 DIAGNOSIS — T148XXA Other injury of unspecified body region, initial encounter: Secondary | ICD-10-CM

## 2023-03-08 DIAGNOSIS — M549 Dorsalgia, unspecified: Secondary | ICD-10-CM | POA: Diagnosis not present

## 2023-03-08 DIAGNOSIS — M62830 Muscle spasm of back: Secondary | ICD-10-CM | POA: Diagnosis not present

## 2023-03-08 DIAGNOSIS — Z111 Encounter for screening for respiratory tuberculosis: Secondary | ICD-10-CM

## 2023-03-08 LAB — TB SKIN TEST
Induration: 0 mm
TB Skin Test: NEGATIVE

## 2023-03-08 MED ORDER — KETOROLAC TROMETHAMINE 30 MG/ML IJ SOLN
INTRAMUSCULAR | Status: AC
  Filled 2023-03-08: qty 1

## 2023-03-08 MED ORDER — IBUPROFEN 600 MG PO TABS: 600.0000 mg | ORAL_TABLET | Freq: Three times a day (TID) | ORAL | 0 refills | Status: DC | PRN

## 2023-03-08 MED ORDER — ACETAMINOPHEN 325 MG PO TABS
975.0000 mg | ORAL_TABLET | Freq: Once | ORAL | Status: AC
  Administered 2023-03-08: 975 mg via ORAL

## 2023-03-08 MED ORDER — ACETAMINOPHEN 325 MG PO TABS
ORAL_TABLET | ORAL | Status: AC
  Filled 2023-03-08: qty 3

## 2023-03-08 MED ORDER — ACETAMINOPHEN 500 MG PO TABS: 1000.0000 mg | ORAL_TABLET | Freq: Three times a day (TID) | ORAL | 0 refills | Status: AC

## 2023-03-08 MED ORDER — KETOROLAC TROMETHAMINE 30 MG/ML IJ SOLN
30.0000 mg | Freq: Once | INTRAMUSCULAR | Status: AC
  Administered 2023-03-08: 30 mg via INTRAMUSCULAR

## 2023-03-08 MED ORDER — BACLOFEN 10 MG PO TABS: 10.0000 mg | ORAL_TABLET | Freq: Every day | ORAL | 0 refills | Status: AC

## 2023-03-08 NOTE — ED Notes (Signed)
 Reviewed work notes

## 2023-03-08 NOTE — Discharge Instructions (Addendum)
 The mainstay of therapy for musculoskeletal pain is reduction of inflammation and relaxation of tension which is causing inflammation.  Keep in mind, pain always begets more pain.  To help you stay ahead of your pain and inflammation, I have provided the following regimen for you:   During your visit today, you received an injection of ketorolac , high-dose nonsteroidal anti-inflammatory pain medication that should significantly reduce your pain for the next 6 to 8 hours.   You were also provided with Tylenol  (acetaminophen ) 975 mg.  Please begin taking Tylenol  975 mg every 8 hours to keep your pain well controlled. Please know that It is safe to take a maximum 3000 mg of Tylenol  in a 24-hour period.  Please do not exceed this amount.  Tylenol  works best when taken on a scheduled basis.   This evening, please begin taking baclofen  10 mg.  This is a highly effective muscle relaxer and antispasmodic which should continue to provide you with relaxation of your tense muscles, allow you to sleep well and to keep your pain under control.   This evening, please begin taking ibuprofen  600 mg 3 times daily.  Please keep in mind that it is always easier to treat a little bit of pain that is to treat a lot of pain.  I recommend that for the next several days, you take this medication on a scheduled basis.  After that, take it when you begin to feel the pain returning, do not wait until you are in a lot of pain.   During the day, please set aside time to apply ice to the affected area 4 times daily for 20 minutes each application.  This can be achieved by using a bag of frozen peas or corn, a Ziploc bag filled with ice and water, or Ziploc bag filled with half rubbing alcohol and half Dawn dish detergent, frozen into a slush.  Please be careful not to apply ice directly to your skin, always place a soft cloth between you and the ice pack.  Over-the-counter products such as IcyHot and Biofreeze do not work nearly as  well.   I have enclosed a picture of myofascial release balls that can be purchased on [email or web address].     Please visit this website for tips and tricks on how to use this myofacial release technique to relieve muscle spasm and back pain: https://www.[employer].com/blog/self-myofascial-release-techniques-using-massage-balls   Please avoid attempts to stretch or strengthen the affected area until you are feeling completely pain-free.  Attempts to do so will only prolong the healing process.   I also recommend that you remain out of work for the next several days, I provided you with a note to return to work in 3 days.  If you feel that you need this time extended, please follow-up with your primary care provider or return to urgent care for reevaluation so that we can provide you with a note for another 3 days.   Thank you for visiting [facility] today.  We appreciate the opportunity to participate in your care.

## 2023-03-08 NOTE — ED Triage Notes (Signed)
 Pt states she has back pain since she had a epidural during child birth 9 months ago. She states that there is only pain when she is sleeping and first wakes up. She has been taking tylenol as needed without relief.

## 2023-03-08 NOTE — Progress Notes (Signed)
 Patient is here for a PPD read.  It was placed on 03/05/2023 in the left forearm @ 1015 am.    PPD RESULTS:  Result: negative Induration: 0 mm  Letter created and given to patient for documentation purposes. Veronda Prude, RN

## 2023-03-08 NOTE — ED Provider Notes (Signed)
 [facility]    CSN: [id number] Arrival date & time: 03/08/23  1028    HISTORY   Chief Complaint  Patient presents with   Back Pain   HPI Jamie Gates is a pleasant, 23 y.o. female who presents to urgent care today. Patient complains of upper back pain for the past 9 months.  Patient states she had a baby 9 months ago, had a vaginal delivery and epidural was placed for pain relief.  Patient states she only experiences pain when she lies down at night and sometimes the pain wakes her from sleeping.  Patient states she has been taking Tylenol  as needed without relief.  States the pain has been interfering with her ability to carry her baby.  Patient states she is never had a similar back pain in the past.  The history is provided by the patient.   Past Medical History:  Diagnosis Date   Abnormal antibody titer (anti-Lewis antibodies) 09/30/2020   Adjustment disorder with depressed mood 09/30/2020   Cardiomegaly 12/18/2012   still working with cardiology-esp now with pregnancy   Cardiomyopathy, nonischemic (HCC) 02/05/2021   Chlamydia 09/23/2018   Treated 09/23/2018   Gonorrhea 09/23/2018   Treated 09/23/2018   Heart failure with reduced ejection fraction (HCC) 01/05/2022   Her echo in 03/2021 showed low normal EF.  Recent echo back down mildy depressed 40-45% 11/25/2021.    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   Migraines    Mitral regurgitation    Morbid obesity (HCC)    Ovarian cyst    Pregnancy induced hypertension    Patient Active Problem List   Diagnosis Date Noted   Encounter for initial prescription of implantable subdermal contraceptive 05/29/2022   Nexplanon  insertion 05/29/2022   GBS (group B Streptococcus carrier), +RV culture, currently pregnant 05/14/2022   Red Chart Patient 04/21/2022   BMI 45.0-49.9, adult (HCC) 03/23/2022   Anemia of pregnancy 03/10/2022   Atypical squamous cell changes of undetermined significance (ASCUS) with negative high risk human  papilloma virus (HPV) pap 01/05/2022 01/08/2022   Heart failure with reduced ejection fraction (HCC) 01/05/2022   Supervision of high risk pregnancy, antepartum 12/10/2021   History of maternal cardiomyopathy, currently pregnant 11/06/2021   Vaginal delivery 03/14/2021   History of IUFD at 32 weeks 03/11/2021   Mitral regurgitation of mother during pregnancy 03/11/2021   Cardiomyopathy, nonischemic (HCC) 02/05/2021   Rh negative state in antepartum period 01/07/2021   Adjustment disorder with depressed mood 09/30/2020   Abnormal antibody titer (anti-Lewis antibodies) 09/30/2020   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   Right ovarian cyst 04/13/2018   Cardiomegaly 12/18/2012   Maternal morbid obesity, antepartum (HCC) 12/04/2010   Past Surgical History:  Procedure Laterality Date   NEXPLANON  TRAY  05/29/2022   TYMPANOSTOMY TUBE PLACEMENT     OB History     Gravida  2   Para  2   Term  1   Preterm  1   AB  0   Living  1      SAB  0   IAB  0   Ectopic  0   Multiple  0   Live Births  1          Home Medications    Prior to Admission medications   Medication Sig Start Date End Date Taking? Authorizing Provider  benzonatate  (TESSALON ) 100 MG capsule Take 1 capsule (100 mg total) by mouth 3 (three) times daily as needed. 11/13/22   Kennyth Domino, FNP  ferrous sulfate  SERAPIO)  325 (65 FE) MG tablet Take 1 tablet (325 mg total) by mouth every other day. 03/10/22   Cleatus Moccasin, MD  ipratropium (ATROVENT ) 0.03 % nasal spray Place 2 sprays into both nostrils every 12 (twelve) hours. 11/13/22   Kennyth Domino, FNP  metoprolol  succinate (TOPROL  XL) 25 MG 24 hr tablet Take 1 tablet (25 mg total) by mouth daily. 05/30/22   Ndulue, Chiagoziem J, MD  potassium chloride  (KLOR-CON ) 10 MEQ tablet TAKE 1 TABLET BY MOUTH ONCE A WEEK 04/20/22   Tobb, Kardie, DO  Prenatal 27-1 MG TABS Take 1 tablet by mouth daily. 11/05/21   Joshua Domino, DO  senna-docusate (SENOKOT-S) 8.6-50 MG tablet Take 2  tablets by mouth daily. 05/29/22   Ndulue, Chiagoziem J, MD    Family History Family History  Problem Relation Age of Onset   Raynaud syndrome Mother    Diabetes Father    Hyperlipidemia Father    Hypertension Father    Asthma Neg Hx    Stroke Neg Hx    Social History Social History   Tobacco Use   Smoking status: Former    Current packs/day: 0.00    Types: Cigars, Cigarettes    Quit date: 09/03/2020    Years since quitting: 2.5   Smokeless tobacco: Never  Vaping Use   Vaping status: Never Used  Substance Use Topics   Alcohol use: No   Drug use: Not Currently    Types: Marijuana   Allergies   Patient has no known allergies.  Review of Systems Review of Systems Pertinent findings revealed after performing a 14 point review of systems has been noted in the history of present illness.  Physical Exam Vital Signs BP 135/76 (BP Location: Left Arm)   Pulse (!) 49   Temp 98.3 F (36.8 C) (Oral)   Resp 18   LMP  (LMP Unknown)   SpO2 98%   No data found.  Physical Exam Vitals and nursing note reviewed.  Constitutional:      General: She is not in acute distress.    Appearance: Normal appearance. She is well-developed and well-groomed. She is morbidly obese.  HENT:     Head: Normocephalic and atraumatic.  Eyes:     Pupils: Pupils are equal, round, and reactive to light.  Cardiovascular:     Rate and Rhythm: Normal rate and regular rhythm.  Pulmonary:     Effort: Pulmonary effort is normal.     Breath sounds: Normal breath sounds.  Musculoskeletal:        General: Normal range of motion.     Cervical back: Normal range of motion and neck supple.     Thoracic back: Spasms and tenderness present.       Back:  Skin:    General: Skin is warm and dry.  Neurological:     General: No focal deficit present.     Mental Status: She is alert and oriented to person, place, and time. Mental status is at baseline.  Psychiatric:        Mood and Affect: Mood normal.         Behavior: Behavior normal.        Thought Content: Thought content normal.        Judgment: Judgment normal.     Visual Acuity Right Eye Distance:   Left Eye Distance:   Bilateral Distance:    Right Eye Near:   Left Eye Near:    Bilateral Near:     UC Couse /  Diagnostics / Procedures:     Radiology No results found.  Procedures Procedures (including critical care time) EKG  Pending results:  Labs Reviewed - No data to display  Medications Ordered in UC: Medications  ketorolac  (TORADOL ) 30 MG/ML injection 30 mg (has no administration in time range)  acetaminophen  (TYLENOL ) tablet 975 mg (975 mg Oral Given 03/08/23 1249)    UC Diagnoses / Final Clinical Impressions(s)   I have reviewed the triage vital signs and the nursing notes.  Pertinent labs & imaging results that were available during my care of the patient were reviewed by me and considered in my medical decision making (see chart for details).    Final diagnoses:  Muscle strain  Muscle spasm of back  Mid back pain   Patient advised back pain is related to carrying her newborn baby.  Patient states she is not breast-feeding.  Recommend performing as many baby related activities on ground-level as possible and avoid carrying baby car seat and equipment as much as possible.  Patient provided with ketorolac  injection for immediate pain relief and advised to begin Tylenol  1000 mg 3 times daily, ibuprofen  600 mg 3 times daily and baclofen  nightly until feeling better.  Recommend ice pack to back for 20 minutes 4 times daily.  Return precautions advised.  Please see discharge instructions below for details of plan of care as provided to patient. ED Prescriptions     Medication Sig Dispense Auth. Provider   baclofen  (LIORESAL ) 10 MG tablet Take 1 tablet (10 mg total) by mouth at bedtime for 14 days. 14 tablet Joesph Shaver Scales, PA-C   ibuprofen  (ADVIL ) 600 MG tablet Take 1 tablet (600 mg total) by mouth every 8  (eight) hours as needed for up to 30 doses for fever, headache, mild pain (pain score 1-3) or moderate pain (pain score 4-6) (Inflammation). Take 1 tablet 3 times daily as needed for inflammation of upper airways and/or pain. 30 tablet Joesph Shaver Scales, PA-C   acetaminophen  (TYLENOL ) 500 MG tablet Take 2 tablets (1,000 mg total) by mouth every 8 (eight) hours. 180 tablet Joesph Shaver Scales, PA-C      PDMP not reviewed this encounter.  Pending results:  Labs Reviewed - No data to display    Discharge Instructions      The mainstay of therapy for musculoskeletal pain is reduction of inflammation and relaxation of tension which is causing inflammation.  Keep in mind, pain always begets more pain.  To help you stay ahead of your pain and inflammation, I have provided the following regimen for you:   During your visit today, you received an injection of ketorolac , high-dose nonsteroidal anti-inflammatory pain medication that should significantly reduce your pain for the next 6 to 8 hours.   You were also provided with Tylenol  (acetaminophen ) 975 mg.  Please begin taking Tylenol  975 mg every 8 hours to keep your pain well controlled. Please know that It is safe to take a maximum 3000 mg of Tylenol  in a 24-hour period.  Please do not exceed this amount.  Tylenol  works best when taken on a scheduled basis.   This evening, please begin taking baclofen  10 mg.  This is a highly effective muscle relaxer and antispasmodic which should continue to provide you with relaxation of your tense muscles, allow you to sleep well and to keep your pain under control.   This evening, please begin taking ibuprofen  600 mg 3 times daily.  Please keep in mind that it is always  easier to treat a little bit of pain that is to treat a lot of pain.  I recommend that for the next several days, you take this medication on a scheduled basis.  After that, take it when you begin to feel the pain returning, do not wait  until you are in a lot of pain.   During the day, please set aside time to apply ice to the affected area 4 times daily for 20 minutes each application.  This can be achieved by using a bag of frozen peas or corn, a Ziploc bag filled with ice and water, or Ziploc bag filled with half rubbing alcohol and half Dawn dish detergent, frozen into a slush.  Please be careful not to apply ice directly to your skin, always place a soft cloth between you and the ice pack.  Over-the-counter products such as IcyHot and Biofreeze do not work nearly as well.   I have enclosed a picture of myofascial release balls that can be purchased on [email or web address].     Please visit this website for tips and tricks on how to use this myofacial release technique to relieve muscle spasm and back pain: https://www.[employer]uneupfitness.com/blog/self-myofascial-release-techniques-using-massage-balls   Please avoid attempts to stretch or strengthen the affected area until you are feeling completely pain-free.  Attempts to do so will only prolong the healing process.   I also recommend that you remain out of work for the next several days, I provided you with a note to return to work in 3 days.  If you feel that you need this time extended, please follow-up with your primary care provider or return to urgent care for reevaluation so that we can provide you with a note for another 3 days.   Thank you for visiting [facility] today.  We appreciate the opportunity to participate in your care.       Disposition Upon Discharge:  Condition: stable for discharge home  Patient presented with an acute illness with associated systemic symptoms and significant discomfort requiring urgent management. In my opinion, this is a condition that a prudent lay person (someone who possesses an average knowledge of health and medicine) may potentially expect to result in complications if not addressed urgently such as respiratory distress,  impairment of bodily function or dysfunction of bodily organs.   Routine symptom specific, illness specific and/or disease specific instructions were discussed with the patient and/or caregiver at length.   As such, the patient has been evaluated and assessed, work-up was performed and treatment was provided in alignment with urgent care protocols and evidence based medicine.  Patient/parent/caregiver has been advised that the patient may require follow up for further testing and treatment if the symptoms continue in spite of treatment, as clinically indicated and appropriate.  Patient/parent/caregiver has been advised to return to the [facility] or PCP if no better; to PCP or the Emergency Department if new signs and symptoms develop, or if the current signs or symptoms continue to change or worsen for further workup, evaluation and treatment as clinically indicated and appropriate  The patient will follow up with their current PCP if and as advised. If the patient does not currently have a PCP we will assist them in obtaining one.   The patient may need specialty follow up if the symptoms continue, in spite of conservative treatment and management, for further workup, evaluation, consultation and treatment as clinically indicated and appropriate.  Patient/parent/caregiver verbalized understanding and agreement of plan as discussed.  All  questions were addressed during visit.  Please see discharge instructions below for further details of plan.  This office note has been dictated using Teaching laboratory technician.  Unfortunately, this method of dictation can sometimes lead to typographical or grammatical errors.  I apologize for your inconvenience in advance if this occurs.  Please do not hesitate to reach out to me if clarification is needed.      Joesph Shaver Scales, PA-C 03/08/23 1252

## 2023-03-26 ENCOUNTER — Ambulatory Visit (INDEPENDENT_AMBULATORY_CARE_PROVIDER_SITE_OTHER): Payer: Self-pay | Admitting: Cardiology

## 2023-03-26 ENCOUNTER — Other Ambulatory Visit (HOSPITAL_COMMUNITY): Payer: Self-pay

## 2023-03-26 ENCOUNTER — Encounter: Payer: Self-pay | Admitting: Cardiology

## 2023-03-26 VITALS — BP 138/78 | HR 60 | Ht 71.0 in | Wt 314.9 lb

## 2023-03-26 DIAGNOSIS — R0989 Other specified symptoms and signs involving the circulatory and respiratory systems: Secondary | ICD-10-CM

## 2023-03-26 DIAGNOSIS — O09899 Supervision of other high risk pregnancies, unspecified trimester: Secondary | ICD-10-CM | POA: Diagnosis not present

## 2023-03-26 DIAGNOSIS — Z139 Encounter for screening, unspecified: Secondary | ICD-10-CM

## 2023-03-26 MED ORDER — AMLODIPINE BESYLATE 2.5 MG PO TABS
2.5000 mg | ORAL_TABLET | Freq: Every day | ORAL | 3 refills | Status: AC
  Filled 2023-03-26: qty 90, 90d supply, fill #0

## 2023-03-26 MED ORDER — BLOOD PRESSURE MONITOR AUTOMAT DEVI: 1.0000 [IU] | Freq: Once | 0 refills | Status: DC

## 2023-03-26 MED ORDER — BLOOD PRESSURE MONITOR AUTOMAT DEVI
1.0000 [IU] | Freq: Once | 0 refills | Status: AC
  Filled 2023-03-26: qty 1, 1d supply, fill #0

## 2023-03-26 MED ORDER — AMLODIPINE BESYLATE 2.5 MG PO TABS: 2.5000 mg | ORAL_TABLET | Freq: Every day | ORAL | 3 refills | Status: DC

## 2023-03-26 NOTE — Patient Instructions (Addendum)
Medication Instructions:  Your physician has recommended you make the following change in your medication:  START: Amlodipine 2.5 mg once daily  *If you need a refill on your cardiac medications before your next appointment, please call your pharmacy*   Follow-Up: At [facility], you and your health needs are our priority.  As part of our continuing mission to provide you with exceptional heart care, we have created designated Provider Care Teams.  These Care Teams include your primary Cardiologist (physician) and Advanced Practice Providers (APPs -  Physician Assistants and Nurse Practitioners) who all work together to provide you with the care you need, when you need it.  Your next appointment:   12 week(s)  Provider:   Thomasene Ripple, DO [address], [place], [place] [address]   Other Instructions:

## 2023-03-26 NOTE — Progress Notes (Signed)
Cardio-Obstetrics Clinic  New Evaluation  Date:  03/26/2023   ID:  Jamie Gates, DOB [birth year], MRN [id number]  PCP:  Erick Alley, DO   [facility] Providers Cardiologist:  Thomasene Ripple, DO  Electrophysiologist:  None       Referring MD: Erick Alley, DO   Chief Complaint: " I am doing fine but have intermittent shortness of breath"  History of Present Illness:    Jamie Gates is a 23 y.o. female [G2P1101] who is being seen today for follow up visit. At her last visit was doing well.   I order her cardiac mri unfortunately she had not been able to get this testing done due to losing her insurance.   She is her for a follow up visit.    Prior CV Studies Reviewed: The following studies were reviewed today:  11/25/2021 IMPRESSIONS   1. Left ventricular ejection fraction, by estimation, is 40 to 45%. The left ventricle has mildly decreased function. The left ventricle  demonstrates global hypokinesis. The left ventricular internal cavity size was moderately dilated. Left ventricular  diastolic parameters are indeterminate. The average left ventricular global longitudinal strain is -16.7 %. The global longitudinal strain is  abnormal.   2. Right ventricular systolic function is normal. The right ventricular size is normal.   3. The mitral valve is normal in structure. Moderate to severe mitral valve regurgitation. No evidence of mitral stenosis.   4. The aortic valve is tricuspid. Aortic valve regurgitation is not visualized. No aortic stenosis is present.   5. The inferior vena cava is normal in size with greater than 50% respiratory variability, suggesting right atrial pressure of 3 mmHg.   FINDINGS   Left Ventricle: Left ventricular ejection fraction, by estimation, is 40 to 45%. The left ventricle has mildly decreased function. The left  ventricle demonstrates global hypokinesis. The average left ventricular global longitudinal strain is -16.7 %.  The global  longitudinal strain is abnormal. The left ventricular internal cavity size was moderately dilated. There is no left ventricular  hypertrophy. Left ventricular diastolic parameters are indeterminate.   Right Ventricle: The right ventricular size is normal. Right ventricular  systolic function is normal.   Left Atrium: Left atrial size was normal in size.   Right Atrium: Right atrial size was normal in size.   Pericardium: There is no evidence of pericardial effusion.   Mitral Valve: The mitral valve is normal in structure. Moderate to severe  mitral valve regurgitation. No evidence of mitral valve stenosis. MV peak  gradient, 5.0 mmHg. The mean mitral valve gradient is 2.0 mmHg.   Tricuspid Valve: The tricuspid valve is normal in structure. Tricuspid  valve regurgitation is trivial. No evidence of tricuspid stenosis.   Aortic Valve: The aortic valve is tricuspid. Aortic valve regurgitation is  not visualized. No aortic stenosis is present.   Pulmonic Valve: The pulmonic valve was normal in structure. Pulmonic valve  regurgitation is trivial. No evidence of pulmonic stenosis.   Aorta: The aortic root is normal in size and structure.   Venous: The inferior vena cava is normal in size with greater than 50%  respiratory variability, suggesting right atrial pressure of 3 mmHg.   IAS/Shunts: No atrial level shunt detected by color flow Doppler.      Past Medical History:  Diagnosis Date   Abnormal antibody titer (anti-Lewis antibodies) 09/30/2020   Adjustment disorder with depressed mood 09/30/2020   Cardiomegaly 12/18/2012   still working with cardiology-esp now with pregnancy   Cardiomyopathy,  nonischemic (HCC) 02/05/2021   Chlamydia 09/23/2018   Treated 09/23/2018   Gonorrhea 09/23/2018   Treated 09/23/2018   Heart failure with reduced ejection fraction (HCC) 01/05/2022   Her echo in 03/2021 showed low normal EF.  Recent echo back down mildy depressed 40-45% 11/25/2021.    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   etonogestrel (NEXPLANON) 68 MG IMPL implant 1 each by Subdermal route once.   ferrous sulfate (FERROUSUL) 325 (65 FE) MG tablet Take 1 tablet (325 mg total) by mouth every other day.   ibuprofen (ADVIL) 600 MG tablet Take 1 tablet (600 mg total) by mouth every 8 (eight) hours as needed for up to 30 doses for fever, headache, mild pain (pain score 1-3) or moderate pain (pain score 4-6) (Inflammation). Take 1 tablet 3 times daily as needed for inflammation of upper airways and/or pain.   ipratropium (ATROVENT) 0.03 % nasal spray Place 2 sprays into both nostrils every 12 (twelve) hours.   metoprolol succinate (TOPROL XL) 25 MG 24 hr tablet Take 1 tablet (25 mg total) by mouth daily.   potassium chloride (KLOR-CON) 10 MEQ tablet TAKE 1 TABLET BY MOUTH ONCE A WEEK   Prenatal 27-1 MG TABS Take 1 tablet by mouth daily.   senna-docusate (SENOKOT-S) 8.6-50 MG tablet Take 2 tablets by mouth daily.   [DISCONTINUED] amLODipine (NORVASC) 2.5 MG tablet Take 1 tablet (2.5 mg total) by mouth daily.   [DISCONTINUED] Blood Pressure Monitoring (BLOOD PRESSURE MONITOR AUTOMAT) DEVI 1 Units by Does not apply route once for 1 dose.     Allergies:   Patient has no known allergies.   Social History   Socioeconomic History   Marital status: Single    Spouse name: Not on file   Number of children: Not on file   Years  of education: Not on file   Highest education level: Not on file  Occupational History   Occupation: Conservation officer, nature    Comment: [employer]  Tobacco Use   Smoking status: Former    Current packs/day: 0.00    Types: Cigars, Cigarettes    Quit date: 09/03/2020    Years since quitting: 2.5   Smokeless tobacco: Never  Vaping Use   Vaping status: Never Used  Substance and Sexual Activity   Alcohol use: No   Drug use: Not Currently    Types: Marijuana   Sexual activity: Yes    Birth control/protection: None  Other Topics Concern   Not on file  Social History Narrative   Patient is living with her dad at this time in school still getting passing grades  Social Drivers of Corporate investment banker Strain: Low Risk  (01/10/2021)   Overall Financial Resource Strain (CARDIA)    Difficulty of Paying Living Expenses: Not hard at all  Food Insecurity: No Food Insecurity (05/26/2022)   Hunger Vital Sign    Worried About Running Out of Food in the Last Year: Never true    Ran Out of Food in the Last Year: Never true  Transportation Needs: No Transportation Needs (05/26/2022)   PRAPARE - Administrator, Civil Service (Medical): No    Lack of Transportation (Non-Medical): No  Physical Activity: Not on file  Stress: Not on file  Social Connections: Not on file      Family History  Problem Relation Age of Onset   Raynaud syndrome Mother    Diabetes Father    Hyperlipidemia Father    Hypertension Father    Asthma Neg Hx    Stroke Neg Hx       ROS:   Please see the history of present illness.      Review of Systems  Constitution: Negative for decreased appetite, fever and weight gain.  HENT: Negative for congestion, ear discharge, hoarse voice and sore throat.   Eyes: Negative for discharge, redness, vision loss in right eye and visual halos.  Cardiovascular: Reports shortness of breath on exertion.  Negative for chest pain,  leg swelling, orthopnea and palpitations.  Respiratory:  Negative for cough, hemoptysis, shortness of breath and snoring.   Endocrine: Negative for heat intolerance and polyphagia.  Hematologic/Lymphatic: Negative for bleeding problem. Does not bruise/bleed easily.  Skin: Negative for flushing, nail changes, rash and suspicious lesions.  Musculoskeletal: Negative for arthritis, joint pain, muscle cramps, myalgias, neck pain and stiffness.  Gastrointestinal: Negative for abdominal pain, bowel incontinence, diarrhea and excessive appetite.  Genitourinary: Negative for decreased libido, genital sores and incomplete emptying.  Neurological: Negative for brief paralysis, focal weakness, headaches and loss of balance.  Psychiatric/Behavioral: Negative for altered mental status, depression and suicidal ideas.  Allergic/Immunologic: Negative for HIV exposure and persistent infections.     Labs/EKG Reviewed:    EKG:   EKG is was ordered today.     TTE 03/2022 IMPRESSIONS   1. Left ventricular ejection fraction by 3D volume is 41 %. The left  ventricle has mild to moderately decreased function. The left ventricle  demonstrates global hypokinesis. The left ventricular internal cavity size  was moderately dilated. Left  ventricular diastolic parameters were normal. The average left ventricular  global longitudinal strain is -14.2 %. The global longitudinal strain is  abnormal.   2. Right ventricular systolic function is normal. The right ventricular  size is normal. There is normal pulmonary artery systolic pressure.   3. The mitral valve is normal in structure. Moderate mitral valve  regurgitation. No evidence of mitral stenosis.   4. The aortic valve is normal in structure. Aortic valve regurgitation is  not visualized. No aortic stenosis is present.   5. The inferior vena cava is normal in size with greater than 50%  respiratory variability, suggesting right atrial pressure of 3 mmHg.   Comparison(s): No significant change from prior study.  Prior images  reviewed side by side.   Recent Labs: 05/28/2022: BUN 7; Creatinine, Ser 0.77; Hemoglobin 10.1; Platelets 218; Potassium 4.1; Sodium 133   Recent Lipid Panel No results found for: "CHOL", "TRIG", "HDL", "CHOLHDL", "LDLCALC", "LDLDIRECT"  Physical Exam:    VS:  BP 138/78 (BP Location: Right Arm, Patient Position: Sitting,  Cuff Size: Normal)   Pulse 60   Ht 5\' 11"  (1.803 m)   Wt (!) 314 lb 14.4 oz (142.8 kg)   LMP  (LMP Unknown)   SpO2 98%   BMI 43.92 kg/m     Wt Readings from Last 3 Encounters:  03/26/23 (!) 314 lb 14.4 oz (142.8 kg)  07/31/22 (!) 308 lb (139.7 kg)  07/01/22 (!) 308 lb 14.4 oz (140.1 kg)     GEN:  Well nourished, well developed in no acute distress HEENT: Normal NECK: No JVD; No carotid bruits LYMPHATICS: No lymphadenopathy CARDIAC: RRR, no murmurs, rubs, gallops RESPIRATORY:  Clear to auscultation without rales, wheezing or rhonchi  ABDOMEN: Soft, non-tender, non-distended MUSCULOSKELETAL:  No edema; No deformity  SKIN: Warm and dry NEUROLOGIC:  Alert and oriented x 3 PSYCHIATRIC:  Normal affect    Risk Assessment/Risk Calculators:                  ASSESSMENT & PLAN:    Depressed ejection fraction Heart failure with reduced ejection fraction Obesity affecting pregnancy Moderate Mitral Regurgitation   Her recent echo 05/05/2022 EF still moderately depressed - 40-45%, mitral regurgitation.  She has stopped the  Continue her toprol xl and could not get her cardiac MR due to lost of coverage - will refer her to our social work.   Given his young patient suspect that this etiology may be nonischemic but will limited to any evaluation to understand her cardiomyopathy.  Treatment is paramount here for this patient.    She is hypertensive - we will need to get medication that is affordable - will start Amlodipine 2.5 mg - once arrangement is made will optimize her medication. May need to reasses Ef and optimize med regimen   I have  educated patient about warning signs understand if she gets worsening shortness of breath to notify my office.  Lifestyle modification advised   Follow-up in 6 months or sooner if needed.  Patient Instructions  Medication Instructions:  Your physician has recommended you make the following change in your medication:  START: Amlodipine 2.5 mg once daily  *If you need a refill on your cardiac medications before your next appointment, please call your pharmacy*   Follow-Up: At [facility], you and your health needs are our priority.  As part of our continuing mission to provide you with exceptional heart care, we have created designated Provider Care Teams.  These Care Teams include your primary Cardiologist (physician) and Advanced Practice Providers (APPs -  Physician Assistants and Nurse Practitioners) who all work together to provide you with the care you need, when you need it.  Your next appointment:   12 week(s)  Provider:   Thomasene Ripple, DO [address], [place], [place] [address]   Other Instructions:      Dispo:  No follow-ups on file.   Medication Adjustments/Labs and Tests Ordered: Current medicines are reviewed at length with the patient today.  Concerns regarding medicines are outlined above.  Tests Ordered: Orders Placed This Encounter  Procedures   Referral to HRT/VAS Care Navigation   EKG 12-Lead   Medication Changes: Meds ordered this encounter  Medications   DISCONTD: amLODipine (NORVASC) 2.5 MG tablet    Sig: Take 1 tablet (2.5 mg total) by mouth daily.    Dispense:  90 tablet    Refill:  3   DISCONTD: Blood Pressure Monitoring (BLOOD PRESSURE MONITOR AUTOMAT) DEVI    Sig: 1 Units by Does not apply  route once for 1 dose.    Dispense:  1 each    Refill:  0   Blood Pressure Monitoring (BLOOD PRESSURE MONITOR AUTOMAT) DEVI    Sig: Use as directed    Dispense:  1 each    Refill:  0   amLODipine (NORVASC) 2.5 MG tablet    Sig: Take 1  tablet (2.5 mg total) by mouth daily.    Dispense:  90 tablet    Refill:  3

## 2023-03-29 ENCOUNTER — Telehealth: Payer: Self-pay | Admitting: Licensed Clinical Social Worker

## 2023-03-29 NOTE — Progress Notes (Addendum)
Heart and Vascular Care Navigation  03/29/2023  Haileigh Pitz [birth year] [id number]  Reason for Referral:  Patient is participating in a Managed Medicaid Plan: No, self pay only  Engaged with patient by telephone for initial visit for Heart and Vascular Care Coordination.                                                                                                   Assessment:                               LCSW reached pt this morning at about 830am, she requested I call her back at 11am, was able to reach her again after 11am at [phone]. When I reached her a second time she was in the car with her partner Zella Ball, she shares it is okay for me to speak with her and him. Confirmed they both can hear me. Introduced self, role, reason for call. Confirmed PCP and current home address. Resides with parents, younger brother, her child and partner. They share they are looking for another place to live. She previously had Medicaid but this coverage has lapsed due to a "paperwork thing." It sounds as though pt was to recertify with DSS and either had an issue doing so or did not do it therefore she was left without coverage.   We discussed referral to DSS Caseworkers at [facility] building, I shared they may be able to assist pt so she does not have to go to DSS to apply but cautioned if she was still assigned to another caseworker they could give her that information but she would need to contact them to complete application.   No additional questions/concerns at this time but discussed if not eligible for Medicaid we can assist her with applying for [facility]/ [employer] programs and additional medication assistance.   I will f/u to ensure referral and resources completed.         HRT/VAS Care Coordination     Patients Home Cardiology Office [facility]   Outpatient Care Team Social Worker   Social Worker Name: Octavio Graves, [place], [phone]    Living arrangements for the past 2 months Single Family Home   Lives with: Minor Children; Parents; Siblings   Patient Current Insurance Coverage Self-Pay   Patient Has Concern With Paying Medical Bills Yes   Patient Concerns With Medical Bills previously had Medicaid, has lapsed and has no current coverage   Medical Bill Referrals: Medicaid referral, discussed additional [facility] Financial Assistance if not eligible for Medicaid   Does Patient Have Prescription Coverage? No   Home Assistive Devices/Equipment None       Social History:  SDOH Screenings   Food Insecurity: No Food Insecurity (03/29/2023)  Housing: Low Risk  (03/29/2023)  Transportation Needs: No Transportation Needs (03/29/2023)  Utilities: Not At Risk (03/29/2023)  Depression (PHQ2-9): Low Risk  (05/20/2022)  Financial Resource Strain: Low Risk  (03/29/2023)  Tobacco Use: Medium Risk (03/26/2023)  Health Literacy: Adequate Health Literacy (03/29/2023)    SDOH Interventions: Financial Resources:  Financial Strain Interventions: Intervention Not Indicated Medicaid referral for insurance  Food Insecurity:  Food Insecurity Interventions: [employer] Provided (will send [employer] card from [employer] if needs to apply)  Housing Insecurity:  Housing Interventions: Other (Comment) (pt did share they are looking for somewhere else to live at this time, encouraged her to let me know if any resources needed or if she needs to update her address)  Transportation:   Transportation Interventions: Intervention Not Indicated    Other Care Navigation Interventions:     Provided Pharmacy assistance resources  Discussed Medicaid referral, if not eligible then discussed alternative assistance such as [facility] and [facility]   Follow-up plan:   LCSW has made referral to Medicaid caseworker Cathlyn Parsons at [employer]. She will f/u to pt and see if she  can assist with Medicaid re-certification or connect her with assigned DSS caseworker to re-certify. I also have sent pt one of my cards and a SNAP card from [employer] to sign up for food benefits if not already receiving. I will f/u to ensure referral completed and SNAP card received.

## 2023-03-29 NOTE — Telephone Encounter (Signed)
H&V Care Navigation CSW Progress Note  Clinical Social Worker contacted patient by phone to f/u on referral for financial challenges related to loss of insurance. Pt previously had Medicaid. LCSW was able to reach pt this morning, sounded like I woke her up and offered to call her back at a later time. She requests about 11am, will call her back at that time to discuss options.  Patient is participating in a Managed Medicaid Plan:  No, previously enrolled, no current coverage  SDOH Screenings   Food Insecurity: No Food Insecurity (05/26/2022)  Housing: Low Risk  (05/26/2022)  Transportation Needs: No Transportation Needs (05/26/2022)  Utilities: Not At Risk (05/26/2022)  Depression (PHQ2-9): Low Risk  (05/20/2022)  Financial Resource Strain: Low Risk  (01/10/2021)  Tobacco Use: Medium Risk (03/26/2023)     Octavio Graves, MSW, LCSW Clinical Social Worker II [facility] Heart/Vascular Care Navigation  [phone]- work cell phone (preferred) [phone]- desk phone

## 2023-03-30 ENCOUNTER — Telehealth: Payer: Self-pay | Admitting: Licensed Clinical Social Worker

## 2023-03-30 NOTE — Telephone Encounter (Signed)
H&V Care Navigation CSW Progress Note  Clinical Social Worker contacted patient by phone to f/u on these benefits. I was able to reach her at [phone]. She confirms she has update from DSS regarding medicaid. Let her know I have asked it be placed on her chart but if she does receive bills etc that appear to have not been run through her Medicaid I have asked her to call them and ensure they rebill using her medicaid benefits. I also reached out to pharmacy team to have it added to the system- and encouraged her to go and get her medications today. Routed updates to provider and RN so if pt has urgent need for testing or additional medication management can have that ordered now.   Patient is participating in a Managed Medicaid Plan:  Yes- [employer] [place], [id number] R   SDOH Screenings   Food Insecurity: No Food Insecurity (03/29/2023)  Housing: Low Risk  (03/29/2023)  Transportation Needs: No Transportation Needs (03/29/2023)  Utilities: Not At Risk (03/29/2023)  Depression (PHQ2-9): Low Risk  (05/20/2022)  Financial Resource Strain: Low Risk  (03/29/2023)  Tobacco Use: Medium Risk (03/26/2023)  Health Literacy: Adequate Health Literacy (03/29/2023)    Octavio Graves, MSW, LCSW Clinical Social Worker II [facility] Heart/Vascular Care Navigation  [phone]- work cell phone (preferred) [phone]- desk phone

## 2023-03-30 NOTE — Telephone Encounter (Signed)
H&V Care Navigation CSW Progress Note  Clinical Social Worker  received email from Cathlyn Parsons, DSS Caseworker,  to provide the following update regarding pt Medicaid benefits.   "I wanted to let you know that I connected with the caseworker yesterday and Jamie Gates's benefits have been restored.  Previously her Medicaid ended 07/31/22 as it was not reading the pregnancy/birth on her case.  She now has Pregnancy Medicaid 08/01/22 through 05/31/23 and then full [place] Medicaid from 06/01/23 - 05/30/24.  I will give her a call shortly but please feel free to let her know if you are in touch with her."  I will f/u with financial counseling to add to account and rebill.   Patient is participating in a Managed Medicaid Plan:  [name], [id number] R  SDOH Screenings   Food Insecurity: No Food Insecurity (03/29/2023)  Housing: Low Risk  (03/29/2023)  Transportation Needs: No Transportation Needs (03/29/2023)  Utilities: Not At Risk (03/29/2023)  Depression (PHQ2-9): Low Risk  (05/20/2022)  Financial Resource Strain: Low Risk  (03/29/2023)  Tobacco Use: Medium Risk (03/26/2023)  Health Literacy: Adequate Health Literacy (03/29/2023)    Octavio Graves, MSW, LCSW Clinical Social Worker II [facility] Heart/Vascular Care Navigation  [phone]- work cell phone (preferred) [phone]- desk phone

## 2023-04-06 ENCOUNTER — Telehealth: Payer: Self-pay | Admitting: Licensed Clinical Social Worker

## 2023-04-06 NOTE — Telephone Encounter (Signed)
 H&V Care Navigation CSW Progress Note  Clinical Social Worker contacted patient by phone to f/u on medication sent to [facility], doesn't look like it has been picked up by pt yet. Called and spoke with pt today at [phone]. She hasn't picked up medication yet. Requested I send phone number to ensure medication still there or have it refilled.   Sent [facility] pharmacy number but then noted thad it had been sent to the [facility] pharmacy which is actually a different number. All other medications are going to [facility] [address]. Texted pt and asked for clarification of where she would like it filled and provided [facility] number as needed  Patient is participating in a Managed Medicaid Plan:  Yes- [employer] [place]  SDOH Screenings   Food Insecurity: No Food Insecurity (03/29/2023)  Housing: Low Risk  (03/29/2023)  Transportation Needs: No Transportation Needs (03/29/2023)  Utilities: Not At Risk (03/29/2023)  Depression (PHQ2-9): Low Risk  (05/20/2022)  Financial Resource Strain: Low Risk  (03/29/2023)  Tobacco Use: Medium Risk (03/26/2023)  Health Literacy: Adequate Health Literacy (03/29/2023)    Marit Lark, MSW, LCSW Clinical Social Worker II [facility] Heart/Vascular Care Navigation  [phone]- work cell phone (preferred) [phone]- desk phone

## 2023-04-07 ENCOUNTER — Other Ambulatory Visit (HOSPITAL_COMMUNITY): Payer: Self-pay

## 2023-04-12 ENCOUNTER — Telehealth: Payer: Self-pay | Admitting: Licensed Clinical Social Worker

## 2023-04-12 NOTE — Telephone Encounter (Signed)
 H&V Care Navigation CSW Progress Note  Clinical Social Worker contacted patient by phone to f/u on medications. Note amlodipine  was never picked up. Left voicemail at [phone] and offered assistance for clinical staff to send to alternate pharmacy or assist with connecting pt to a pharmacy providing Medicaid waivers.  Patient is participating in a Managed Medicaid Plan:  Yes-  healthy blue  SDOH Screenings   Food Insecurity: No Food Insecurity (03/29/2023)  Housing: Low Risk  (03/29/2023)  Transportation Needs: No Transportation Needs (03/29/2023)  Utilities: Not At Risk (03/29/2023)  Depression (PHQ2-9): Low Risk  (05/20/2022)  Financial Resource Strain: Low Risk  (03/29/2023)  Tobacco Use: Medium Risk (03/26/2023)  Health Literacy: Adequate Health Literacy (03/29/2023)    Nathen Balder, MSW, LCSW Clinical Social Worker II [facility] Heart/Vascular Care Navigation  [phone]- work cell phone (preferred) [phone]- desk phone

## 2023-04-14 ENCOUNTER — Telehealth: Payer: Self-pay | Admitting: Licensed Clinical Social Worker

## 2023-04-14 NOTE — Telephone Encounter (Signed)
H&V Care Navigation CSW Progress Note  Clinical Social Worker contacted patient by phone to  f/u on medications. Note amlodipine was still never picked up. Left 2nd voicemail at [phone] and offered assistance for clinical staff to send to alternate pharmacy or assist with connecting pt to a pharmacy providing Medicaid waivers. Will try one more time to f/u on this.    Patient is participating in a Managed Medicaid Plan:  Yes-  healthy blue  SDOH Screenings   Food Insecurity: No Food Insecurity (03/29/2023)  Housing: Low Risk  (03/29/2023)  Transportation Needs: No Transportation Needs (03/29/2023)  Utilities: Not At Risk (03/29/2023)  Depression (PHQ2-9): Low Risk  (05/20/2022)  Financial Resource Strain: Low Risk  (03/29/2023)  Tobacco Use: Medium Risk (03/26/2023)  Health Literacy: Adequate Health Literacy (03/29/2023)    Octavio Graves, MSW, LCSW Clinical Social Worker II [facility] Heart/Vascular Care Navigation  [phone]- work cell phone (preferred) [phone]- desk phone'

## 2023-04-19 ENCOUNTER — Telehealth: Payer: Self-pay | Admitting: Licensed Clinical Social Worker

## 2023-04-19 NOTE — Telephone Encounter (Signed)
 H&V Care Navigation CSW Progress Note  Clinical Social Worker contacted patient by phone to  f/u on medications. Note amlodipine was still never picked up. Left 3rd voicemail at [phone] and offered assistance for clinical staff to send to alternate pharmacy or assist with connecting pt to a pharmacy providing Medicaid waivers. Remain available should pt re-engage with Care Navigation team.   Patient is participating in a Managed Medicaid Plan:  Yes- Healthy Blue  SDOH Screenings   Food Insecurity: No Food Insecurity (03/29/2023)  Housing: Low Risk  (03/29/2023)  Transportation Needs: No Transportation Needs (03/29/2023)  Utilities: Not At Risk (03/29/2023)  Depression (PHQ2-9): Low Risk  (05/20/2022)  Financial Resource Strain: Low Risk  (03/29/2023)  Tobacco Use: Medium Risk (03/26/2023)  Health Literacy: Adequate Health Literacy (03/29/2023)    Octavio Graves, MSW, LCSW Clinical Social Worker II [facility] Heart/Vascular Care Navigation  [phone]- work cell phone (preferred) [phone]- desk phone

## 2023-04-22 ENCOUNTER — Other Ambulatory Visit (HOSPITAL_COMMUNITY): Payer: Self-pay

## 2023-05-03 ENCOUNTER — Ambulatory Visit
Admission: EM | Admit: 2023-05-03 | Discharge: 2023-05-03 | Disposition: A | Discharge status: Home / Self Care | Attending: Family Medicine | Admitting: Family Medicine

## 2023-05-03 DIAGNOSIS — R109 Unspecified abdominal pain: Secondary | ICD-10-CM | POA: Diagnosis not present

## 2023-05-03 DIAGNOSIS — N390 Urinary tract infection, site not specified: Secondary | ICD-10-CM | POA: Diagnosis not present

## 2023-05-03 LAB — POCT URINALYSIS DIP (MANUAL ENTRY)
Bilirubin, UA: NEGATIVE
Glucose, UA: NEGATIVE mg/dL
Ketones, POC UA: NEGATIVE mg/dL
Nitrite, UA: NEGATIVE
Protein Ur, POC: 100 mg/dL — AB
Spec Grav, UA: 1.025 (ref 1.010–1.025)
Urobilinogen, UA: 0.2 U/dL
pH, UA: 7 (ref 5.0–8.0)

## 2023-05-03 LAB — POCT URINE PREGNANCY: Preg Test, Ur: NEGATIVE

## 2023-05-03 MED ORDER — ONDANSETRON 4 MG PO TBDP
4.0000 mg | ORAL_TABLET | Freq: Once | ORAL | Status: AC
  Administered 2023-05-03: 4 mg via ORAL

## 2023-05-03 MED ORDER — KETOROLAC TROMETHAMINE 30 MG/ML IJ SOLN
30.0000 mg | Freq: Once | INTRAMUSCULAR | Status: AC
  Administered 2023-05-03: 30 mg via INTRAMUSCULAR

## 2023-05-03 MED ORDER — CEPHALEXIN 500 MG PO CAPS: 500.0000 mg | ORAL_CAPSULE | Freq: Two times a day (BID) | ORAL | 0 refills | Status: AC

## 2023-05-03 MED ORDER — ONDANSETRON HCL 4 MG PO TABS: 4.0000 mg | ORAL_TABLET | Freq: Three times a day (TID) | ORAL | 0 refills | Status: DC | PRN

## 2023-05-03 MED ORDER — CEFTRIAXONE SODIUM 1 G IJ SOLR
1.0000 g | Freq: Once | INTRAMUSCULAR | Status: AC
  Administered 2023-05-03: 1 g via INTRAMUSCULAR

## 2023-05-03 NOTE — ED Provider Notes (Signed)
 [facility]    CSN: [id number] Arrival date & time: 05/03/23  1126      History   Chief Complaint Chief Complaint  Patient presents with   Back Pain   Emesis    HPI Jamie Gates is a 23 y.o. female.   Patient here for evaluation of lower back pain x 2 days. Patient seen last month at [facility] with back pain however, symptoms involved the upper thoracic region of the back. She says that she has had increased urinary frequency over the last few days however her back started hurting approximately 2 days ago.  She has applied lidocaine patches without any improvement of pain.  She reports vomiting once today however while here at urgent care she has subsequently vomited again and endorses right flank pain.  She is afebrile.  Denies a history of recurrent UTIs although has had a urinary tract infection in the past.  Past Medical History:  Diagnosis Date   Abnormal antibody titer (anti-Lewis antibodies) 09/30/2020   Adjustment disorder with depressed mood 09/30/2020   Cardiomegaly 12/18/2012   still working with cardiology-esp now with pregnancy   Cardiomyopathy, nonischemic (HCC) 02/05/2021   Chlamydia 09/23/2018   Treated 09/23/2018   Gonorrhea 09/23/2018   Treated 09/23/2018   Heart failure with reduced ejection fraction (HCC) 01/05/2022   Her echo in 03/2021 showed low normal EF.  Recent echo back down mildy depressed 40-45% 11/25/2021.    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   Migraines    Mitral regurgitation    Morbid obesity (HCC)    Ovarian cyst    Pregnancy induced hypertension     Patient Active Problem List   Diagnosis Date Noted   Encounter for initial prescription of implantable subdermal contraceptive 05/29/2022   Nexplanon insertion 05/29/2022   GBS (group B Streptococcus carrier), +RV culture, currently pregnant 05/14/2022   Red Chart Patient 04/21/2022   BMI 45.0-49.9, adult (HCC) 03/23/2022   Anemia of pregnancy 03/10/2022   Atypical squamous cell  changes of undetermined significance (ASCUS) with negative high risk human papilloma virus (HPV) pap 01/05/2022 01/08/2022   Heart failure with reduced ejection fraction (HCC) 01/05/2022   Supervision of high risk pregnancy, antepartum 12/10/2021   History of maternal cardiomyopathy, currently pregnant 11/06/2021   Vaginal delivery 03/14/2021   History of IUFD at 32 weeks 03/11/2021   Mitral regurgitation of mother during pregnancy 03/11/2021   Cardiomyopathy, nonischemic (HCC) 02/05/2021   Rh negative state in antepartum period 01/07/2021   Adjustment disorder with depressed mood 09/30/2020   Abnormal antibody titer (anti-Lewis antibodies) 09/30/2020   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   Right ovarian cyst 04/13/2018   Cardiomegaly 12/18/2012   Maternal morbid obesity, antepartum (HCC) 12/04/2010    Past Surgical History:  Procedure Laterality Date   NEXPLANON TRAY  05/29/2022   TYMPANOSTOMY TUBE PLACEMENT      OB History     Gravida  2   Para  2   Term  1   Preterm  1   AB  0   Living  1      SAB  0   IAB  0   Ectopic  0   Multiple  0   Live Births  1            Home Medications    Prior to Admission medications   Medication Sig Start Date End Date Taking? Authorizing Provider  cephALEXin (KEFLEX) 500 MG capsule Take 1 capsule (500 mg total) by mouth 2 (two)  times daily for 7 days. 05/03/23 05/10/23 Yes Bing Neighbors, NP  ondansetron (ZOFRAN) 4 MG tablet Take 1 tablet (4 mg total) by mouth every 8 (eight) hours as needed for nausea or vomiting. 05/03/23  Yes Bing Neighbors, NP  amLODipine (NORVASC) 2.5 MG tablet Take 1 tablet (2.5 mg total) by mouth daily. 03/26/23   Tobb, Kardie, DO  etonogestrel (NEXPLANON) 68 MG IMPL implant 1 each by Subdermal route once.    [provider]  ferrous sulfate (FERROUSUL) 325 (65 FE) MG tablet Take 1 tablet (325 mg total) by mouth every other day. 03/10/22   Milas Hock, MD  ibuprofen (ADVIL) 600 MG tablet Take 1  tablet (600 mg total) by mouth every 8 (eight) hours as needed for up to 30 doses for fever, headache, mild pain (pain score 1-3) or moderate pain (pain score 4-6) (Inflammation). Take 1 tablet 3 times daily as needed for inflammation of upper airways and/or pain. 03/08/23   Theadora Rama Scales, PA-C  ipratropium (ATROVENT) 0.03 % nasal spray Place 2 sprays into both nostrils every 12 (twelve) hours. 11/13/22   Viviano Simas, FNP  metoprolol succinate (TOPROL XL) 25 MG 24 hr tablet Take 1 tablet (25 mg total) by mouth daily. 05/30/22   Ndulue, Chiagoziem J, MD  potassium chloride (KLOR-CON) 10 MEQ tablet TAKE 1 TABLET BY MOUTH ONCE A WEEK 04/20/22   Tobb, Kardie, DO  Prenatal 27-1 MG TABS Take 1 tablet by mouth daily. 11/05/21   Erick Alley, DO  senna-docusate (SENOKOT-S) 8.6-50 MG tablet Take 2 tablets by mouth daily. 05/29/22   Ndulue, Nadene Rubins, MD    Family History Family History  Problem Relation Age of Onset   Raynaud syndrome Mother    Diabetes Father    Hyperlipidemia Father    Hypertension Father    Asthma Neg Hx    Stroke Neg Hx     Social History Social History   Tobacco Use   Smoking status: Former    Current packs/day: 0.00    Types: Cigars, Cigarettes    Quit date: 09/03/2020    Years since quitting: 2.6   Smokeless tobacco: Never  Vaping Use   Vaping status: Never Used  Substance Use Topics   Alcohol use: No   Drug use: Not Currently    Types: Marijuana     Allergies   Patient has no known allergies.   Review of Systems Review of Systems  Gastrointestinal:  Positive for vomiting.  Musculoskeletal:  Positive for back pain.     Physical Exam Triage Vital Signs ED Triage Vitals  Encounter Vitals Group     BP 05/03/23 1306 130/89     Systolic BP Percentile --      Diastolic BP Percentile --      Pulse Rate 05/03/23 1306 67     Resp 05/03/23 1306 16     Temp 05/03/23 1306 97.9 F (36.6 C)     Temp Source 05/03/23 1306 Oral     SpO2 05/03/23 1306 98  %     Weight --      Height --      Head Circumference --      Peak Flow --      Pain Score 05/03/23 1304 8     Pain Loc --      Pain Education --      Exclude from Growth Chart --    No data found.  Updated Vital Signs BP 130/89 (BP Location: Right  Arm)   Pulse 67   Temp 97.9 F (36.6 C) (Oral)   Resp 16   LMP  (LMP Unknown)   SpO2 98%   Breastfeeding No   Visual Acuity Right Eye Distance:   Left Eye Distance:   Bilateral Distance:    Right Eye Near:   Left Eye Near:    Bilateral Near:     Physical Exam Vitals reviewed.  Constitutional:      Appearance: Normal appearance.  HENT:     Head: Normocephalic and atraumatic.  Eyes:     Extraocular Movements: Extraocular movements intact.     Pupils: Pupils are equal, round, and reactive to light.  Cardiovascular:     Rate and Rhythm: Normal rate and regular rhythm.  Pulmonary:     Effort: Pulmonary effort is normal.     Breath sounds: Normal breath sounds.  Abdominal:     Tenderness: There is right CVA tenderness.  Musculoskeletal:     Lumbar back: Tenderness (Right lower back tenderness with palpation) present.  Skin:    General: Skin is warm.  Neurological:     General: No focal deficit present.     Mental Status: She is alert.     UC Treatments / Results  Labs (all labs ordered are listed, but only abnormal results are displayed) Labs Reviewed  POCT URINALYSIS DIP (MANUAL ENTRY) - Abnormal; Notable for the following components:      Result Value   Clarity, UA cloudy (*)    Blood, UA moderate (*)    Protein Ur, POC =100 (*)    Leukocytes, UA Small (1+) (*)    All other components within normal limits  POCT URINE PREGNANCY - Normal  URINE CULTURE    EKG   Radiology No results found.  Procedures Procedures (including critical care time)  Medications Ordered in UC Medications  ketorolac (TORADOL) 30 MG/ML injection 30 mg (has no administration in time range)  cefTRIAXone (ROCEPHIN)  injection 1 g (has no administration in time range)  ondansetron (ZOFRAN-ODT) disintegrating tablet 4 mg (4 mg Oral Given 05/03/23 1331)    Initial Impression / Assessment and Plan / UC Course  I have reviewed the triage vital signs and the nursing notes.  Pertinent labs & imaging results that were available during my care of the patient were reviewed by me and considered in my medical decision making (see chart for details).   Acute UTI with right flank pain  UA abnormal and findings consistent with UTI. Empiric antibiotic treatment initiated with Keflex concern for possible evolving pyelonephritis given patient is symptomatic with nausea and vomiting.  Encouraged increase intake of water.  Urine culture pending. ER if symptoms become severe. Follow-up with PCP if symptoms do not completely resolve.  Final Clinical Impressions(s) / UC Diagnoses   Final diagnoses:  Acute UTI  Right flank pain     Discharge Instructions      Your urine culture will result within 3 days.  We will notify you if there is any changes in treatment based on urine culture results.  If you do not hear anything from our clinic continue with current treatment as prescribed.  Hydrate well with fluids.  If you develop any fever,  or nausea or vomiting continues despite treatment with antibiotics or if you develop severe abdominal pain go immediately to the emergency department.       ED Prescriptions     Medication Sig Dispense Auth. Provider   cephALEXin (KEFLEX) 500 MG capsule  Take 1 capsule (500 mg total) by mouth 2 (two) times daily for 7 days. 14 capsule Bing Neighbors, NP   ondansetron (ZOFRAN) 4 MG tablet Take 1 tablet (4 mg total) by mouth every 8 (eight) hours as needed for nausea or vomiting. 20 tablet Bing Neighbors, NP      PDMP not reviewed this encounter.   Bing Neighbors, NP 05/03/23 [phone]

## 2023-05-03 NOTE — Discharge Instructions (Addendum)
 Your urine culture will result within 3 days.  We will notify you if there is any changes in treatment based on urine culture results.  If you do not hear anything from our clinic continue with current treatment as prescribed.  Hydrate well with fluids.  If you develop any fever,  or nausea or vomiting continues despite treatment with antibiotics or if you develop severe abdominal pain go immediately to the emergency department.

## 2023-05-03 NOTE — ED Triage Notes (Signed)
 Pt presents with lower back pain. Pt states she has tried lidocaine patches but they only provide relief for a couple hours. Symptoms onset 2 days ago. Pt has not taken OTC med today to relieve symptoms.

## 2023-05-06 LAB — URINE CULTURE
Culture: >=100000 — AB
Special Requests: NORMAL

## 2023-05-17 ENCOUNTER — Other Ambulatory Visit: Payer: Self-pay

## 2023-05-17 ENCOUNTER — Emergency Department (HOSPITAL_COMMUNITY)
Admission: EM | Admit: 2023-05-17 | Discharge: 2023-05-17 | Disposition: A | Discharge status: Home / Self Care | Attending: Emergency Medicine | Admitting: Emergency Medicine

## 2023-05-17 DIAGNOSIS — R112 Nausea with vomiting, unspecified: Secondary | ICD-10-CM | POA: Diagnosis not present

## 2023-05-17 DIAGNOSIS — R197 Diarrhea, unspecified: Secondary | ICD-10-CM | POA: Diagnosis not present

## 2023-05-17 LAB — COMPREHENSIVE METABOLIC PANEL
ALT: 12 U/L (ref 0–44)
AST: 15 U/L (ref 15–41)
Albumin: 4 g/dL (ref 3.5–5.0)
Alkaline Phosphatase: 67 U/L (ref 38–126)
Anion gap: 7 (ref 5–15)
BUN: 12 mg/dL (ref 6–20)
CO2: 21 mmol/L — ABNORMAL LOW (ref 22–32)
Calcium: 8.5 mg/dL — ABNORMAL LOW (ref 8.9–10.3)
Chloride: 107 mmol/L (ref 98–111)
Creatinine, Ser: 0.72 mg/dL (ref 0.44–1.00)
GFR, Estimated: >60 mL/min (ref >60)
Glucose, Bld: 101 mg/dL — ABNORMAL HIGH (ref 70–99)
Potassium: 4.1 mmol/L (ref 3.5–5.1)
Sodium: 135 mmol/L (ref 135–145)
Total Bilirubin: 0.9 mg/dL (ref 0.0–1.2)
Total Protein: 8.1 g/dL (ref 6.5–8.1)

## 2023-05-17 LAB — CBC
HCT: 41.6 % (ref 36.0–46.0)
Hemoglobin: 13 g/dL (ref 12.0–15.0)
MCH: 26.6 pg (ref 26.0–34.0)
MCHC: 31.3 g/dL (ref 30.0–36.0)
MCV: 85.2 fL (ref 80.0–100.0)
Platelets: 264 10*3/uL (ref 150–400)
RBC: 4.88 MIL/uL (ref 3.87–5.11)
RDW: 14.7 % (ref 11.5–15.5)
WBC: 11 10*3/uL — ABNORMAL HIGH (ref 4.0–10.5)
nRBC: 0 % (ref 0.0–0.2)

## 2023-05-17 LAB — URINALYSIS, ROUTINE W REFLEX MICROSCOPIC
Bilirubin Urine: NEGATIVE
Glucose, UA: NEGATIVE mg/dL
Hgb urine dipstick: NEGATIVE
Ketones, ur: NEGATIVE mg/dL
Leukocytes,Ua: NEGATIVE
Nitrite: NEGATIVE
Protein, ur: NEGATIVE mg/dL
Specific Gravity, Urine: 1.021 (ref 1.005–1.030)
pH: 6 (ref 5.0–8.0)

## 2023-05-17 LAB — LIPASE, BLOOD: Lipase: 24 U/L (ref 11–51)

## 2023-05-17 LAB — HCG, SERUM, QUALITATIVE: Preg, Serum: NEGATIVE

## 2023-05-17 MED ORDER — ONDANSETRON 4 MG PO TBDP
4.0000 mg | ORAL_TABLET | Freq: Once | ORAL | Status: AC
  Administered 2023-05-17: 4 mg via ORAL
  Filled 2023-05-17: qty 1

## 2023-05-17 MED ORDER — ONDANSETRON HCL 4 MG PO TABS: 4.0000 mg | ORAL_TABLET | Freq: Three times a day (TID) | ORAL | 0 refills | Status: DC | PRN

## 2023-05-17 MED ORDER — DICYCLOMINE HCL 20 MG PO TABS: 20.0000 mg | ORAL_TABLET | Freq: Two times a day (BID) | ORAL | 0 refills | Status: DC

## 2023-05-17 NOTE — Discharge Instructions (Addendum)
 General instructions  Wash your hands often, especially after having diarrhea or vomiting. If soap and water are not available, use hand sanitizer. Make sure that all people in your household wash their hands well and often. Take over-the-counter and prescription medicines only as told by your health care provider. Rest at home while you recover. Watch your condition for any changes. Take a warm bath to relieve any burning or pain from frequent diarrhea episodes. Keep all follow-up visits. This is important. Contact a health care provider if you: Cannot keep fluids down. Have symptoms that get worse. Have new symptoms. Feel light-headed or dizzy. Have muscle cramps. Get help right away if you: Have chest pain. Have trouble breathing or you are breathing very quickly. Have a fast heartbeat. Feel extremely weak or you faint. Have a severe headache, a stiff neck, or both. Have a rash. Have severe pain, cramping, or bloating in your abdomen. Have skin that feels cold and clammy. Feel confused. Have pain when you urinate. Have signs of dehydration, such as: Dark urine, very little urine, or no urine. Cracked lips. Dry mouth. Sunken eyes. Sleepiness. Weakness. Have signs of bleeding, such as: Seeing blood in your vomit. Having vomit that looks like coffee grounds. Having bloody or black stools or stools that look like tar. These symptoms may be an emergency. Get help right away. Call 911. Do not wait to see if the symptoms will go away. Do not drive yourself to the hospital.

## 2023-05-17 NOTE — ED Provider Notes (Signed)
 [place] [facility]ENC[facility] DEPARTMENT AT [facility] Provider Note   CSN: [id number] Arrival date & time: 05/17/23  1439     History  Chief Complaint  Patient presents with   Nausea   Emesis   Diarrhea    Jamie Gates is a 23 y.o. female who presents emergency department chief complaint of not nausea vomiting and diarrhea.  She had onset of symptoms beginning yesterday with associated abdominal cramping.  She has several episodes of watery diarrhea and vomiting has been and able to hold down her medications.  The patient denies severe abdominal pain.  She is upset because she has been waiting a while and has been vomiting without any relief.  She denies cough or fever.   Emesis Associated symptoms: diarrhea   Diarrhea Associated symptoms: vomiting        Home Medications Prior to Admission medications   Medication Sig Start Date End Date Taking? Authorizing Provider  amLODipine (NORVASC) 2.5 MG tablet Take 1 tablet (2.5 mg total) by mouth daily. 03/26/23   Tobb, Kardie, DO  etonogestrel (NEXPLANON) 68 MG IMPL implant 1 each by Subdermal route once.    [provider]  ferrous sulfate (FERROUSUL) 325 (65 FE) MG tablet Take 1 tablet (325 mg total) by mouth every other day. 03/10/22   Milas Hock, MD  ibuprofen (ADVIL) 600 MG tablet Take 1 tablet (600 mg total) by mouth every 8 (eight) hours as needed for up to 30 doses for fever, headache, mild pain (pain score 1-3) or moderate pain (pain score 4-6) (Inflammation). Take 1 tablet 3 times daily as needed for inflammation of upper airways and/or pain. 03/08/23   Theadora Rama Scales, PA-C  ipratropium (ATROVENT) 0.03 % nasal spray Place 2 sprays into both nostrils every 12 (twelve) hours. 11/13/22   Viviano Simas, FNP  metoprolol succinate (TOPROL XL) 25 MG 24 hr tablet Take 1 tablet (25 mg total) by mouth daily. 05/30/22   Ndulue, Chiagoziem J, MD  ondansetron (ZOFRAN) 4 MG tablet Take 1 tablet (4 mg total) by mouth  every 8 (eight) hours as needed for nausea or vomiting. 05/03/23   Bing Neighbors, NP  potassium chloride (KLOR-CON) 10 MEQ tablet TAKE 1 TABLET BY MOUTH ONCE A WEEK 04/20/22   Tobb, Kardie, DO  Prenatal 27-1 MG TABS Take 1 tablet by mouth daily. 11/05/21   Erick Alley, DO  senna-docusate (SENOKOT-S) 8.6-50 MG tablet Take 2 tablets by mouth daily. 05/29/22   Ndulue, Nadene Rubins, MD      Allergies    Patient has no known allergies.    Review of Systems   Review of Systems  Gastrointestinal:  Positive for diarrhea and vomiting.    Physical Exam Updated Vital Signs BP (!) 152/110 (BP Location: Left Arm)   Pulse (!) 101   Temp 98 F (36.7 C) (Oral)   Resp 18   Ht 5\' 11"  (1.803 m)   Wt 136.1 kg   LMP  (LMP Unknown)   SpO2 100%   BMI 41.84 kg/m  Physical Exam Vitals and nursing note reviewed.  Constitutional:      General: She is not in acute distress.    Appearance: She is well-developed. She is not diaphoretic.  HENT:     Head: Normocephalic and atraumatic.     Right Ear: External ear normal.     Left Ear: External ear normal.     Nose: Nose normal.     Mouth/Throat:     Mouth: Mucous membranes  are moist.  Eyes:     General: No scleral icterus.    Conjunctiva/sclera: Conjunctivae normal.  Cardiovascular:     Rate and Rhythm: Normal rate and regular rhythm.     Heart sounds: Normal heart sounds. No murmur heard.    No friction rub. No gallop.  Pulmonary:     Effort: Pulmonary effort is normal. No respiratory distress.     Breath sounds: Normal breath sounds.  Abdominal:     General: Bowel sounds are normal. There is no distension.     Palpations: Abdomen is soft. There is no mass.     Tenderness: There is no abdominal tenderness. There is no guarding.  Musculoskeletal:     Cervical back: Normal range of motion.  Skin:    General: Skin is warm and dry.  Neurological:     Mental Status: She is alert and oriented to person, place, and time.  Psychiatric:         Behavior: Behavior normal.     ED Results / Procedures / Treatments   Labs (all labs ordered are listed, but only abnormal results are displayed) Labs Reviewed  COMPREHENSIVE METABOLIC PANEL - Abnormal; Notable for the following components:      Result Value   CO2 21 (*)    Glucose, Bld 101 (*)    Calcium 8.5 (*)    All other components within normal limits  CBC - Abnormal; Notable for the following components:   WBC 11.0 (*)    All other components within normal limits  URINALYSIS, ROUTINE W REFLEX MICROSCOPIC - Abnormal; Notable for the following components:   APPearance HAZY (*)    All other components within normal limits  LIPASE, BLOOD  HCG, SERUM, QUALITATIVE    EKG None  Radiology No results found.  Procedures Procedures    Medications Ordered in ED Medications  ondansetron (ZOFRAN-ODT) disintegrating tablet 4 mg (4 mg Oral Given 05/17/23 1810)    ED Course/ Medical Decision Making/ A&P                                 Medical Decision Making Amount and/or Complexity of Data Reviewed Labs: ordered.  Risk Prescription drug management.   Patient here with nausea vomiting and diarrhea. The differential diagnosis of diarrhea includes but is not limited to Viral- norovirus/rotavirus; Bacterial-Campylobacter,Shigella, Salmonella, Escherichia coli, E. coli 0157:H7, Yersinia enterocolitica, Vibrio cholerae, Clostridium difficile. Parasitic- Giardia lamblia, Cryptosporidium,Entamoeba histolytica,Cyclospora, Microsporidium. Toxin- Staphylococcus aureus, Bacillus cereus. Noninfectious causes include GI Bleed, Appendicitis, Mesenteric Ischemia, Diverticulitis, Adrenal Crisis, Thyroid Storm, Toxicologic exposures, Antibiotic or drug-associated, inflammatory bowel disease.  Labs reviewed show lipase within normal limits, CMP with mildly elevated blood glucose of insignificant value, CBC with white count of 11,000 in the setting of acute illness.  Urine is negative for  infection.  Negative hCG.  Patient has a benign abdominal exam.  She was given Zofran with some improvement in her nausea.  Suspect viral nausea vomiting and diarrhea. Patient is not actively vomiting at this time.  She is notably hypertensive however she is also very upset and tearful at her wait time and feeling that she was not attended to.         Final Clinical Impression(s) / ED Diagnoses Final diagnoses:  Nausea vomiting and diarrhea    Rx / DC Orders ED Discharge Orders     None         Arthor Captain, PA-C  05/17/23 1842    Rolan Bucco, MD 05/17/23 2227

## 2023-05-17 NOTE — ED Triage Notes (Signed)
 Pt arrived via POV. C/o N/V/D that began last night. Known sick contacts

## 2023-05-27 ENCOUNTER — Encounter: Payer: Self-pay | Admitting: Family Medicine

## 2023-05-27 ENCOUNTER — Ambulatory Visit: Admitting: Family Medicine

## 2023-05-27 VITALS — BP 118/80 | HR 81 | Ht 71.0 in | Wt 336.0 lb

## 2023-05-27 DIAGNOSIS — I428 Other cardiomyopathies: Secondary | ICD-10-CM

## 2023-05-27 DIAGNOSIS — Z13228 Encounter for screening for other metabolic disorders: Secondary | ICD-10-CM

## 2023-05-27 DIAGNOSIS — Z6841 Body Mass Index (BMI) 40.0 and over, adult: Secondary | ICD-10-CM

## 2023-05-27 LAB — POCT GLYCOSYLATED HEMOGLOBIN (HGB A1C): Hemoglobin A1C: 5.3 % (ref 4.0–5.6)

## 2023-05-27 MED ORDER — METOPROLOL SUCCINATE ER 25 MG PO TB24: 25.0000 mg | ORAL_TABLET | Freq: Every day | ORAL | 0 refills | Status: DC

## 2023-05-27 MED ORDER — WEGOVY 0.25 MG/0.5ML ~~LOC~~ SOAJ
0.2500 mg | SUBCUTANEOUS | 1 refills | Status: DC
Start: 2023-05-27 — End: 2023-07-16

## 2023-05-27 NOTE — Patient Instructions (Signed)
 It was great to see you! Thank you for allowing me to participate in your care!  Our plans for today:  - Please review the Mediterranean diet, I have attached. - Please try to exercise at least per week of moderate intensity exercise.   Please arrive 15 minutes PRIOR to your next scheduled appointment time! If you do not, this affects OTHER patients' care.  Take care and seek immediate care sooner if you develop any concerns.   Celine Mans, MD, PGY-2 [facility] 1:59 PM 05/27/2023  [facility]

## 2023-05-27 NOTE — Assessment & Plan Note (Signed)
 Refilled Metoprolol per patient request. Has upcoming appointment with cardiology.

## 2023-05-27 NOTE — Assessment & Plan Note (Signed)
 Will switch to Mediterranean diet.  Instructed to avoid calorically dense processed foods.  Handout provided.  Suggest goal of 150 minutes of moderate exercise per week through her walking at the park at least 3-4 times per week.  Handout provided.  Will trial initial dose of Ozempic given comorbid heart failure with reduced ejection fraction.

## 2023-05-27 NOTE — Progress Notes (Signed)
    SUBJECTIVE:   CHIEF COMPLAINT / HPI: physical, T2DM  Has tried multiple diets. Has continue to gain weight. Has had to go to urgent care for back pain she believes is related to weight. Tried keto diet for 6 weeks ongoing now. Tried a liquid diet for 3 weeks without weight loss. Has increased walking.  Once a week goes to the park.  Will go with friend who also had a baby recently. She is frustrated that she is continue to gain weight despite these changes. Presenting today because she is seeking other options and committed to further changes regarding her diet and exercise.  PERTINENT  PMH / PSH: Non-ischemic Cardiomyopathy, Mitral Regurgitation, HFrEF, Morbid Obesity  OBJECTIVE:   BP 118/80   Pulse 81   Ht 5\' 11"  (1.803 m)   Wt (!) 336 lb (152.4 kg)   LMP  (LMP Unknown)   SpO2 98%   BMI 46.86 kg/m   General: NAD, well appearing Neuro: A&O Respiratory: normal WOB on RA Extremities: Moving all 4 extremities equally   ASSESSMENT/PLAN:   Assessment & Plan BMI 45.0-49.9, adult (HCC) Will switch to Mediterranean diet.  Instructed to avoid calorically dense processed foods.  Handout provided.  Suggest goal of 150 minutes of moderate exercise per week through her walking at the park at least 3-4 times per week.  Handout provided.  Will trial initial dose of Ozempic given comorbid heart failure with reduced ejection fraction. Screening for metabolic disorder Repeat A1c, Lipid panel today. Cardiomyopathy, nonischemic (HCC) Refilled Metoprolol per patient request. Has upcoming appointment with cardiology.  Return in about 2 months (around 07/27/2023) for Weight Loss F/u.  Celine Mans, MD [facility]

## 2023-05-28 ENCOUNTER — Encounter: Payer: Self-pay | Admitting: Family Medicine

## 2023-05-28 LAB — LIPID PANEL
Chol/HDL Ratio: 3.6 ratio (ref 0.0–4.4)
Cholesterol, Total: 142 mg/dL (ref 100–199)
HDL: 40 mg/dL (ref >39)
LDL Chol Calc (NIH): 91 mg/dL (ref 0–99)
Triglycerides: 51 mg/dL (ref 0–149)
VLDL Cholesterol Cal: 11 mg/dL (ref 5–40)

## 2023-05-31 ENCOUNTER — Telehealth: Payer: Self-pay

## 2023-05-31 ENCOUNTER — Other Ambulatory Visit (HOSPITAL_COMMUNITY): Payer: Self-pay

## 2023-05-31 NOTE — Telephone Encounter (Signed)
 Pharmacy Patient Advocate Encounter   Received notification from CoverMyMeds that prior authorization for [facility] 0.25MG  is required/requested.   Insurance verification completed.   The patient is insured through [facility] .   PA required; PA submitted to above mentioned insurance via CoverMyMeds Key/confirmation #/EOC [facility]. Status is pending

## 2023-06-01 NOTE — Telephone Encounter (Signed)
 Pharmacy Patient Advocate Encounter  Received notification from [facility] that Prior Authorization for [facility] 0.25MG  has been APPROVED from 05/31/23 to 11/27/23   PA #/Case ID/Reference #: [id number]

## 2023-06-15 ENCOUNTER — Ambulatory Visit: Payer: Self-pay | Attending: Cardiology | Admitting: Cardiology

## 2023-07-05 ENCOUNTER — Telehealth: Payer: Self-pay | Admitting: Student

## 2023-07-05 NOTE — Telephone Encounter (Signed)
 Patient came in stating that she needs a letter from her doctor stating that she has an emotional support animal. She needs this for her housing.

## 2023-07-06 NOTE — Telephone Encounter (Signed)
 Called patient and appointment has been scheduled. Alain Howard CMA

## 2023-07-16 ENCOUNTER — Ambulatory Visit: Admitting: Student

## 2023-07-16 VITALS — BP 128/74 | HR 84 | Wt 333.0 lb

## 2023-07-16 DIAGNOSIS — B009 Herpesviral infection, unspecified: Secondary | ICD-10-CM

## 2023-07-16 DIAGNOSIS — F39 Unspecified mood [affective] disorder: Secondary | ICD-10-CM | POA: Diagnosis not present

## 2023-07-16 MED ORDER — VALACYCLOVIR HCL 1 G PO TABS: 1000.0000 mg | ORAL_TABLET | Freq: Every day | ORAL | 1 refills | Status: AC

## 2023-07-16 MED ORDER — ESCITALOPRAM OXALATE 5 MG PO TABS
5.0000 mg | ORAL_TABLET | Freq: Every day | ORAL | 0 refills | Status: DC
Start: 2023-07-16 — End: 2023-10-18

## 2023-07-16 NOTE — Assessment & Plan Note (Signed)
 Current flare-up with vaginal lesions. Advised Valtrex  at symptom onset for efficacy. - Refill Valtrex  1 gram daily for five days. - Advise Valtrex  at symptom onset. - Discuss suppressive therapy if frequent episodes occur.

## 2023-07-16 NOTE — Patient Instructions (Addendum)
 It was great to see you! Thank you for allowing me to participate in your care!  Our plans for today:  - Can use psychologytoday.com to find a therapist  - Lexapro for anxiety sent to pharmacy, take once daily, may take 4 to 6 weeks to have effect.  Return in about a month for follow-up - Letter provided to have emotional support animal - Valtrex  refilled   Take care and seek immediate care sooner if you develop any concerns.   Dr. Glenn Lange, DO [facility]   Therapy and Counseling Resources Most providers on this list will take Medicaid. Patients with commercial insurance or Medicare should contact their insurance company to get a list of in network providers.  [facility] (spanish speaking therapist available)(habla espanol)(take medicare and medicaid)  [address], [place], [place] [address], [place]  [email or web address] [phone]  [facility]iatry and Counseling [address] [address] [place], [place] [address] [phone]  [facility]   [address] Suite [place], [place] [address]      [phone]  [facility] (spanish available) [address]  [place], [place] [address] [phone]  [facility] (take [facility] and medicare) [address]  [place], [place] [address]       [phone]     [facility] (virtual only) [phone]  [facility] [address] [place], [place] [phone]  [facility]:  [address] [place] [phone]  [facility]:  [address] [place] [phone]  [facility] (under & uninsured) [address]   [place] [phone]    [email or web address]    [facility] [address]  [place]    [phone]  [facility] -[address]     Phone:  [phone]      [facility]-  [address]  [phone]   [facility] [address]      [place], [place] [phone]  [facility]: [address] [place], [place], [place]  [phone]   [facility] (Spanish therapist) [email or web address]  [address] [place]  [address]   [place] [address]    [phone]    [facility]   [address],  [place], [place]  [phone]   [facility]  [address] [place]  [phone]  [facility]  [address] [place], [place]        [phone]  Open Access/Walk In Clinic under & uninsured  [facility]  [address] [place], [place] [phone] Crisis [phone]  [facility] [address],  (Spanish)   [address] , [place]: [phone]) 8:30 - 12; 1 - 2:30  [facility] [employer],  [address] [place], [place]    [phone]):8:30 - 12; 2 - 3PM  [facility] [employer],  [address]  [place]; [phone]):   Mon - Fri 8 AM - 5 PM  [facility] [address] [place]  MWF 12:30 to 3:00 or call to schedule an appointment  [phone]  Specific Provider options Psychology Today  [email or web address]/us  click on find a therapist  enter your zip code left side and select or tailor a therapist for your specific need.   [facility] Provider Directory  [email or web address]  (Medicaid)   Follow all drop down to find a provider  Social Support program [facility] [phone] or PhotoSolver.pl [address], [place], [place] Recovery support and educational   24- Hour Availability:   [facility]  [address] [place], [place] [phone] Crisis [phone]  [facility] [phone]  [facility]   [phone]   [facility]  [phone] (after hours)  [facility]/Mobile Crisis   [phone]  [place]  National Suicide Hotline  7195031684 Derrel Flies)  Call 911 or go to emergency room  [facility]  [phone]);  [place] and [employer]  [phone]); [place], [place], [place], [place], [place], [place], [place]
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
## 2023-07-16 NOTE — Assessment & Plan Note (Addendum)
 Experiences stress from recent move, weight concerns, health issues and financial issues. Symptoms suggest anxiety attacks.  Historically, her dog has been a good emotional support for her.  Discussed Lexapro benefits and risks.  She denies any manic symptoms. - Prescribe Lexapro 5 mg daily, adjust as needed. - Recommend therapy, provided resources - Follow-up in one month to assess medication side effects and possibly increased dose -Letter provided for emotional support animal

## 2023-07-30 ENCOUNTER — Ambulatory Visit: Admitting: Student

## 2023-07-30 NOTE — Progress Notes (Deleted)

## 2023-08-01 ENCOUNTER — Ambulatory Visit (HOSPITAL_COMMUNITY)

## 2023-08-02 ENCOUNTER — Ambulatory Visit (HOSPITAL_COMMUNITY)

## 2023-08-03 IMAGING — US US MFM OB FOLLOW-UP
1 series · 13 of 28 positions shown · non-contrast
Comparison: none

[Series 1: us mfm ob follow-up · 13 of 88 slices shown]
[im 4/88]
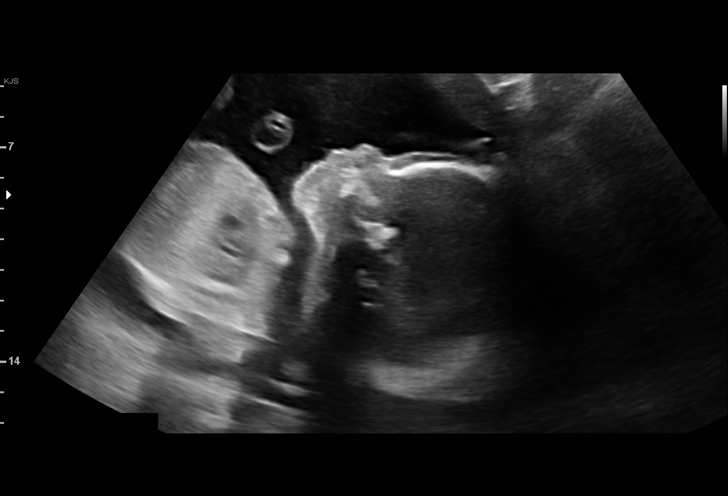
[im 10/88]
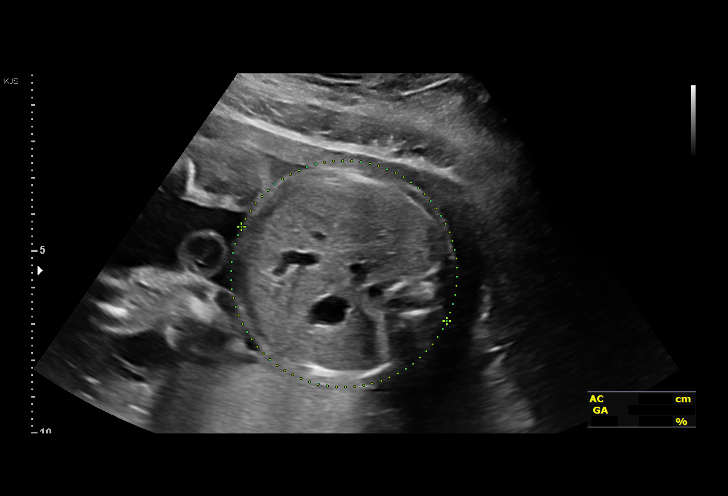
[im 17/88]
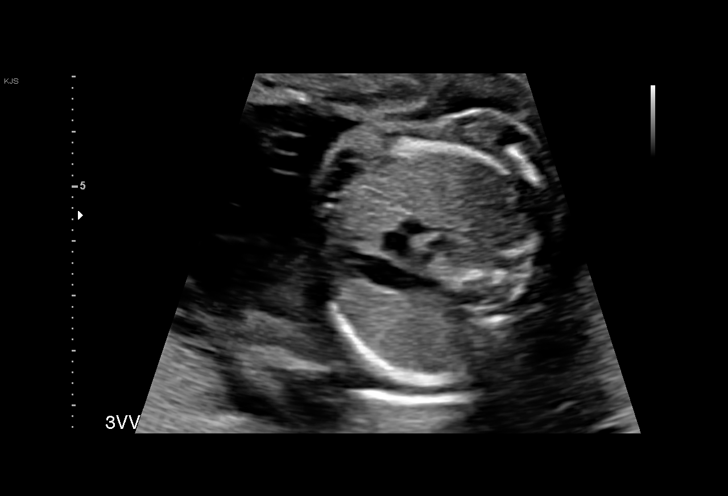
[im 23/88]
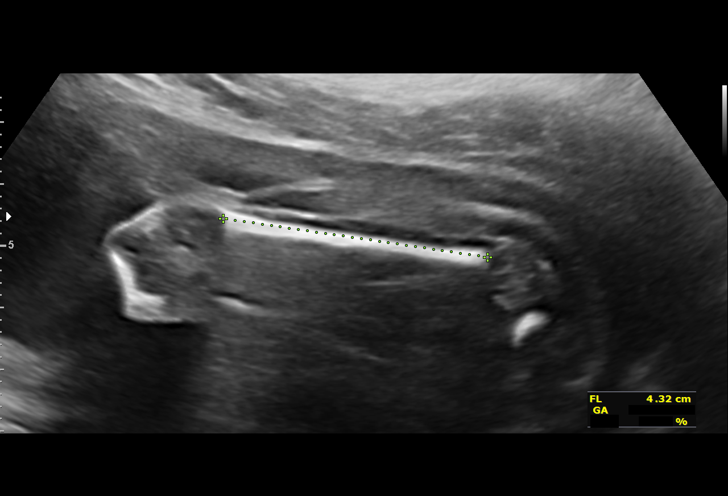
[im 30/88]
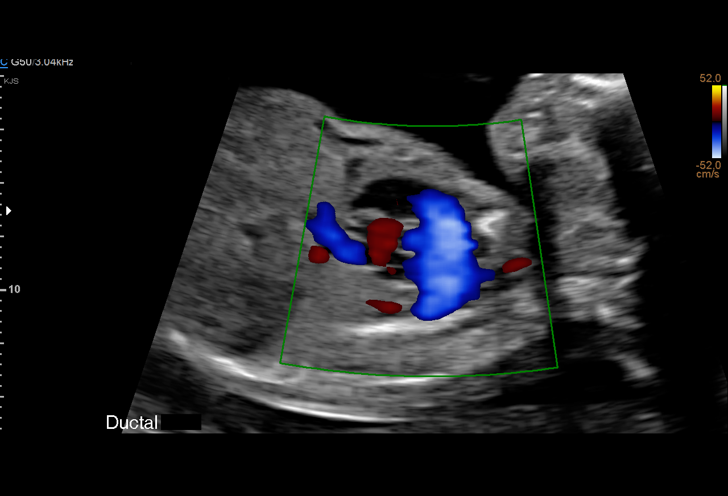
[im 36/88]
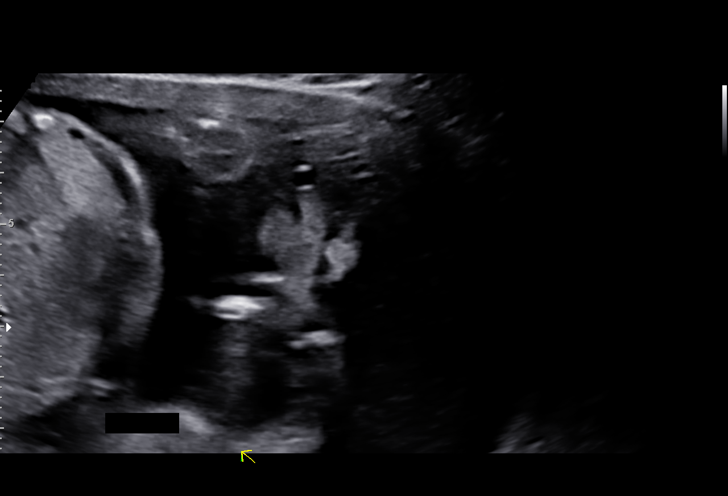
[im 46/88]
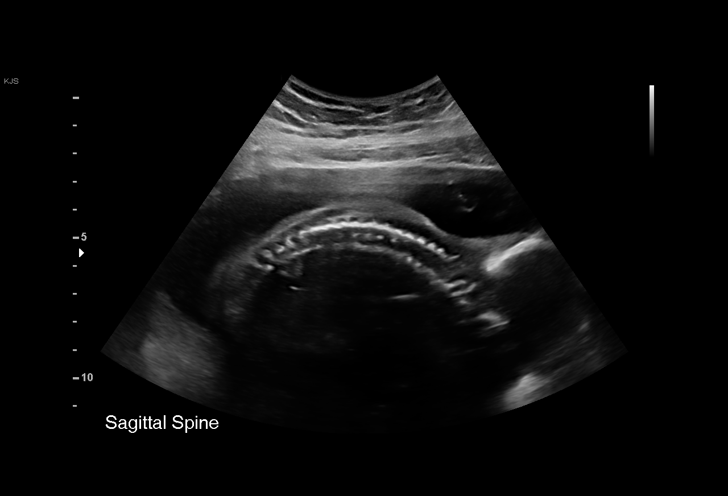
[im 52/88]
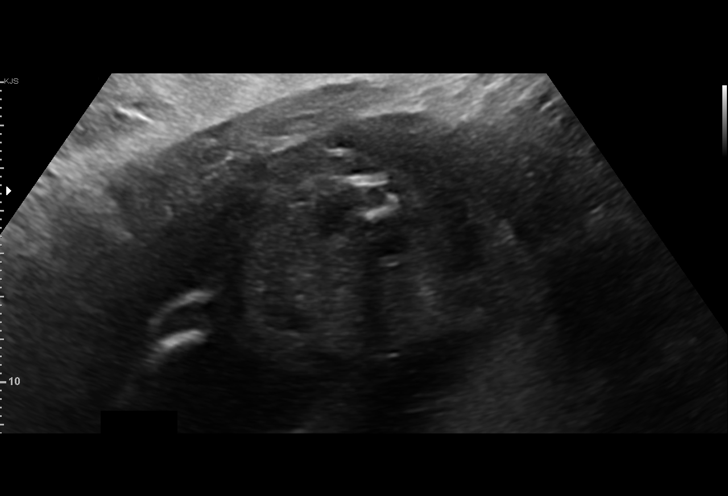
[im 59/88]
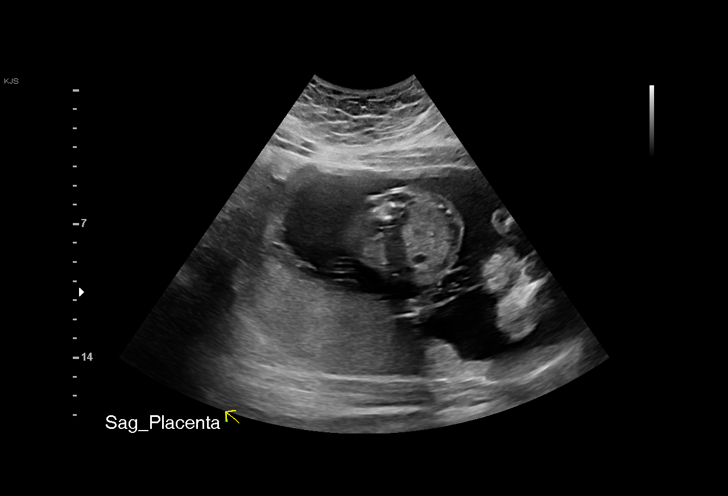
[im 65/88]
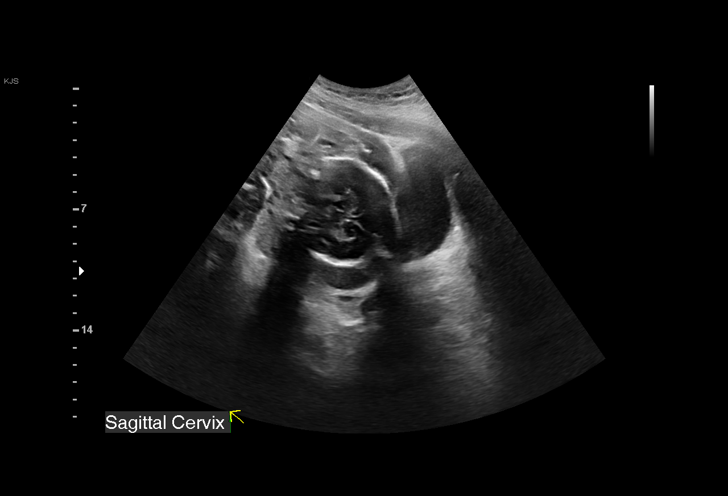
[im 71/88]
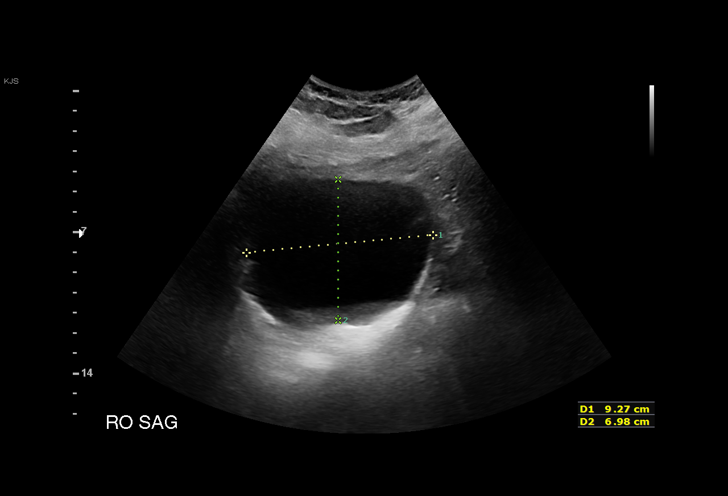
[im 78/88]
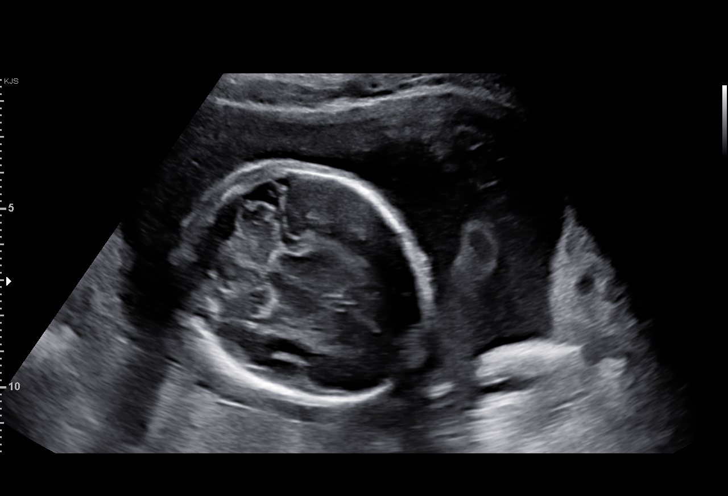
[im 84/88]
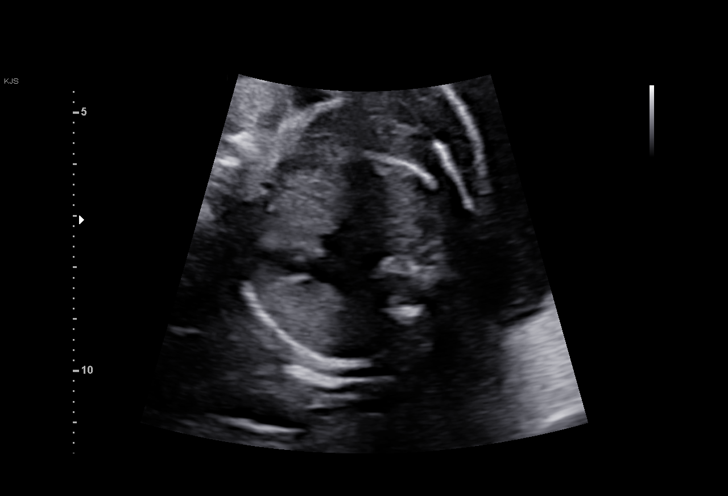

[13 of 28 positions shown; findings below may reference images not displayed]

Indications

 Obesity complicating pregnancy, second
 trimester (BMI 39)
 Heart disease in mother affecting pregnancy
 in second trimester
 Antenatal follow-up for nonvisualized fetal
 anatomy
 Ovarian cyst (simple)
 24 weeks gestation of pregnancy
 LR NIPS, Mat 21 Neg
Fetal Evaluation

 Num Of Fetuses:         1
 Fetal Heart Rate(bpm):  143
 Cardiac Activity:       Observed
 Presentation:           Cephalic
 Placenta:               Posterior
 P. Cord Insertion:      Visualized, central

 Amniotic Fluid
 AFI FV:      Within normal limits

                             Largest Pocket(cm)
                             5
Biometry
 BPD:      60.2  mm     G. Age:  24w 4d         52  %    CI:        71.23   %    70 - 86
                                                         FL/HC:      18.8   %    18.7 -
 HC:      227.2  mm     G. Age:  24w 5d         49  %    HC/AC:      1.18        1.05 -
 AC:      191.9  mm     G. Age:  23w 6d         29  %    FL/BPD:     70.9   %    71 - 87
 FL:       42.7  mm     G. Age:  24w 0d         27  %    FL/AC:      22.3   %    20 - 24
 HUM:        39  mm     G. Age:  23w 6d         32  %
 LV:          6  mm

 Est. FW:     654  gm      1 lb 7 oz     30  %
OB History

 Blood Type:   B-
 Gravidity:    1
Gestational Age

 LMP:           24w 2d        Date:  07/29/20                 EDD:   05/05/21
 U/S Today:     24w 2d                                        EDD:   05/05/21
 Best:          24w 2d     Det. By:  LMP  (07/29/20)          EDD:   05/05/21
Anatomy

 Cranium:               Appears normal         LVOT:                   Previously seen
 Cavum:                 Appears normal         Aortic Arch:            Appears normal
 Ventricles:            Appears normal         Ductal Arch:            Appears normal
 Choroid Plexus:        Previously seen        Diaphragm:              Appears normal
 Cerebellum:            Previously seen        Stomach:                Appears normal, left
                                                                       sided
 Posterior Fossa:       Previously seen        Abdomen:                Appears normal
 Nuchal Fold:           Not applicable (>20    Abdominal Wall:         Previously seen
                        wks GA)
 Face:                  Orbits and profile     Cord Vessels:           Previously seen
                        previously seen
 Lips:                  Appears normal         Kidneys:                Appear normal
 Palate:                Appears normal         Bladder:                Appears normal
 Thoracic:              Previously seen        Spine:                  Appears normal
 Heart:                 Previously seen        Upper Extremities:      Previously seen
 RVOT:                  Appears normal         Lower Extremities:      Previously seen

 Other:  Female gender previously seen. Lenses and nasal bone previously
         visualized. Technically difficult due to maternal habitus and fetal
         position.
Cervix Uterus Adnexa

 Cervix
 Not visualized (advanced GA >47wks)

 Uterus
 No abnormality visualized.

 Right Ovary
 Simple cyst, measuring 9.3 x 9.2 x 7.0 cm
 Left Ovary
 Size(cm)     3.74   x    2     x  2.35      Vol(ml):
 Within normal limits.

 Cul De Sac
 No free fluid seen.

 Adnexa
 No adnexal mass visualized.
Comments

 This patient was seen for a follow up growth scan due to due
 to maternal cardiac disease.  Her echocardiogram showed a
 low ejection fraction of between 40 to 45% and global
 hypokinesis.  She was seen by cardiology who started her on
 Toprol XL 12.5 mg daily.  She denies any problems since her
 last exam and reports that she is feeling well.
 She was informed that the fetal growth and amniotic fluid
 level appears appropriate for her gestational age.
 The views of the fetal anatomy continue to be limited today
 due to the fetal position and maternal body habitus.  There
 were no obvious fetal anomalies noted today.
 The 9 cm simple appearing right adnexal cyst continues to be
 noted today.
 Due to her history of cardiac disease, we will continue to
 follow her with monthly growth scans.
 A follow up exam was scheduled in 4 weeks.

## 2023-08-10 ENCOUNTER — Encounter: Payer: Self-pay | Admitting: *Deleted

## 2023-08-18 ENCOUNTER — Ambulatory Visit (INDEPENDENT_AMBULATORY_CARE_PROVIDER_SITE_OTHER)

## 2023-08-18 ENCOUNTER — Ambulatory Visit (HOSPITAL_COMMUNITY)
Admission: EM | Admit: 2023-08-18 | Discharge: 2023-08-18 | Disposition: A | Discharge status: Home / Self Care | Attending: Physician Assistant | Admitting: Physician Assistant

## 2023-08-18 ENCOUNTER — Ambulatory Visit

## 2023-08-18 ENCOUNTER — Ambulatory Visit (HOSPITAL_COMMUNITY): Payer: Self-pay | Admitting: Physician Assistant

## 2023-08-18 ENCOUNTER — Encounter (HOSPITAL_COMMUNITY): Payer: Self-pay

## 2023-08-18 DIAGNOSIS — G8929 Other chronic pain: Secondary | ICD-10-CM | POA: Insufficient documentation

## 2023-08-18 DIAGNOSIS — M545 Low back pain, unspecified: Secondary | ICD-10-CM

## 2023-08-18 DIAGNOSIS — R8290 Unspecified abnormal findings in urine: Secondary | ICD-10-CM | POA: Diagnosis not present

## 2023-08-18 LAB — POCT URINALYSIS DIP (MANUAL ENTRY)
Bilirubin, UA: NEGATIVE
Blood, UA: NEGATIVE
Glucose, UA: NEGATIVE mg/dL
Nitrite, UA: NEGATIVE
Spec Grav, UA: 1.025 (ref 1.010–1.025)
Urobilinogen, UA: 1 U/dL
pH, UA: 6.5 (ref 5.0–8.0)

## 2023-08-18 LAB — POCT URINE PREGNANCY: Preg Test, Ur: NEGATIVE

## 2023-08-18 MED ORDER — METHOCARBAMOL 500 MG PO TABS: 500.0000 mg | ORAL_TABLET | Freq: Two times a day (BID) | ORAL | 0 refills | Status: DC

## 2023-08-18 MED ORDER — KETOROLAC TROMETHAMINE 30 MG/ML IJ SOLN
INTRAMUSCULAR | Status: AC
  Filled 2023-08-18: qty 1

## 2023-08-18 MED ORDER — KETOROLAC TROMETHAMINE 30 MG/ML IJ SOLN
30.0000 mg | Freq: Once | INTRAMUSCULAR | Status: AC
  Administered 2023-08-18: 30 mg via INTRAMUSCULAR

## 2023-08-18 MED ORDER — DEXAMETHASONE SODIUM PHOSPHATE 10 MG/ML IJ SOLN
INTRAMUSCULAR | Status: AC
  Filled 2023-08-18: qty 1

## 2023-08-18 MED ORDER — DEXAMETHASONE SODIUM PHOSPHATE 10 MG/ML IJ SOLN
10.0000 mg | Freq: Once | INTRAMUSCULAR | Status: AC
Start: 2023-08-18 — End: 2023-08-18
  Administered 2023-08-18: 10 mg via INTRAMUSCULAR

## 2023-08-18 NOTE — Discharge Instructions (Signed)
 We gave an injection of steroids and ketorolac  today.  Do not take any NSAIDs for the next 24 hours including aspirin , ibuprofen /Advil , naproxen/Aleve.  You can use Tylenol /acetaminophen  as needed.  Take Robaxin up to twice a day.  This will make you sleepy so do not drive or drink alcohol with taking it.  Use heat and gentle stretch to help with your symptoms.  Follow-up with sports medicine as soon as possible.  Call them to schedule an appointment.  If anything worsens you have increasing pain, weakness in your legs, numbness or tingling, going to the bathroom yourself without noticing it, fever you need to be seen immediately.

## 2023-08-18 NOTE — ED Triage Notes (Addendum)
 Pt states back pain to her whole back on and off for the past year.  States it has gotten worse for the  past week.  States she has been taking OTC pain relievers with no relief. Pt also states she was vomiting today.

## 2023-08-18 NOTE — ED Provider Notes (Signed)
 [facility]    CSN: [id number] Arrival date & time: 08/18/23  1610      History   Chief Complaint Chief Complaint  Patient presents with   Back Pain    HPI Jamie Gates is a 23 y.o. female.   Patient presents today with a prolonged history of lower back pain.  She reports that symptoms began after she had an epidural with her children several years ago.  She denies any previous injury, change in activity, recent fall.  She reports that pain is primarily in her lower back but radiates towards her thoracic back, rated 8 on a 0-10 pain scale, described as intense aching, no aggravating or alleviating factors identified.  She has been taking ibuprofen , Tylenol , using topical medications without improvement of symptoms.  She has been seen several times in the past and been prescribed several medications that provide only temporary relief of symptoms.  She has never seen orthopedics or had any imaging of her back.  She denies any personal history of malignancy.  Denies previous spinal surgery.  Denies any bowel/bladder incontinence, lower extremity weakness, saddle anesthesia.  She does report that she has an episode of vomiting earlier but she attributed this to the severity of pain.  She denies any urinary symptoms but is open to having her urine checked as she has previously had urinary tract infections with her primary symptom being the lower back pain.    Past Medical History:  Diagnosis Date   Abnormal antibody titer (anti-Lewis antibodies) 09/30/2020   Adjustment disorder with depressed mood 09/30/2020   Cardiomegaly 12/18/2012   still working with cardiology-esp now with pregnancy   Cardiomyopathy, nonischemic (HCC) 02/05/2021   Chlamydia 09/23/2018   Treated 09/23/2018   Gonorrhea 09/23/2018   Treated 09/23/2018   Heart failure with reduced ejection fraction (HCC) 01/05/2022   Her echo in 03/2021 showed low normal EF.  Recent echo back down mildy depressed 40-45%  11/25/2021.    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   Migraines    Mitral regurgitation    Morbid obesity (HCC)    Ovarian cyst    Pregnancy induced hypertension     Patient Active Problem List   Diagnosis Date Noted   Mood disorder (HCC) 07/16/2023   Encounter for initial prescription of implantable subdermal contraceptive 05/29/2022   Nexplanon  insertion 05/29/2022   GBS (group B Streptococcus carrier), +RV culture, currently pregnant 05/14/2022   Red Chart Patient 04/21/2022   BMI 45.0-49.9, adult (HCC) 03/23/2022   Anemia of pregnancy 03/10/2022   Atypical squamous cell changes of undetermined significance (ASCUS) with negative high risk human papilloma virus (HPV) pap 01/05/2022 01/08/2022   Heart failure with reduced ejection fraction (HCC) 01/05/2022   Supervision of high risk pregnancy, antepartum 12/10/2021   History of maternal cardiomyopathy, currently pregnant 11/06/2021   Vaginal delivery 03/14/2021   History of IUFD at 32 weeks 03/11/2021   Mitral regurgitation of mother during pregnancy 03/11/2021   Cardiomyopathy, nonischemic (HCC) 02/05/2021   Rh negative state in antepartum period 01/07/2021   Adjustment disorder with depressed mood 09/30/2020   Abnormal antibody titer (anti-Lewis antibodies) 09/30/2020   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   Right ovarian cyst 04/13/2018   Cardiomegaly 12/18/2012   Maternal morbid obesity, antepartum (HCC) 12/04/2010    Past Surgical History:  Procedure Laterality Date   NEXPLANON  TRAY  05/29/2022   TYMPANOSTOMY TUBE PLACEMENT      OB History     Gravida  2   Para  2  Term  1   Preterm  1   AB  0   Living  1      SAB  0   IAB  0   Ectopic  0   Multiple  0   Live Births  1            Home Medications    Prior to Admission medications   Medication Sig Start Date End Date Taking? Authorizing Provider  methocarbamol (ROBAXIN) 500 MG tablet Take 1 tablet (500 mg total) by mouth 2 (two) times daily. 08/18/23  Yes  Benett Swoyer K, PA-C  amLODipine  (NORVASC ) 2.5 MG tablet Take 1 tablet (2.5 mg total) by mouth daily. Patient not taking: Reported on 07/16/2023 03/26/23   Tobb, Kardie, DO  escitalopram  (LEXAPRO ) 5 MG tablet Take 1 tablet (5 mg total) by mouth daily. 07/16/23   Glenn Lange, DO  etonogestrel  (NEXPLANON ) 68 MG IMPL implant 1 each by Subdermal route once.    [provider]  metoprolol  succinate (TOPROL  XL) 25 MG 24 hr tablet Take 1 tablet (25 mg total) by mouth daily. 05/27/23   Ivin Marrow, MD    Family History Family History  Problem Relation Age of Onset   Raynaud syndrome Mother    Diabetes Father    Hyperlipidemia Father    Hypertension Father    Asthma Neg Hx    Stroke Neg Hx     Social History Social History   Tobacco Use   Smoking status: Former    Current packs/day: 0.00    Types: Cigars, Cigarettes    Quit date: 09/03/2020    Years since quitting: 2.9    Passive exposure: Past   Smokeless tobacco: Never  Vaping Use   Vaping status: Never Used  Substance Use Topics   Alcohol use: No   Drug use: Not Currently    Types: Marijuana     Allergies   Patient has no known allergies.   Review of Systems Review of Systems  Constitutional:  Positive for activity change. Negative for appetite change, fatigue and fever.  Gastrointestinal:  Positive for nausea and vomiting. Negative for abdominal pain and diarrhea.  Genitourinary:  Negative for dysuria, frequency and urgency.  Musculoskeletal:  Positive for back pain. Negative for arthralgias and myalgias.  Neurological:  Negative for weakness and numbness.     Physical Exam Triage Vital Signs ED Triage Vitals  Encounter Vitals Group     BP 08/18/23 1752 121/69     Girls Systolic BP Percentile --      Girls Diastolic BP Percentile --      Boys Systolic BP Percentile --      Boys Diastolic BP Percentile --      Pulse Rate 08/18/23 1749 68     Resp 08/18/23 1749 16     Temp 08/18/23 1749 98.1 F (36.7  C)     Temp Source 08/18/23 1749 Oral     SpO2 08/18/23 1749 98 %     Weight --      Height --      Head Circumference --      Peak Flow --      Pain Score 08/18/23 1752 8     Pain Loc --      Pain Education --      Exclude from Growth Chart --    No data found.  Updated Vital Signs BP 121/69 (BP Location: Right Arm)   Pulse 68   Temp 98.1 F (  36.7 C) (Oral)   Resp 16   SpO2 98%   Visual Acuity Right Eye Distance:   Left Eye Distance:   Bilateral Distance:    Right Eye Near:   Left Eye Near:    Bilateral Near:     Physical Exam Vitals reviewed.  Constitutional:      General: She is awake. She is not in acute distress.    Appearance: Normal appearance. She is well-developed. She is not ill-appearing.     Comments: Very pleasant female appears stated age in no acute distress sitting comfortably in exam room  HENT:     Head: Normocephalic and atraumatic.   Cardiovascular:     Rate and Rhythm: Normal rate and regular rhythm.     Heart sounds: Normal heart sounds, S1 normal and S2 normal. No murmur heard. Pulmonary:     Effort: Pulmonary effort is normal.     Breath sounds: Normal breath sounds. No wheezing, rhonchi or rales.     Comments: Clear to auscultation bilaterally Abdominal:     General: Bowel sounds are normal.     Palpations: Abdomen is soft.     Tenderness: There is no abdominal tenderness. There is no right CVA tenderness, left CVA tenderness, guarding or rebound.     Comments: Benign abdominal exam   Musculoskeletal:     Cervical back: No tenderness or bony tenderness.     Thoracic back: No tenderness.     Lumbar back: Tenderness present. No bony tenderness. Negative right straight leg raise test and negative left straight leg raise test.     Comments: Back: Tenderness palpation of bilateral lumbar paraspinal muscles.  No deformity or spasm noted.  No pain with percussion of vertebrae.  No step-off noted.  Strength 5/5 bilateral lower extremities.    Psychiatric:        Behavior: Behavior is cooperative.      UC Treatments / Results  Labs (all labs ordered are listed, but only abnormal results are displayed) Labs Reviewed  POCT URINALYSIS DIP (MANUAL ENTRY) - Abnormal; Notable for the following components:      Result Value   Clarity, UA cloudy (*)    Ketones, POC UA trace (5) (*)    Protein Ur, POC trace (*)    Leukocytes, UA Trace (*)    All other components within normal limits  URINE CULTURE  POCT URINE PREGNANCY    EKG   Radiology No results found.  Procedures Procedures (including critical care time)  Medications Ordered in UC Medications  ketorolac  (TORADOL ) 30 MG/ML injection 30 mg (30 mg Intramuscular Given 08/18/23 1836)  dexamethasone  (DECADRON ) injection 10 mg (10 mg Intramuscular Given 08/18/23 1836)    Initial Impression / Assessment and Plan / UC Course  I have reviewed the triage vital signs and the nursing notes.  Pertinent labs & imaging results that were available during my care of the patient were reviewed by me and considered in my medical decision making (see chart for details).     Patient is well-appearing, afebrile, nontoxic, nontachycardic.  She did have an episode of nausea and vomiting but attributed this to the severity of her pain.  Urine pregnancy was obtained that was negative.  UA did have trace leuks but she denies any significant urinary symptoms so we will send this for culture as she has had UTI in the past with primary symptom being lower back pain.  Will defer antibiotics until culture results are available.  I suspect musculoskeletal etiology  of back pain.  She was given Decadron  and ketorolac  in clinic with improvement of symptoms at this regimen has been helpful in the past.  We discussed that she should avoid NSAIDs for the next 24 hours but can use Tylenol  for breakthrough pain.  She was started on Robaxin up to twice a day.  We discussed that this can be sedating and she is  not to drive or drink alcohol with taking it.  X-ray was obtained that showed no acute osseous abnormality based on my primary read.  At the time discharge you were waiting for radiologist over read.  Recommend that she follow-up with sports medicine as she may benefit from physical therapy and/or more advanced imaging that we do not have access to in urgent care.  Discussed that if anything worsens or changes and she has severe pain, fever, nausea, vomiting, bowel/bladder continence, lower extremity weakness, saddle anesthesia she needs to go to the emergency room immediately.  Strict return precautions given.  Excuse note provided.  All questions answered to patient satisfaction.  Final Clinical Impressions(s) / UC Diagnoses   Final diagnoses:  Chronic bilateral low back pain without sciatica  Abnormal urinalysis     Discharge Instructions      We gave an injection of steroids and ketorolac  today.  Do not take any NSAIDs for the next 24 hours including aspirin , ibuprofen /Advil , naproxen/Aleve.  You can use Tylenol /acetaminophen  as needed.  Take Robaxin up to twice a day.  This will make you sleepy so do not drive or drink alcohol with taking it.  Use heat and gentle stretch to help with your symptoms.  Follow-up with sports medicine as soon as possible.  Call them to schedule an appointment.  If anything worsens you have increasing pain, weakness in your legs, numbness or tingling, going to the bathroom yourself without noticing it, fever you need to be seen immediately.     ED Prescriptions     Medication Sig Dispense Auth. Provider   methocarbamol (ROBAXIN) 500 MG tablet Take 1 tablet (500 mg total) by mouth 2 (two) times daily. 20 tablet Stephone Gum K, PA-C      PDMP not reviewed this encounter.   Budd Cargo, PA-C 08/18/23 1853

## 2023-08-18 NOTE — ED Triage Notes (Signed)
 TC call to Pt ,who is waiting in car . No answer to call.

## 2023-08-19 LAB — URINE CULTURE

## 2023-08-22 DIAGNOSIS — R509 Fever, unspecified: Secondary | ICD-10-CM | POA: Diagnosis not present

## 2023-08-22 DIAGNOSIS — R519 Headache, unspecified: Secondary | ICD-10-CM | POA: Diagnosis not present

## 2023-08-22 DIAGNOSIS — Z041 Encounter for examination and observation following transport accident: Secondary | ICD-10-CM | POA: Diagnosis not present

## 2023-08-22 DIAGNOSIS — R162 Hepatomegaly with splenomegaly, not elsewhere classified: Secondary | ICD-10-CM | POA: Diagnosis not present

## 2023-08-22 DIAGNOSIS — K5730 Diverticulosis of large intestine without perforation or abscess without bleeding: Secondary | ICD-10-CM | POA: Diagnosis not present

## 2023-08-22 DIAGNOSIS — M542 Cervicalgia: Secondary | ICD-10-CM | POA: Diagnosis not present

## 2023-08-22 DIAGNOSIS — R000 Tachycardia, unspecified: Secondary | ICD-10-CM | POA: Diagnosis not present

## 2023-08-22 DIAGNOSIS — T07XXXA Unspecified multiple injuries, initial encounter: Secondary | ICD-10-CM | POA: Diagnosis not present

## 2023-08-22 DIAGNOSIS — R109 Unspecified abdominal pain: Secondary | ICD-10-CM | POA: Diagnosis not present
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
## 2023-08-23 DIAGNOSIS — R079 Chest pain, unspecified: Secondary | ICD-10-CM | POA: Diagnosis not present

## 2023-09-30 IMAGING — US US MFM OB LIMITED
1 series · 14 of 28 positions shown · non-contrast
Comparison: none

[Series 1: us mfm ob limited · 14 of 28 slices shown]
[im 2/28]
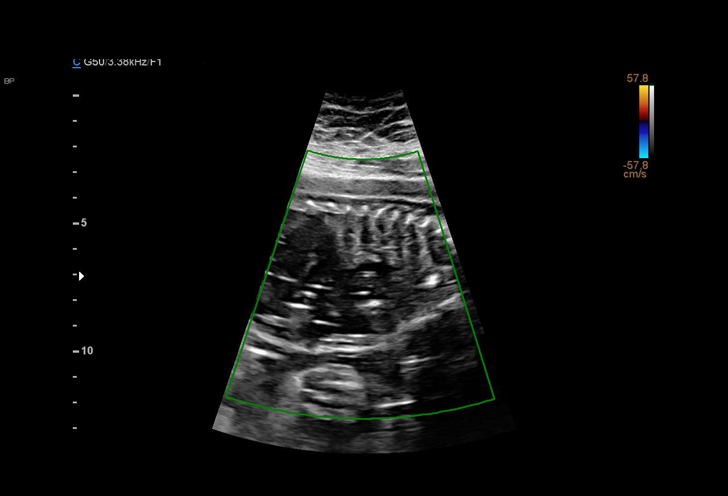
[im 4/28]
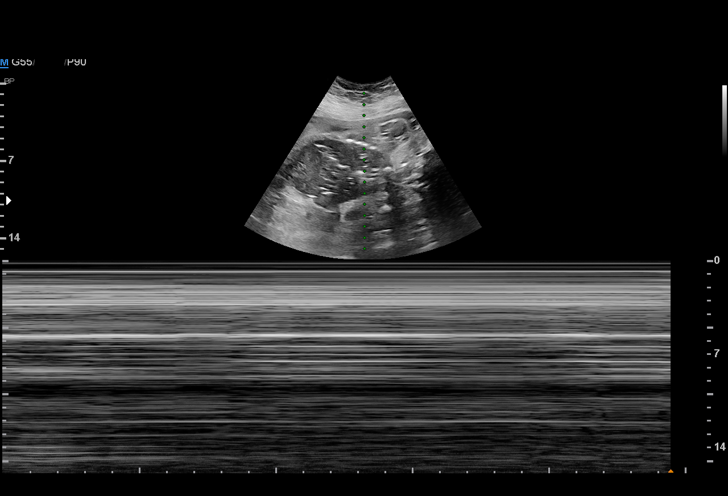
[im 6/28]
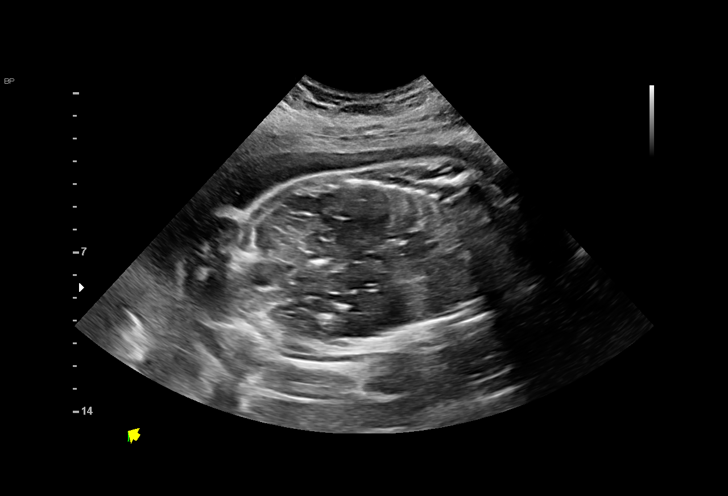
[im 8/28]
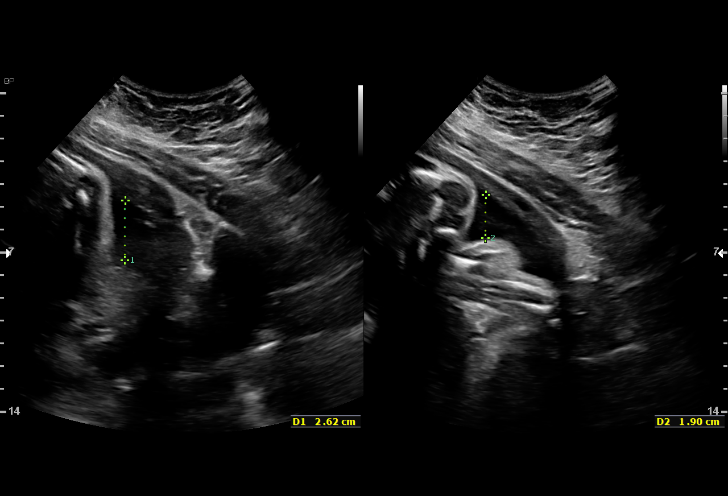
[im 10/28]
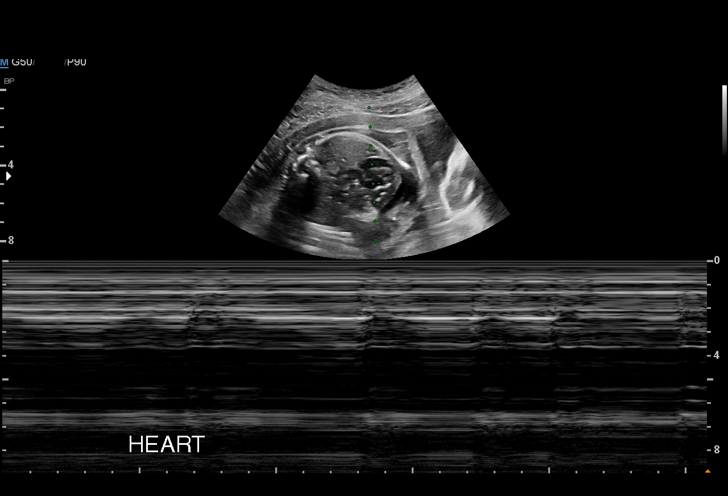
[im 12/28]
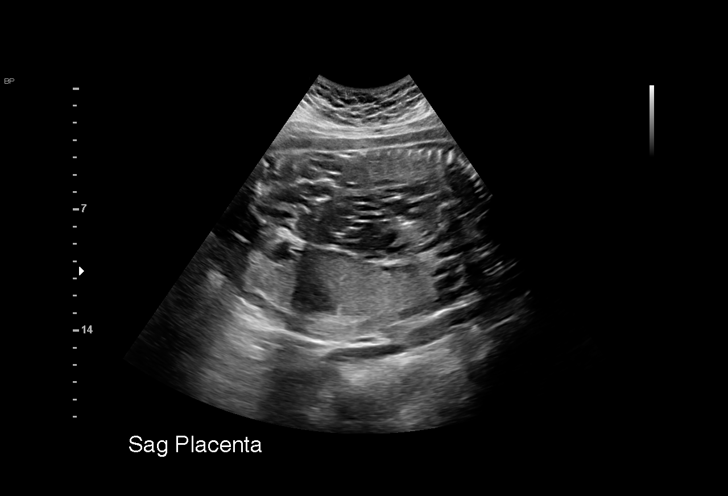
[im 14/28]
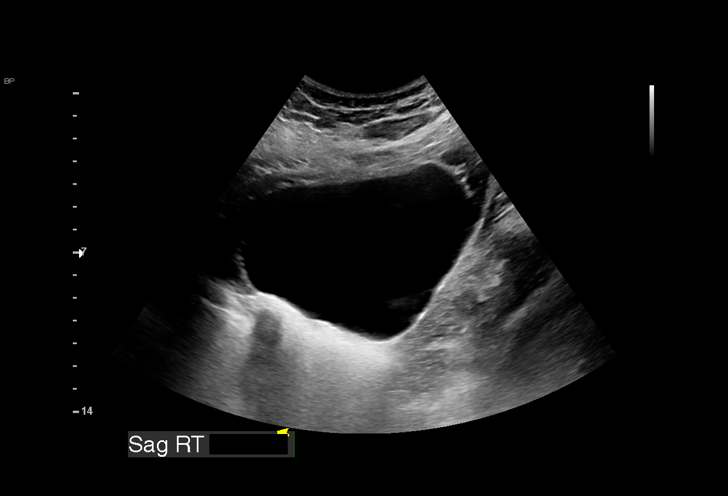
[im 16/28]
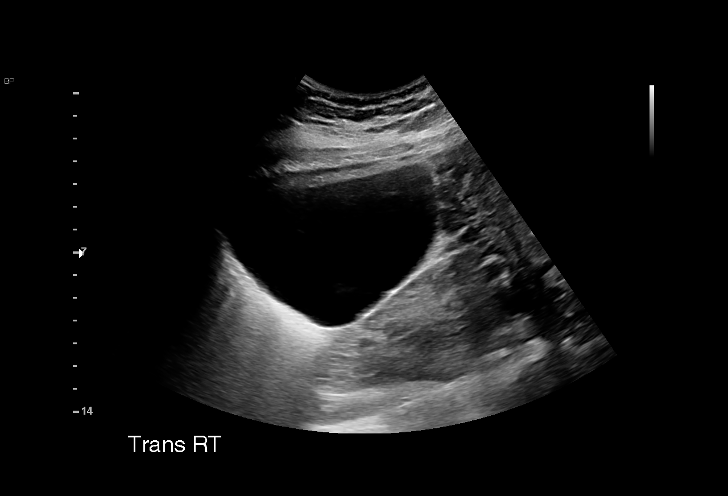
[im 18/28]
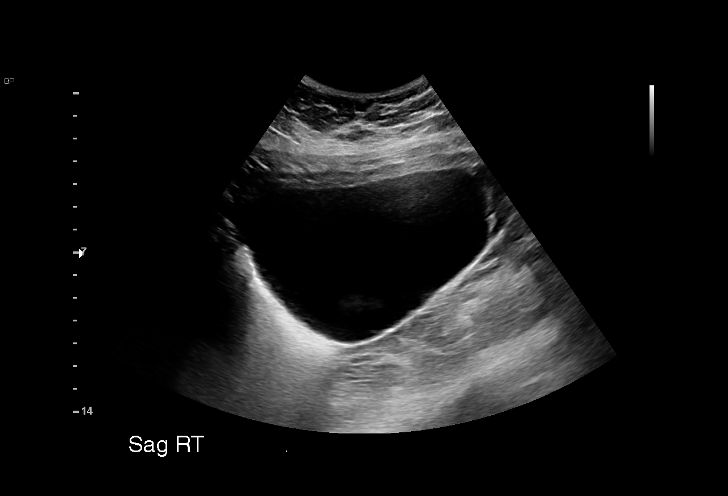
[im 20/28]
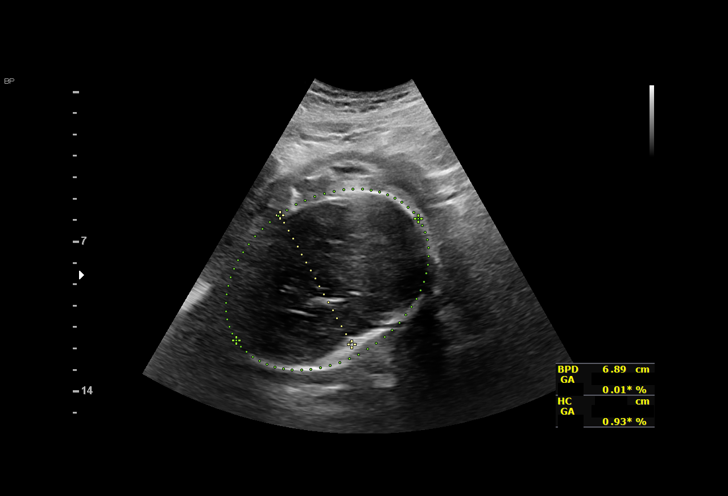
[im 22/28]
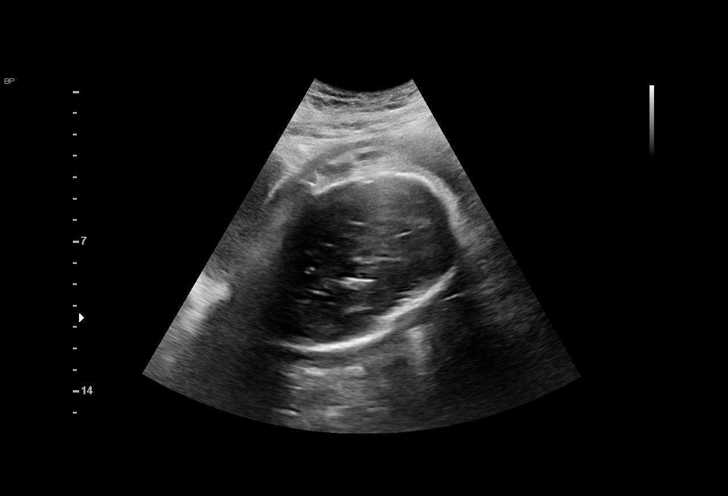
[im 24/28]
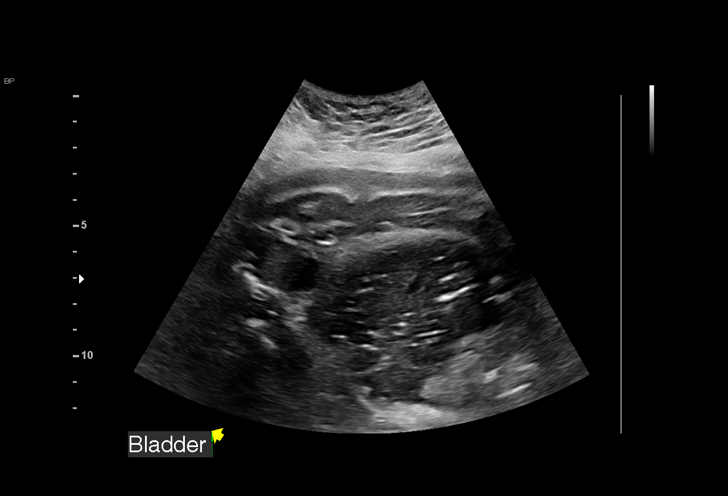
[im 26/28]
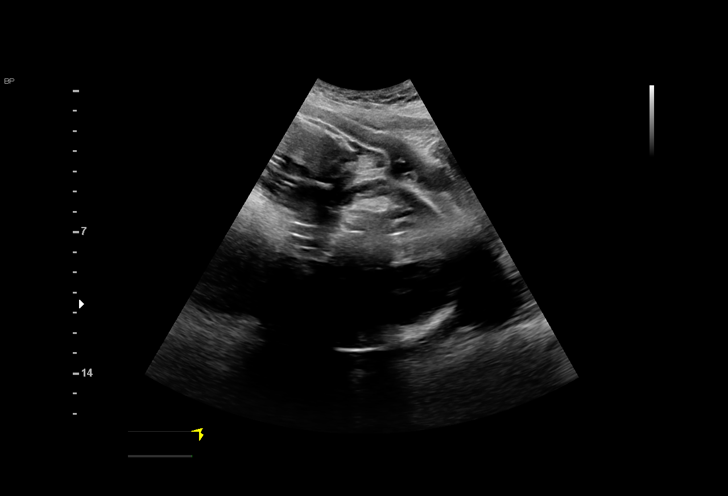
[im 28/28]
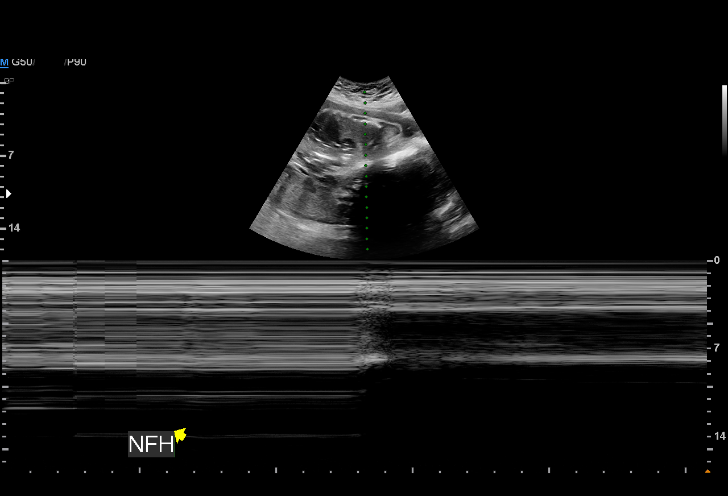

[14 of 28 positions shown; findings below may reference images not displayed]

[REDACTED]care

Indications

 Maternal care for known or suspected poor
 fetal growth, third trimester, not applicable or
 unspecified IUGR
 Fetal demise greater than 22 weeks
 Obesity complicating pregnancy, third
 trimester
 Heart disease in mother affecting pregnancy
 in third trimester
 Ovarian cyst (simple)
 LR NIPS, Mat 21 Neg
 Smoking complicating pregnancy,
 unspecified pregnancy
 32 weeks gestation of pregnancy
Fetal Evaluation

 Num Of Fetuses:         1
 Cardiac Activity:       Absent
 Presentation:           Cephalic
 Placenta:               Posterior Fundal
 P. Cord Insertion:      Previously Visualized

 Amniotic Fluid
 AFI FV:      Within normal limits

 AFI Sum(cm)     %Tile       Largest Pocket(cm)
 12.15           33

 RUQ(cm)       RLQ(cm)       LUQ(cm)        LLQ(cm)

OB History

 Blood Type:   B-
 Gravidity:    1
Gestational Age

 LMP:           32w 4d        Date:  07/29/20                 EDD:   05/05/21
 Best:          32w 4d     Det. By:  LMP  (07/29/20)          EDD:   05/05/21
Cervix Uterus Adnexa

 Cervix
 Not visualized (advanced GA >36wks)

 Uterus
 Normal shape and size.

 Right Ovary
 Simple cyst, measuring 11.1 x 7.0 x 10.0 cm

 Left Ovary
 Not visualized.
Impression

 G1 P0 at 32w 4d gestation with severe fetal growth
 restriction. Patient returned for antenatal testing. She was
 seen on 03/10/21 by Dr. Yamira who discussed with me after
 her visit. A plan was in place for the patient to get rescue
 course of steroids today and tomorrow and delivery on
 03/17/21.
 Patient reports decreased fetal movements for 2 days (had
 "flutters").
 On ultrasound, unfortunately, no fetal heart activity was seen.
 Cephalic presentation. Large simple right ovarian cyst is seen
 again, which is essentially unchanged from previous
 ultrasound.
 I counseled the couple on the findings and discussed
 induction of labor. Patient wants to be delivered today.
 Patient is understandably extremely upset and crying.
 I discussed with Dr. Lyman (Ob Attending). Patient will be
 going over to L&D for induction of labor.
 Her medical history is significant for unspecified
 cardiomyopathy. Echocardiography performed on 03/06/21
 showed improvement in LVEF (50% to 55%). Patient is being
 followed by our cardiologist and she takes metoprolol.
 Since she does not have severe symptoms including
 shortness of breath with moderate activities, she should
 tolerate labor without symptoms.
Recommendations

 -Induction of labor today.
 -Autopsy to be discussed.
 -Fetal karyotype/microarray (DEEQA RAYAAN).
 -Van (KB) screening on admission.
 -Placental pathology.
                 Etoil, Benrabah

## 2023-10-03 ENCOUNTER — Ambulatory Visit: Payer: Self-pay

## 2023-10-05 ENCOUNTER — Ambulatory Visit: Admitting: Family Medicine

## 2023-10-05 VITALS — BP 117/75 | HR 66 | Ht 71.0 in | Wt 343.1 lb

## 2023-10-05 DIAGNOSIS — Z13228 Encounter for screening for other metabolic disorders: Secondary | ICD-10-CM

## 2023-10-05 DIAGNOSIS — R252 Cramp and spasm: Secondary | ICD-10-CM

## 2023-10-05 NOTE — Progress Notes (Unsigned)
    SUBJECTIVE:   CHIEF COMPLAINT / HPI:   Weight loss follow up. Has been consistently trying diet and exercise since 05/2023 without significant changes. Was interested in GLP-1, but it does not appear on her med list currently. Last A1c was 5.3.  Patient also with ongoing back pain.  Most frequently occurs as a sharp pain below the band of her bra which radiates downward in the midline.  She denies any nerve pain or sciatica symptoms.  No loss of sensation or strength.  PERTINENT  PMH / PSH: Nonischemic cardiomyopathy, cardiomegaly, HFrEF, obesity  OBJECTIVE:   BP 117/75   Pulse 66   Ht 5' 11 (1.803 m)   Wt (!) 343 lb 2 oz (155.6 kg)   SpO2 100%   BMI 47.86 kg/m   General: A&O, NAD HEENT: No sign of trauma, EOM grossly intact Cardiac: RRR, no m/r/g Respiratory: CTAB, normal WOB, no w/c/r MSK: NTTP BLE, no pitting edema of the BLE.  Osteopathic structural exam reveals T12 E SRRR with associated muscular tissue texture changes.  Patient also with bilateral tender points of L3 spinous processes.  Straight leg test negative bilaterally.  5 out of 5 strength bilaterally at all 3 major joints in the lower extremity, no loss of sensation.  ASSESSMENT/PLAN:   Assessment & Plan Muscle cramps - BMP plus magnesium testing today, follow-up as appropriate -OMT of thoracic spine dysfunction with 100% resolution. -Recommend 1 week follow-up for further OMT Encounter for screening for metabolic disorder - Repeat A1c in office today -Given patient's complicated cardiac history recommended clearance by cardiology prior to proceeding with weight loss therapy   Lucie Pinal, DO [facility]

## 2023-10-05 NOTE — Patient Instructions (Addendum)
 It was wonderful to see you today!  Today we discussed your weight loss goals as well as your low back pain.  For your low back pain I would recommend coming back in 1 week for further OMT treatment.  In the meantime please look into getting a microwavable heat pack for muscle relaxation.  For your weight loss because of your significant heart problem and reduced heart function we will need to have clearance from your cardiologist before we can prescribe any medications.  I recommend making an appointment with your cardiologist as soon as possible and asking her of the following questions: What level of exercise is safe? What type of calorie/salt and fluid restrictions you should have? Is it safe for you to use a GLP-1 considering your heart condition?  Once you have an answer to these questions please make a follow-up appointment with me to further discuss your weight loss.  If there are any abnormalities on the lab work done today I will give you a phone call to discuss next steps.  If all your lab results are normal they will be available within MyChart in the next 24 to 48 hours.   Please call [phone] with any questions about today's appointment.   If you need any additional refills, please call your pharmacy before calling the office.  Lucie Pinal, DO Family Medicine

## 2023-10-06 LAB — BASIC METABOLIC PANEL WITH GFR
BUN/Creatinine Ratio: 10 (ref 9–23)
BUN: 9 mg/dL (ref 6–20)
CO2: 23 mmol/L (ref 20–29)
Calcium: 9.5 mg/dL (ref 8.7–10.2)
Chloride: 103 mmol/L (ref 96–106)
Creatinine, Ser: 0.9 mg/dL (ref 0.57–1.00)
Glucose: 71 mg/dL (ref 70–99)
Potassium: 4.4 mmol/L (ref 3.5–5.2)
Sodium: 140 mmol/L (ref 134–144)
eGFR: 92 mL/min/1.73 (ref >59)

## 2023-10-06 LAB — MAGNESIUM: Magnesium: 2.2 mg/dL (ref 1.6–2.3)

## 2023-10-14 ENCOUNTER — Ambulatory Visit
Admission: RE | Admit: 2023-10-14 | Discharge: 2023-10-14 | Disposition: A | Discharge status: Other Acute Inpatient Hospital | Source: Ambulatory Visit | Attending: Family Medicine | Admitting: Family Medicine

## 2023-10-14 ENCOUNTER — Other Ambulatory Visit: Payer: Self-pay

## 2023-10-14 VITALS — BP 98/57 | HR 87 | Temp 98.2°F | Resp 16

## 2023-10-14 DIAGNOSIS — M546 Pain in thoracic spine: Secondary | ICD-10-CM

## 2023-10-14 DIAGNOSIS — I509 Heart failure, unspecified: Secondary | ICD-10-CM | POA: Diagnosis not present

## 2023-10-14 DIAGNOSIS — R079 Chest pain, unspecified: Secondary | ICD-10-CM

## 2023-10-14 NOTE — Discharge Instructions (Addendum)
 Please go to the exam room for further evaluation of your chest pain

## 2023-10-14 NOTE — ED Provider Notes (Signed)
 [facility]    CSN: [id number] Arrival date & time: 10/14/23  1257      History   Chief Complaint Chief Complaint  Patient presents with   Back Pain   Chest Pain    HPI Jamie Gates is a 23 y.o. female with a complex past medical history including cardiomyopathy with CHF with reduced ejection fraction of 40%, morbid obesity, migraines who presents for back pain and chest pain.  Patient reports a couple hours ago while at home she developed a midsternal chest pain that radiated through towards her back.  It was associated with vomiting.  She reports it seemed to have resolved and 1 hour later returned the same presentation midsternal radiating to her back with another episode of vomiting.  She denies any shortness of breath, dizziness, palpitations, diaphoresis.  During intake at clinic she reports the chest pain returned and then resolved again.  She denies any lower extremity swelling or orthopnea.  Does not weigh herself daily.  She has been off of her metoprolol  for at least a month for unclear reasons.  Does report a family history of her aunt passing away of heart failure in her 30s.  She also reports mid back pain for 2 days.  Denies any known injury or inciting event.  States sometimes the pain will radiate slightly down to the lumbar area but otherwise remains mid thoracic region.  No numbness/tingling/weakness of her upper or lower extremities.  No bowel or bladder issues.  Did recently have a lumbar x-ray in June that did not show any abnormalities.  She has been doing ice, stretching, IcyHot for symptoms with minimal improvement.  No other concerns at this time.  Back Pain Associated symptoms: chest pain   Chest Pain Associated symptoms: back pain     Past Medical History:  Diagnosis Date   Abnormal antibody titer (anti-Lewis antibodies) 09/30/2020   Adjustment disorder with depressed mood 09/30/2020   Cardiomegaly 12/18/2012   still working with cardiology-esp  now with pregnancy   Cardiomyopathy, nonischemic (HCC) 02/05/2021   Chlamydia 09/23/2018   Treated 09/23/2018   Gonorrhea 09/23/2018   Treated 09/23/2018   Heart failure with reduced ejection fraction (HCC) 01/05/2022   Her echo in 03/2021 showed low normal EF.  Recent echo back down mildy depressed 40-45% 11/25/2021.    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   Migraines    Mitral regurgitation    Morbid obesity (HCC)    Ovarian cyst    Pregnancy induced hypertension     Patient Active Problem List   Diagnosis Date Noted   Mood disorder (HCC) 07/16/2023   Encounter for initial prescription of implantable subdermal contraceptive 05/29/2022   Nexplanon  insertion 05/29/2022   GBS (group B Streptococcus carrier), +RV culture, currently pregnant 05/14/2022   Red Chart Patient 04/21/2022   BMI 45.0-49.9, adult (HCC) 03/23/2022   Anemia of pregnancy 03/10/2022   Atypical squamous cell changes of undetermined significance (ASCUS) with negative high risk human papilloma virus (HPV) pap 01/05/2022 01/08/2022   Heart failure with reduced ejection fraction (HCC) 01/05/2022   Supervision of high risk pregnancy, antepartum 12/10/2021   History of maternal cardiomyopathy, currently pregnant 11/06/2021   Vaginal delivery 03/14/2021   History of IUFD at 32 weeks 03/11/2021   Mitral regurgitation of mother during pregnancy 03/11/2021   Cardiomyopathy, nonischemic (HCC) 02/05/2021   Rh negative state in antepartum period 01/07/2021   Adjustment disorder with depressed mood 09/30/2020   Abnormal antibody titer (anti-Lewis antibodies) 09/30/2020   HSV  infection 02/13/2020   Right ovarian cyst 04/13/2018   Cardiomegaly 12/18/2012   Maternal morbid obesity, antepartum (HCC) 12/04/2010    Past Surgical History:  Procedure Laterality Date   NEXPLANON  TRAY  05/29/2022   TYMPANOSTOMY TUBE PLACEMENT      OB History     Gravida  2   Para  2   Term  1   Preterm  1   AB  0   Living  1      SAB  0    IAB  0   Ectopic  0   Multiple  0   Live Births  1            Home Medications    Prior to Admission medications   Medication Sig Start Date End Date Taking? Authorizing Provider  amLODipine  (NORVASC ) 2.5 MG tablet Take 1 tablet (2.5 mg total) by mouth daily. Patient not taking: Reported on 07/16/2023 03/26/23   Tobb, Kardie, DO  escitalopram  (LEXAPRO ) 5 MG tablet Take 1 tablet (5 mg total) by mouth daily. 07/16/23   Joshua Domino, DO  etonogestrel  (NEXPLANON ) 68 MG IMPL implant 1 each by Subdermal route once.    [provider]  methocarbamol  (ROBAXIN ) 500 MG tablet Take 1 tablet (500 mg total) by mouth 2 (two) times daily. 08/18/23   Raspet, Erin K, PA-C  metoprolol  succinate (TOPROL  XL) 25 MG 24 hr tablet Take 1 tablet (25 mg total) by mouth daily. 05/27/23   Alba Sharper, MD    Family History Family History  Problem Relation Age of Onset   Raynaud syndrome Mother    Diabetes Father    Hyperlipidemia Father    Hypertension Father    Asthma Neg Hx    Stroke Neg Hx     Social History Social History   Tobacco Use   Smoking status: Former    Current packs/day: 0.00    Types: Cigars, Cigarettes    Quit date: 09/03/2020    Years since quitting: 3.1    Passive exposure: Past   Smokeless tobacco: Never  Vaping Use   Vaping status: Never Used  Substance Use Topics   Alcohol use: No   Drug use: Not Currently    Types: Marijuana     Allergies   Patient has no known allergies.   Review of Systems Review of Systems  Cardiovascular:  Positive for chest pain.  Musculoskeletal:  Positive for back pain.     Physical Exam Triage Vital Signs ED Triage Vitals  Encounter Vitals Group     BP 10/14/23 1307 (!) 98/57     Girls Systolic BP Percentile --      Girls Diastolic BP Percentile --      Boys Systolic BP Percentile --      Boys Diastolic BP Percentile --      Pulse Rate 10/14/23 1307 87     Resp 10/14/23 1307 16     Temp 10/14/23 1307 98.2 F  (36.8 C)     Temp Source 10/14/23 1307 Oral     SpO2 10/14/23 1307 94 %     Weight --      Height --      Head Circumference --      Peak Flow --      Pain Score 10/14/23 1306 7     Pain Loc --      Pain Education --      Exclude from Growth Chart --    No data  found.  Updated Vital Signs BP (!) 98/57   Pulse 87   Temp 98.2 F (36.8 C) (Oral)   Resp 16   SpO2 94%   Visual Acuity Right Eye Distance:   Left Eye Distance:   Bilateral Distance:    Right Eye Near:   Left Eye Near:    Bilateral Near:     Physical Exam Vitals and nursing note reviewed.  Constitutional:      General: She is not in acute distress.    Appearance: She is obese. She is not ill-appearing, toxic-appearing or diaphoretic.  HENT:     Head: Normocephalic and atraumatic.  Eyes:     Pupils: Pupils are equal, round, and reactive to light.  Cardiovascular:     Rate and Rhythm: Normal rate and regular rhythm.     Heart sounds: Normal heart sounds.  Pulmonary:     Effort: Pulmonary effort is normal.     Breath sounds: Normal breath sounds.  Musculoskeletal:     Thoracic back: Tenderness present. No swelling, edema, deformity, signs of trauma, lacerations, spasms or bony tenderness. Normal range of motion. No scoliosis.     Lumbar back: Normal. Negative right straight leg raise test and negative left straight leg raise test.       Back:     Right lower leg: No edema.     Left lower leg: No edema.     Comments: Strength 5 out of 5 bilateral upper and lower extremities.  Negative straight leg raise bilaterally  Skin:    General: Skin is warm and dry.  Neurological:     General: No focal deficit present.     Mental Status: She is alert and oriented to person, place, and time.  Psychiatric:        Mood and Affect: Mood normal.        Behavior: Behavior normal.      UC Treatments / Results  Labs (all labs ordered are listed, but only abnormal results are displayed) Labs Reviewed - No data to  display  EKG   Radiology No results found.  Procedures ED EKG  Date/Time: 10/14/2023 1:32 PM  Performed by: Loreda Myla SAUNDERS, NP Authorized by: Loreda Myla SAUNDERS, NP   ECG interpreted by ED Physician in the absence of a cardiologist: no   Interpretation:    Interpretation: normal   Rate:    ECG rate:  70   ECG rate assessment: normal   Rhythm:    Rhythm: sinus rhythm   Ectopy:    Ectopy: none   QRS:    QRS axis:  Normal ST segments:    ST segments:  Normal T waves:    T waves: normal    (including critical care time)  Medications Ordered in UC Medications - No data to display  Initial Impression / Assessment and Plan / UC Course  I have reviewed the triage vital signs and the nursing notes.  Pertinent labs & imaging results that were available during my care of the patient were reviewed by me and considered in my medical decision making (see chart for details).     Reviewed exam and symptoms with patient.  EKG shows no acute ST-T wave changes.  Discussed with patient her chest pain episode is concerning giving her history of cardiomyopathy/CHF and that she has been off of her beta-blocker for at least a month.  Given her presentation and history I advise she go to the emergency room for further evaluation of  the symptoms.  Discussed back pain likely musculoskeletal but will defer treatment to ER when she has been evaluated for her cardiac complaints.  Patient is in agreement with plan and will go POV to the emergency room.  She denies chest pain at time of discharge. Final Clinical Impressions(s) / UC Diagnoses   Final diagnoses:  Chest pain, unspecified type  Congestive heart failure, unspecified HF chronicity, unspecified heart failure type (HCC)  Acute right-sided thoracic back pain     Discharge Instructions      Please go to the exam room for further evaluation of your chest pain    ED Prescriptions   None    PDMP not reviewed this encounter.   Loreda Myla SAUNDERS, NP 10/14/23 1334

## 2023-10-14 NOTE — ED Triage Notes (Addendum)
 Pt c/o right mid back pain that radiates to mid spinal areax2d. Pt states if she puts an ice pack in that area then the pain radiates to the lower right back area. Pt denies injury or lifting anything heavy. Pt has numbness in the mid spinal area. Pt has tried icy hot, ice pack, stretching, tried moving around, but none of those have helped. PT states when she sits or lays down then the pain is worse. Pt states at 1000 and 1100 today she had non radiating midsternal chest pain and vomited twice during those episodes. Pt states the chest pain is coming back just now

## 2023-10-14 NOTE — ED Notes (Signed)
 Patient is being discharged from the Urgent Care and sent to the Emergency Department via POV . Per Myla Bold, NP, patient is in need of higher level of care. Patient is aware and verbalizes understanding of plan of care.  Vitals:   10/14/23 1307  BP: (!) 98/57  Pulse: 87  Resp: 16  Temp: 98.2 F (36.8 C)  SpO2: 94%

## 2023-10-16 ENCOUNTER — Emergency Department (HOSPITAL_COMMUNITY)
Admission: EM | Admit: 2023-10-16 | Discharge: 2023-10-17 | Disposition: A | Discharge status: Home / Self Care | Attending: Emergency Medicine | Admitting: Emergency Medicine

## 2023-10-16 ENCOUNTER — Encounter (HOSPITAL_COMMUNITY): Payer: Self-pay

## 2023-10-16 ENCOUNTER — Other Ambulatory Visit: Payer: Self-pay

## 2023-10-16 ENCOUNTER — Emergency Department (HOSPITAL_COMMUNITY)

## 2023-10-16 DIAGNOSIS — I509 Heart failure, unspecified: Secondary | ICD-10-CM | POA: Insufficient documentation

## 2023-10-16 DIAGNOSIS — R079 Chest pain, unspecified: Secondary | ICD-10-CM | POA: Insufficient documentation

## 2023-10-16 DIAGNOSIS — D72829 Elevated white blood cell count, unspecified: Secondary | ICD-10-CM | POA: Diagnosis not present

## 2023-10-16 DIAGNOSIS — M546 Pain in thoracic spine: Secondary | ICD-10-CM | POA: Diagnosis not present

## 2023-10-16 DIAGNOSIS — R0789 Other chest pain: Secondary | ICD-10-CM | POA: Diagnosis not present

## 2023-10-16 DIAGNOSIS — G8929 Other chronic pain: Secondary | ICD-10-CM | POA: Diagnosis not present

## 2023-10-16 DIAGNOSIS — M549 Dorsalgia, unspecified: Secondary | ICD-10-CM | POA: Diagnosis not present

## 2023-10-16 LAB — CBC
HCT: 36.8 % (ref 36.0–46.0)
Hemoglobin: 11.5 g/dL — ABNORMAL LOW (ref 12.0–15.0)
MCH: 27.1 pg (ref 26.0–34.0)
MCHC: 31.3 g/dL (ref 30.0–36.0)
MCV: 86.6 fL (ref 80.0–100.0)
Platelets: 260 K/uL (ref 150–400)
RBC: 4.25 MIL/uL (ref 3.87–5.11)
RDW: 14.6 % (ref 11.5–15.5)
WBC: 10.6 K/uL — ABNORMAL HIGH (ref 4.0–10.5)
nRBC: 0 % (ref 0.0–0.2)

## 2023-10-16 LAB — BASIC METABOLIC PANEL WITH GFR
Anion gap: 9 (ref 5–15)
BUN: 15 mg/dL (ref 6–20)
CO2: 24 mmol/L (ref 22–32)
Calcium: 8.8 mg/dL — ABNORMAL LOW (ref 8.9–10.3)
Chloride: 106 mmol/L (ref 98–111)
Creatinine, Ser: 1.09 mg/dL — ABNORMAL HIGH (ref 0.44–1.00)
GFR, Estimated: >60 mL/min (ref >60)
Glucose, Bld: 104 mg/dL — ABNORMAL HIGH (ref 70–99)
Potassium: 3.9 mmol/L (ref 3.5–5.1)
Sodium: 139 mmol/L (ref 135–145)

## 2023-10-16 LAB — TROPONIN I (HIGH SENSITIVITY): Troponin I (High Sensitivity): 3 ng/L (ref <18)

## 2023-10-16 LAB — HCG, SERUM, QUALITATIVE: Preg, Serum: NEGATIVE

## 2023-10-16 NOTE — ED Triage Notes (Addendum)
 Patient reports chest pain and back pain. States she often has back pain and it been ongoing for months. States now the pain radiates to her chest. Patient states the pain is pulsating in her back. Rates chest pain 10/10 and back pain 10/10. States she feels nauseous but has not thrown up. No blood thinners. States she tried stretching with no relief.

## 2023-10-16 NOTE — ED Notes (Signed)
 Patient states tylenol  and ibuprofen  does not help.

## 2023-10-17 LAB — TROPONIN I (HIGH SENSITIVITY): Troponin I (High Sensitivity): 4 ng/L (ref <18)

## 2023-10-17 MED ORDER — DEXAMETHASONE SODIUM PHOSPHATE 10 MG/ML IJ SOLN
10.0000 mg | Freq: Once | INTRAMUSCULAR | Status: AC
  Administered 2023-10-17: 10 mg via INTRAMUSCULAR
  Filled 2023-10-17: qty 1

## 2023-10-17 MED ORDER — CYCLOBENZAPRINE HCL 10 MG PO TABS: 10.0000 mg | ORAL_TABLET | Freq: Two times a day (BID) | ORAL | 0 refills | Status: DC | PRN

## 2023-10-17 NOTE — ED Provider Notes (Signed)
 [place] EMERGENCY DEPARTMENT AT [facility] Provider Note   CSN: [id number] Arrival date & time: 10/16/23  2128     Patient presents with: Chest Pain   Jamie Gates is a 23 y.o. female with medical history of heart failure with reduced ejection fraction, cardiomyopathy, migraines, mitral regurgitation.  Patient presents to ED for evaluation of thoracic back pain, chest pain.  Reports that for the last 4 months, ever since giving birth her son, she has had intermittent thoracic back pain.  Reports that she typically takes Tylenol , Flexeril  at home.  Reports that these medications are now not actively resolving her pain.  Reports that she has been seen by her PCP for this issue and she is set to begin some kind of therapy in the next few weeks.  Reports that she is set to see her PCP on Monday again for her back pain.  Reports that over the last 4 months her back pain is progressively increased.  States that typically her back pain is resolved with steroids however she is concerned about the amount of steroid she is getting.  She states that 3 days ago she began to have chest pain after eating food and subsequently had to episodes of nausea and vomiting.  Reports that she has had no episodes of chest pain since this event.  She denies any current chest pain or shortness of breath.  She denies any dysuria, flank pain, nausea or vomiting.  She denies any bowel or bladder incontinence, groin numbness or distal weakness.  She denies fevers, IV drug use.  Denies abdominal pain.   Chest Pain Associated symptoms: back pain        Prior to Admission medications   Medication Sig Start Date End Date Taking? Authorizing Provider  cyclobenzaprine  (FLEXERIL ) 10 MG tablet Take 1 tablet (10 mg total) by mouth 2 (two) times daily as needed for muscle spasms. 10/17/23  Yes Ruthell Lonni FALCON, PA-C  amLODipine  (NORVASC ) 2.5 MG tablet Take 1 tablet (2.5 mg total) by mouth daily. Patient not  taking: Reported on 07/16/2023 03/26/23   Tobb, Kardie, DO  escitalopram  (LEXAPRO ) 5 MG tablet Take 1 tablet (5 mg total) by mouth daily. 07/16/23   Joshua Domino, DO  etonogestrel  (NEXPLANON ) 68 MG IMPL implant 1 each by Subdermal route once.    [provider]  methocarbamol  (ROBAXIN ) 500 MG tablet Take 1 tablet (500 mg total) by mouth 2 (two) times daily. 08/18/23   Raspet, Erin K, PA-C  metoprolol  succinate (TOPROL  XL) 25 MG 24 hr tablet Take 1 tablet (25 mg total) by mouth daily. 05/27/23   Alba Sharper, MD    Allergies: Patient has no known allergies.    Review of Systems  Cardiovascular:  Positive for chest pain.  Musculoskeletal:  Positive for back pain.  All other systems reviewed and are negative.   Updated Vital Signs BP (!) 124/106 (BP Location: Right Arm)   Pulse 66   Temp 98 F (36.7 C)   Resp 16   Ht 6' (1.829 m)   Wt (!) 154.2 kg   SpO2 100%   BMI 46.11 kg/m   Physical Exam Vitals and nursing note reviewed.  Constitutional:      General: She is not in acute distress.    Appearance: She is well-developed.  HENT:     Head: Normocephalic and atraumatic.  Eyes:     Conjunctiva/sclera: Conjunctivae normal.  Cardiovascular:     Rate and Rhythm: Normal rate and regular  rhythm.     Heart sounds: No murmur heard. Pulmonary:     Effort: Pulmonary effort is normal. No respiratory distress.     Breath sounds: Normal breath sounds.  Abdominal:     Palpations: Abdomen is soft.     Tenderness: There is no abdominal tenderness.     Comments: Abdomen soft and nontender, negative Murphy sign  Musculoskeletal:        General: No swelling.     Cervical back: Neck supple.       Back:     Right lower leg: No edema.     Left lower leg: No edema.  Skin:    General: Skin is warm and dry.     Capillary Refill: Capillary refill takes less than 2 seconds.  Neurological:     Mental Status: She is alert.     Comments: 5 out of 5 strength bilateral lower extremities   Psychiatric:        Mood and Affect: Mood normal.     (all labs ordered are listed, but only abnormal results are displayed) Labs Reviewed  BASIC METABOLIC PANEL WITH GFR - Abnormal; Notable for the following components:      Result Value   Glucose, Bld 104 (*)    Creatinine, Ser 1.09 (*)    Calcium 8.8 (*)    All other components within normal limits  CBC - Abnormal; Notable for the following components:   WBC 10.6 (*)    Hemoglobin 11.5 (*)    All other components within normal limits  HCG, SERUM, QUALITATIVE  TROPONIN I (HIGH SENSITIVITY)  TROPONIN I (HIGH SENSITIVITY)    EKG: None  Radiology: DG Chest 2 View Result Date: 10/16/2023 CLINICAL DATA:  Chest and back pain EXAM: CHEST - 2 VIEW COMPARISON:  08/22/2023 FINDINGS: The heart size and mediastinal contours are within normal limits. Both lungs are clear. The visualized skeletal structures are unremarkable. No pneumothorax. IMPRESSION: No active cardiopulmonary disease. Electronically Signed   By: Franky Crease M.D.   On: 10/16/2023 23:09    Procedures   Medications Ordered in the ED  dexamethasone  (DECADRON ) injection 10 mg (has no administration in time range)    Medical Decision Making Amount and/or Complexity of Data Reviewed Labs: ordered. Radiology: ordered. ECG/medicine tests: ordered.  Risk Prescription drug management.   This is a 23 year old female presenting for 3 back pain which she states is been ongoing for the last 4 months.  On exam, she is afebrile and nontachycardic.  Her lung sounds are clear bilaterally, she is not hypoxic.  Abdomen soft and compressible with no tenderness, negative Murphy sign.  No CVA tenderness bilaterally.  Neurological examination at baseline.  No edema to bilateral lower extremities.  Overall patient nontoxic in appearance, resting comfortably in the bed.  Patient recommended she had it in triage include CBC, BMP, troponin x 2, hCG, chest x-ray and EKG.  Patient CBC  with leukocytosis 10.6 however patient is afebrile and nontachycardic.  Hemoglobin baseline.  Metabolic panel with no electrolyte derangement, slightly elevated creatinine 1.09.  0.90 12 days ago.  Troponin flat 3, delta 4.  Chest x-ray unremarkable.  EKG is nonischemic.  At this time, patient denies any active chest pain.  Reassuring workup.  Most likely patient pain due to ongoing chronic thoracic back pain that she has been experiencing for 4 months and she is seeing her PCP for.  Patient here requesting steroid shot and muscle relaxers.  Patient was given 10 mg IM  Decadron  shot, will be sent home with Flexeril .  Was advised to follow-up with her PCP regarding her back pain.  Was given return precautions and she voiced understanding.  Stable to discharge.    Final diagnoses:  Chronic right-sided thoracic back pain    ED Discharge Orders          Ordered    cyclobenzaprine  (FLEXERIL ) 10 MG tablet  2 times daily PRN        10/17/23 0327               Ruthell Lonni FALCON, PA-C 10/17/23 9667    Midge Golas, MD 10/17/23 [phone]

## 2023-10-17 NOTE — Discharge Instructions (Addendum)
 It was a pleasure taking part in your care.  As we discussed, your workup appears reassuring.  No evidence of stress on your heart.  EKG shows no evidence of ischemia.  Your chest x-ray does not show any signs of pneumonia.  You most likely are experiencing the back pain that you have been experiencing for the last 4 months.  Please continue taking medication such as Tylenol  at home.  You may begin taking Flexeril  10 mg twice a day as needed for back spasms.  Do not drive, operate machinery, drink on this medication.  Please follow-up with your PCP as we discussed.  Please return to the ED with new symptoms such as fevers, inability to control bowel or bladder, numbness in your groin or weakness in your legs.

## 2023-10-18 ENCOUNTER — Ambulatory Visit

## 2023-10-18 VITALS — BP 120/77 | HR 81 | Ht 72.0 in | Wt 345.4 lb

## 2023-10-18 DIAGNOSIS — R252 Cramp and spasm: Secondary | ICD-10-CM

## 2023-10-18 DIAGNOSIS — I428 Other cardiomyopathies: Secondary | ICD-10-CM | POA: Diagnosis not present

## 2023-10-18 DIAGNOSIS — F39 Unspecified mood [affective] disorder: Secondary | ICD-10-CM

## 2023-10-18 MED ORDER — ESCITALOPRAM OXALATE 5 MG PO TABS
5.0000 mg | ORAL_TABLET | Freq: Every day | ORAL | 1 refills | Status: AC
Start: 2023-10-18 — End: ?

## 2023-10-18 MED ORDER — TIZANIDINE HCL 4 MG PO TABS: 4.0000 mg | ORAL_TABLET | Freq: Three times a day (TID) | ORAL | 0 refills | Status: DC | PRN

## 2023-10-18 MED ORDER — METOPROLOL SUCCINATE ER 25 MG PO TB24: 25.0000 mg | ORAL_TABLET | Freq: Every day | ORAL | 0 refills | Status: AC

## 2023-10-18 NOTE — Assessment & Plan Note (Signed)
 Refilled patient's Lexapro  5 mg.

## 2023-10-18 NOTE — Patient Instructions (Addendum)
 Stop the cyclobenzaprine  (flexaril). We will switch you to a different muscle relaxer that is less sedating called Tizanidine .   I have sent in refills for your metoprolol  and your Lexapro .

## 2023-10-18 NOTE — Progress Notes (Signed)
    SUBJECTIVE:   CHIEF COMPLAINT / HPI:   Back pain follow-up  Patient has chronic thoracic back pain on the right side.  She states this initially began 3 months after having her son.  This was roughly in June 2024.  The pain has gotten worse over time and now Tylenol  and ibuprofen  do not help her pain.  She went to the emergency department on 10/16/2023 for her back pain.  They gave her muscle relaxers which she states is the 1 medication that really helps her back pain.  When she applies ice to her back the pain then radiates down into her lower back.  She has also received steroid shots at the urgent care for her back pain which she states gave her relief for about 3 days.  She does not want to have to continually seek care for her back pain.  However, she is concerned about constantly using the muscle relaxers due to their sedating effects especially because she is soon to be starting a new job.  She is worried about taking the medication during the daytime.  Patient previously saw Dr. Cleotilde on 10/05/2023 and received OMT with significant reduction of her back pain.  She is requesting OMT again today.  Patient is also requesting refills on her metoprolol  succinate and escitalopram  today.  She states she has been unable to make it to the pharmacy for quite some time and therefore has not picked up her prescriptions.  She states she never tried the escitalopram , but feels like her mood would benefit from it.  PERTINENT  PMH / PSH: Nonischemic cardiomyopathy, HFrEF  OBJECTIVE:   BP 120/77   Pulse 81   Ht 6' (1.829 m)   Wt (!) 345 lb 6.4 oz (156.7 kg)   SpO2 100%   BMI 46.84 kg/m   General: Well-appearing female, no acute distress Cardiovascular: RRR, no M/R/G Respiratory: CTAB, normal work of breathing on room air MSK: Sensation, movement, and strength grossly intact in bilateral extremities. Osteopathic structural exam reveals T12 F SrRr. Tenderness to palpation over the right T12  transverse process.  Decreased thoracic range of motion and right rotation.  Patient also with tenderness over the L5 spinous process. Extremities: Warm, dry, no edema  ASSESSMENT/PLAN:   Assessment & Plan Muscle cramps OMT of thoracic spine dysfunction with marked improvement today.  Follow-up in 1-2 weeks for further OMT. Cardiomyopathy, nonischemic (HCC) Refilled patient's metoprolol  succinate 25 mg 24-hour for at this time. Mood disorder (HCC) Refilled patient's Lexapro  5 mg.       Jamie KANDICE Lee, DO  [facility]

## 2023-10-18 NOTE — Assessment & Plan Note (Signed)
 Refilled patient's metoprolol  succinate 25 mg 24-hour for at this time.

## 2023-11-01 ENCOUNTER — Ambulatory Visit (HOSPITAL_COMMUNITY)

## 2023-11-05 ENCOUNTER — Ambulatory Visit: Payer: Self-pay | Admitting: Family Medicine

## 2023-11-05 NOTE — Progress Notes (Deleted)
    SUBJECTIVE:   CHIEF COMPLAINT / HPI:   ***  PERTINENT  PMH / PSH: ***  OBJECTIVE:   There were no vitals taken for this visit.  ***  ASSESSMENT/PLAN:   Assessment & Plan      Lucie Pinal, DO [facility]

## 2023-11-18 ENCOUNTER — Other Ambulatory Visit (HOSPITAL_COMMUNITY)
Admission: RE | Admit: 2023-11-18 | Discharge: 2023-11-18 | Disposition: A | Discharge status: Home / Self Care | Source: Ambulatory Visit | Attending: Family Medicine | Admitting: Family Medicine

## 2023-11-18 ENCOUNTER — Ambulatory Visit: Admitting: Family Medicine

## 2023-11-18 ENCOUNTER — Encounter: Payer: Self-pay | Admitting: Family Medicine

## 2023-11-18 VITALS — BP 115/67 | HR 98 | Ht 72.0 in | Wt 342.1 lb

## 2023-11-18 DIAGNOSIS — Z113 Encounter for screening for infections with a predominantly sexual mode of transmission: Secondary | ICD-10-CM

## 2023-11-18 DIAGNOSIS — M549 Dorsalgia, unspecified: Secondary | ICD-10-CM | POA: Diagnosis not present

## 2023-11-18 MED ORDER — TIZANIDINE HCL 4 MG PO TABS: 4.0000 mg | ORAL_TABLET | Freq: Four times a day (QID) | ORAL | 0 refills | Status: AC | PRN

## 2023-11-18 NOTE — Progress Notes (Signed)
    SUBJECTIVE:   CHIEF COMPLAINT / HPI:   Fu for back pain. Recently saw Dr. Lennie for OMT treatment, also received trial of tizanidine .  Patient reports significant improvement in neck pain since initial appointment.  She reports that the use of heating pad daily after work has been the greatest help.  She denies any worsening of her pain and now feels the pain in most strongly in the center of her back on the right near her bra band.  Pain is only intermittently painful as opposed to constantly when she first saw me several months ago.  Patient also reports trichomonas exposure through recent sexual partner and requests testing. Denies current symptoms including pain, discharge, burning.   PERTINENT  PMH / PSH: Right sided upper back pain  OBJECTIVE:   BP 115/67   Pulse 98   Ht 6' (1.829 m)   Wt (!) 342 lb 2 oz (155.2 kg)   SpO2 100%   BMI 46.40 kg/m   Musculoskeletal:  Exam found Decreased ROM, Tissue texture changes, Tenderness to palpation, and Asymmetry of patient's  thorax Osteopathic Structural Exam:   Neck: tight muscle texture through the lower portion of the neck with no bony somatic dysfunction.   Thorax: boggy tissue texture on the L at the level of T9-10, T8 FRLSL  After verbal consent was obtained, patient was treated today with osteopathic manipulative medicine to the regions of the thorax using the techniques of muscle energy. Areas of compensation relating to her primary pain source also treated. Patient tolerated the procedure well with good objective and good subjective improvement in symptoms. Patient left the room in good condition. She was advised to stay well hydrated and that she may have some soreness following the procedure. If not improving or worsening, she will return to clinic. Home exercise program of stretches for levator scapulae, lats, neck, and traps discussed and demonstrated today. Patient will do these stretches BID to before the point of pain, and  will return for reevaluation  on a PRN basis.    ASSESSMENT/PLAN:   Assessment & Plan Upper back pain on right side -MET of the thoracic region with 100% reduction of dysfunction and subjective improvement of symptoms -reordered zanaflex  for muscle spasm relief -home exercises given and demonstrated.  Routine screening for STI (sexually transmitted infection) -self swab obtained today for trich, gc/chlamydia, yeast and bv -will follow up based on results.      Lucie Pinal, DO [facility]

## 2023-11-18 NOTE — Patient Instructions (Addendum)
 Today we did a therapy called OMT, which involves stretching and manipulating the body to relieve pressure and dysfunction.  Just like after a massage or workout, you may feel sore. Heat, ice and rest will help, but staying hydrated will also help.    I have also sent in a new prescription for a muscle relaxer to help relieve spasm during the day and improve your overall pain. Attached are some easy upper back stretches you can also do through out the day to prevent spasm.         Do each of these stretches for 15-20 seconds, three times a day.   Lucie Pinal, DO PGY-2, Family Medicine

## 2023-11-19 LAB — CERVICOVAGINAL ANCILLARY ONLY
Bacterial Vaginitis (gardnerella): POSITIVE — AB
Candida Glabrata: NEGATIVE
Candida Vaginitis: NEGATIVE
Chlamydia: NEGATIVE
Comment: NEGATIVE
Comment: NEGATIVE
Comment: NEGATIVE
Comment: NEGATIVE
Comment: NEGATIVE
Comment: NORMAL
Neisseria Gonorrhea: NEGATIVE
Trichomonas: POSITIVE — AB

## 2023-11-20 ENCOUNTER — Ambulatory Visit (HOSPITAL_COMMUNITY): Payer: Self-pay | Admitting: Family Medicine

## 2023-11-20 MED ORDER — METRONIDAZOLE 500 MG PO TABS: 500.0000 mg | ORAL_TABLET | Freq: Two times a day (BID) | ORAL | 0 refills | Status: AC
